# Patient Record
Sex: Female | Born: 1945
Health system: Southern US, Community
[De-identification: ages and names within clinical notes are randomized; demographics above are authoritative.]

## PROBLEM LIST (undated history)

## (undated) DIAGNOSIS — E785 Hyperlipidemia, unspecified: Secondary | ICD-10-CM

## (undated) DIAGNOSIS — Z87440 Personal history of urinary (tract) infections: Secondary | ICD-10-CM

## (undated) DIAGNOSIS — Z85828 Personal history of other malignant neoplasm of skin: Secondary | ICD-10-CM

## (undated) DIAGNOSIS — I10 Essential (primary) hypertension: Secondary | ICD-10-CM

## (undated) DIAGNOSIS — E669 Obesity, unspecified: Secondary | ICD-10-CM

## (undated) DIAGNOSIS — N8110 Cystocele, unspecified: Secondary | ICD-10-CM

## (undated) HISTORY — DX: Essential (primary) hypertension: I10

## (undated) HISTORY — DX: Personal history of other malignant neoplasm of skin: Z85.828

## (undated) HISTORY — DX: Personal history of urinary (tract) infections: Z87.440

## (undated) HISTORY — DX: Cystocele, unspecified: N81.10

## (undated) HISTORY — PX: OTHER SURGICAL HISTORY: SHX169

## (undated) HISTORY — DX: Obesity, unspecified: E66.9

## (undated) HISTORY — DX: Hyperlipidemia, unspecified: E78.5

---

## 2001-01-19 ENCOUNTER — Encounter: Payer: Self-pay | Admitting: Podiatry

## 2001-01-26 ENCOUNTER — Ambulatory Visit (HOSPITAL_COMMUNITY): Admission: RE | Admit: 2001-01-26 | Discharge: 2001-01-26 | Payer: Self-pay | Admitting: Podiatry

## 2001-01-26 ENCOUNTER — Encounter: Payer: Self-pay | Admitting: Podiatry

## 2001-03-24 ENCOUNTER — Encounter: Payer: Self-pay | Admitting: Internal Medicine

## 2001-03-24 ENCOUNTER — Ambulatory Visit (HOSPITAL_COMMUNITY): Admission: RE | Admit: 2001-03-24 | Discharge: 2001-03-24 | Payer: Self-pay | Admitting: Internal Medicine

## 2001-11-23 ENCOUNTER — Other Ambulatory Visit: Admission: RE | Admit: 2001-11-23 | Discharge: 2001-11-23 | Payer: Self-pay | Admitting: Dermatology

## 2002-07-21 ENCOUNTER — Encounter: Payer: Self-pay | Admitting: Internal Medicine

## 2002-07-21 ENCOUNTER — Ambulatory Visit (HOSPITAL_COMMUNITY): Admission: RE | Admit: 2002-07-21 | Discharge: 2002-07-21 | Payer: Self-pay | Admitting: Internal Medicine

## 2003-08-03 ENCOUNTER — Ambulatory Visit (HOSPITAL_COMMUNITY): Admission: RE | Admit: 2003-08-03 | Discharge: 2003-08-03 | Payer: Self-pay | Admitting: Family Medicine

## 2004-12-10 ENCOUNTER — Ambulatory Visit (HOSPITAL_COMMUNITY): Admission: RE | Admit: 2004-12-10 | Discharge: 2004-12-10 | Payer: Self-pay | Admitting: Family Medicine

## 2005-08-20 ENCOUNTER — Ambulatory Visit: Payer: Self-pay | Admitting: Family Medicine

## 2005-10-14 ENCOUNTER — Encounter: Payer: Self-pay | Admitting: Family Medicine

## 2006-01-21 ENCOUNTER — Ambulatory Visit (HOSPITAL_COMMUNITY): Admission: RE | Admit: 2006-01-21 | Discharge: 2006-01-21 | Payer: Self-pay | Admitting: Family Medicine

## 2006-06-06 ENCOUNTER — Ambulatory Visit (HOSPITAL_COMMUNITY): Admission: RE | Admit: 2006-06-06 | Discharge: 2006-06-06 | Payer: Self-pay | Admitting: Internal Medicine

## 2006-06-06 ENCOUNTER — Encounter: Payer: Self-pay | Admitting: Family Medicine

## 2006-06-06 ENCOUNTER — Ambulatory Visit: Payer: Self-pay | Admitting: Internal Medicine

## 2006-06-06 ENCOUNTER — Encounter (INDEPENDENT_AMBULATORY_CARE_PROVIDER_SITE_OTHER): Payer: Self-pay | Admitting: Specialist

## 2006-09-01 ENCOUNTER — Encounter: Payer: Self-pay | Admitting: Family Medicine

## 2006-09-01 ENCOUNTER — Other Ambulatory Visit: Admission: RE | Admit: 2006-09-01 | Discharge: 2006-09-01 | Payer: Self-pay | Admitting: Family Medicine

## 2006-09-01 ENCOUNTER — Ambulatory Visit: Payer: Self-pay | Admitting: Family Medicine

## 2006-10-15 ENCOUNTER — Ambulatory Visit: Payer: Self-pay | Admitting: Family Medicine

## 2007-01-23 ENCOUNTER — Ambulatory Visit (HOSPITAL_COMMUNITY): Admission: RE | Admit: 2007-01-23 | Discharge: 2007-01-23 | Payer: Self-pay | Admitting: Family Medicine

## 2007-08-06 ENCOUNTER — Encounter: Payer: Self-pay | Admitting: Family Medicine

## 2007-09-03 ENCOUNTER — Ambulatory Visit: Payer: Self-pay | Admitting: Family Medicine

## 2007-09-03 ENCOUNTER — Ambulatory Visit (HOSPITAL_COMMUNITY): Admission: RE | Admit: 2007-09-03 | Discharge: 2007-09-03 | Payer: Self-pay | Admitting: Family Medicine

## 2007-09-03 ENCOUNTER — Other Ambulatory Visit: Admission: RE | Admit: 2007-09-03 | Discharge: 2007-09-03 | Payer: Self-pay | Admitting: Family Medicine

## 2007-09-03 ENCOUNTER — Encounter: Payer: Self-pay | Admitting: Family Medicine

## 2007-11-09 ENCOUNTER — Ambulatory Visit: Payer: Self-pay | Admitting: Family Medicine

## 2007-11-10 ENCOUNTER — Encounter: Payer: Self-pay | Admitting: Family Medicine

## 2008-02-09 ENCOUNTER — Ambulatory Visit (HOSPITAL_COMMUNITY): Admission: RE | Admit: 2008-02-09 | Discharge: 2008-02-09 | Payer: Self-pay | Admitting: Family Medicine

## 2008-02-19 DIAGNOSIS — E785 Hyperlipidemia, unspecified: Secondary | ICD-10-CM | POA: Insufficient documentation

## 2008-02-19 DIAGNOSIS — N329 Bladder disorder, unspecified: Secondary | ICD-10-CM | POA: Insufficient documentation

## 2008-03-10 ENCOUNTER — Encounter: Payer: Self-pay | Admitting: Family Medicine

## 2008-03-10 LAB — CONVERTED CEMR LAB
Alkaline Phosphatase: 56 units/L (ref 39–117)
BUN: 20 mg/dL (ref 6–23)
Basophils Absolute: 0 10*3/uL (ref 0.0–0.1)
Basophils Relative: 1 % (ref 0–1)
Bilirubin, Direct: 0.1 mg/dL (ref 0.0–0.3)
CO2: 24 meq/L (ref 19–32)
Cholesterol: 223 mg/dL — ABNORMAL HIGH (ref 0–200)
Creatinine, Ser: 0.96 mg/dL (ref 0.40–1.20)
Eosinophils Absolute: 0.1 10*3/uL (ref 0.0–0.7)
Eosinophils Relative: 2 % (ref 0–5)
Glucose, Bld: 84 mg/dL (ref 70–99)
HDL: 50 mg/dL (ref >39)
Hemoglobin: 13.3 g/dL (ref 12.0–15.0)
Lymphs Abs: 1.9 10*3/uL (ref 0.7–4.0)
MCV: 99.5 fL (ref 78.0–100.0)
Monocytes Absolute: 0.4 10*3/uL (ref 0.1–1.0)
Monocytes Relative: 8 % (ref 3–12)
Neutro Abs: 2.5 10*3/uL (ref 1.7–7.7)
Neutrophils Relative %: 50 % (ref 43–77)
Potassium: 4.9 meq/L (ref 3.5–5.3)
Total Bilirubin: 0.6 mg/dL (ref 0.3–1.2)
Total CHOL/HDL Ratio: 4.5
Triglycerides: 103 mg/dL (ref <150)
WBC: 5 10*3/uL (ref 4.0–10.5)

## 2008-03-14 ENCOUNTER — Ambulatory Visit: Payer: Self-pay | Admitting: Family Medicine

## 2008-06-10 ENCOUNTER — Encounter: Payer: Self-pay | Admitting: Family Medicine

## 2008-06-13 LAB — CONVERTED CEMR LAB
ALT: 13 units/L (ref 0–35)
Alkaline Phosphatase: 50 units/L (ref 39–117)
Cholesterol: 162 mg/dL (ref 0–200)
Indirect Bilirubin: 0.5 mg/dL (ref 0.0–0.9)
Total Bilirubin: 0.6 mg/dL (ref 0.3–1.2)
Total Protein: 6.4 g/dL (ref 6.0–8.3)
VLDL: 19 mg/dL (ref 0–40)

## 2008-08-22 ENCOUNTER — Ambulatory Visit: Payer: Self-pay | Admitting: Family Medicine

## 2008-08-29 ENCOUNTER — Ambulatory Visit (HOSPITAL_COMMUNITY): Admission: RE | Admit: 2008-08-29 | Discharge: 2008-08-29 | Payer: Self-pay | Admitting: Family Medicine

## 2008-08-31 ENCOUNTER — Telehealth: Payer: Self-pay | Admitting: Family Medicine

## 2008-10-24 ENCOUNTER — Encounter: Payer: Self-pay | Admitting: Family Medicine

## 2008-11-02 ENCOUNTER — Encounter: Payer: Self-pay | Admitting: Family Medicine

## 2008-11-14 ENCOUNTER — Ambulatory Visit: Payer: Self-pay | Admitting: Family Medicine

## 2008-11-14 ENCOUNTER — Other Ambulatory Visit: Admission: RE | Admit: 2008-11-14 | Discharge: 2008-11-14 | Payer: Self-pay | Admitting: Family Medicine

## 2008-11-14 ENCOUNTER — Encounter: Payer: Self-pay | Admitting: Family Medicine

## 2008-11-14 LAB — CONVERTED CEMR LAB: OCCULT 1: NEGATIVE

## 2008-11-15 ENCOUNTER — Ambulatory Visit (HOSPITAL_COMMUNITY): Admission: RE | Admit: 2008-11-15 | Discharge: 2008-11-15 | Payer: Self-pay | Admitting: Family Medicine

## 2009-02-13 ENCOUNTER — Ambulatory Visit (HOSPITAL_COMMUNITY): Admission: RE | Admit: 2009-02-13 | Discharge: 2009-02-13 | Payer: Self-pay | Admitting: Family Medicine

## 2009-07-13 ENCOUNTER — Ambulatory Visit: Payer: Self-pay | Admitting: Family Medicine

## 2009-07-25 ENCOUNTER — Encounter: Payer: Self-pay | Admitting: Family Medicine

## 2009-07-27 DIAGNOSIS — E559 Vitamin D deficiency, unspecified: Secondary | ICD-10-CM | POA: Insufficient documentation

## 2009-07-27 LAB — CONVERTED CEMR LAB
Cholesterol: 202 mg/dL — ABNORMAL HIGH (ref 0–200)
Total CHOL/HDL Ratio: 3.7
Triglycerides: 108 mg/dL (ref <150)
VLDL: 22 mg/dL (ref 0–40)
Vit D, 25-Hydroxy: 24 ng/mL — ABNORMAL LOW (ref 30–89)

## 2009-08-26 HISTORY — PX: EYE SURGERY: SHX253

## 2009-09-12 ENCOUNTER — Encounter: Payer: Self-pay | Admitting: Family Medicine

## 2010-02-13 LAB — CONVERTED CEMR LAB
Cholesterol: 200 mg/dL (ref 0–200)
HDL: 56 mg/dL (ref >39)
LDL Cholesterol: 124 mg/dL — ABNORMAL HIGH (ref 0–99)
VLDL: 20 mg/dL (ref 0–40)

## 2010-02-15 ENCOUNTER — Ambulatory Visit (HOSPITAL_COMMUNITY): Admission: RE | Admit: 2010-02-15 | Discharge: 2010-02-15 | Payer: Self-pay | Admitting: Family Medicine

## 2010-02-19 ENCOUNTER — Other Ambulatory Visit: Admission: RE | Admit: 2010-02-19 | Discharge: 2010-02-19 | Payer: Self-pay | Admitting: Family Medicine

## 2010-02-19 ENCOUNTER — Ambulatory Visit: Payer: Self-pay | Admitting: Family Medicine

## 2010-02-19 DIAGNOSIS — E66811 Obesity, class 1: Secondary | ICD-10-CM | POA: Insufficient documentation

## 2010-02-19 DIAGNOSIS — R5383 Other fatigue: Secondary | ICD-10-CM

## 2010-02-19 DIAGNOSIS — E669 Obesity, unspecified: Secondary | ICD-10-CM | POA: Insufficient documentation

## 2010-02-19 DIAGNOSIS — R5381 Other malaise: Secondary | ICD-10-CM | POA: Insufficient documentation

## 2010-02-19 LAB — CONVERTED CEMR LAB: OCCULT 1: NEGATIVE

## 2010-02-21 ENCOUNTER — Encounter: Payer: Self-pay | Admitting: Family Medicine

## 2010-02-21 LAB — CONVERTED CEMR LAB: Pap Smear: NEGATIVE

## 2010-02-27 ENCOUNTER — Telehealth: Payer: Self-pay | Admitting: Family Medicine

## 2010-03-07 ENCOUNTER — Ambulatory Visit (HOSPITAL_COMMUNITY): Admission: RE | Admit: 2010-03-07 | Discharge: 2010-03-07 | Payer: Self-pay | Admitting: Family Medicine

## 2010-03-22 IMAGING — CR DG CHEST 2V
2 series · 2 of 2 positions shown · non-contrast
Comparison: 09/03/2007

CLINICAL DATA: Cough, congestion, mild wheezing, acute bronchitis

CHEST - 2 VIEW

[view not recorded (1 of 2)]
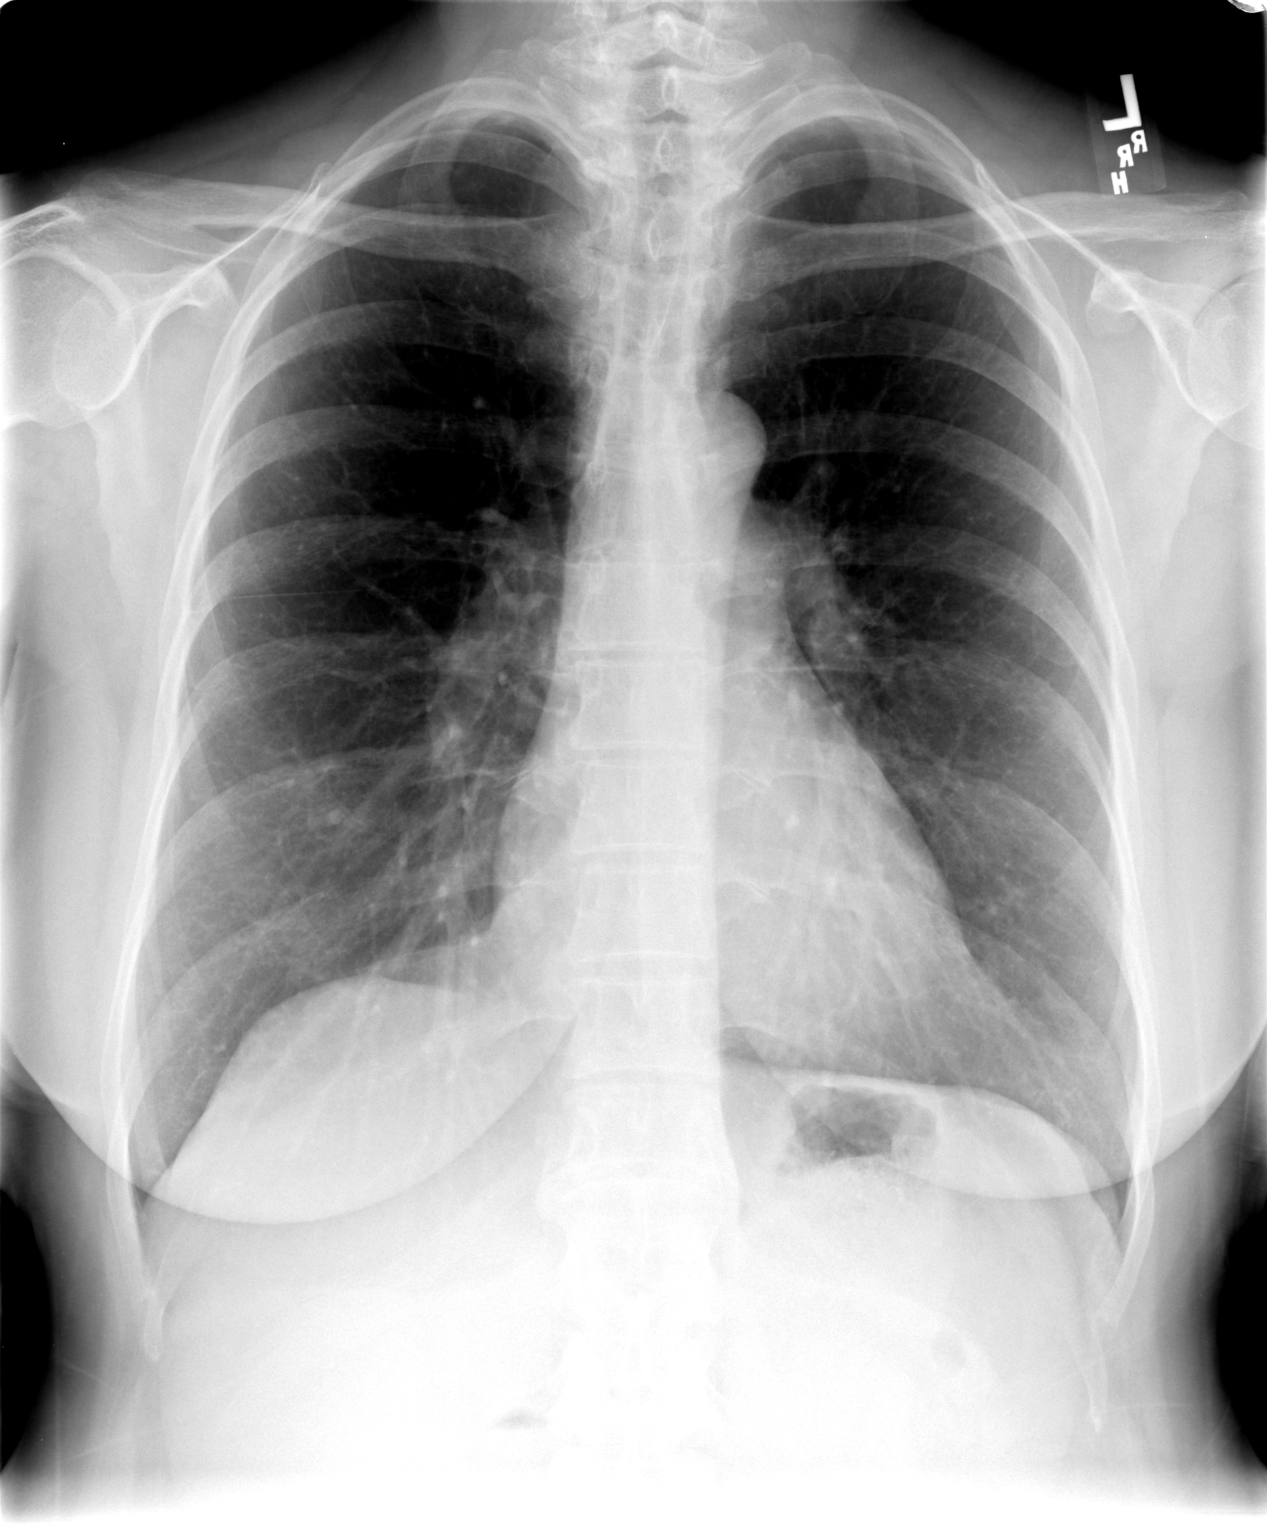

[view not recorded (2 of 2)]
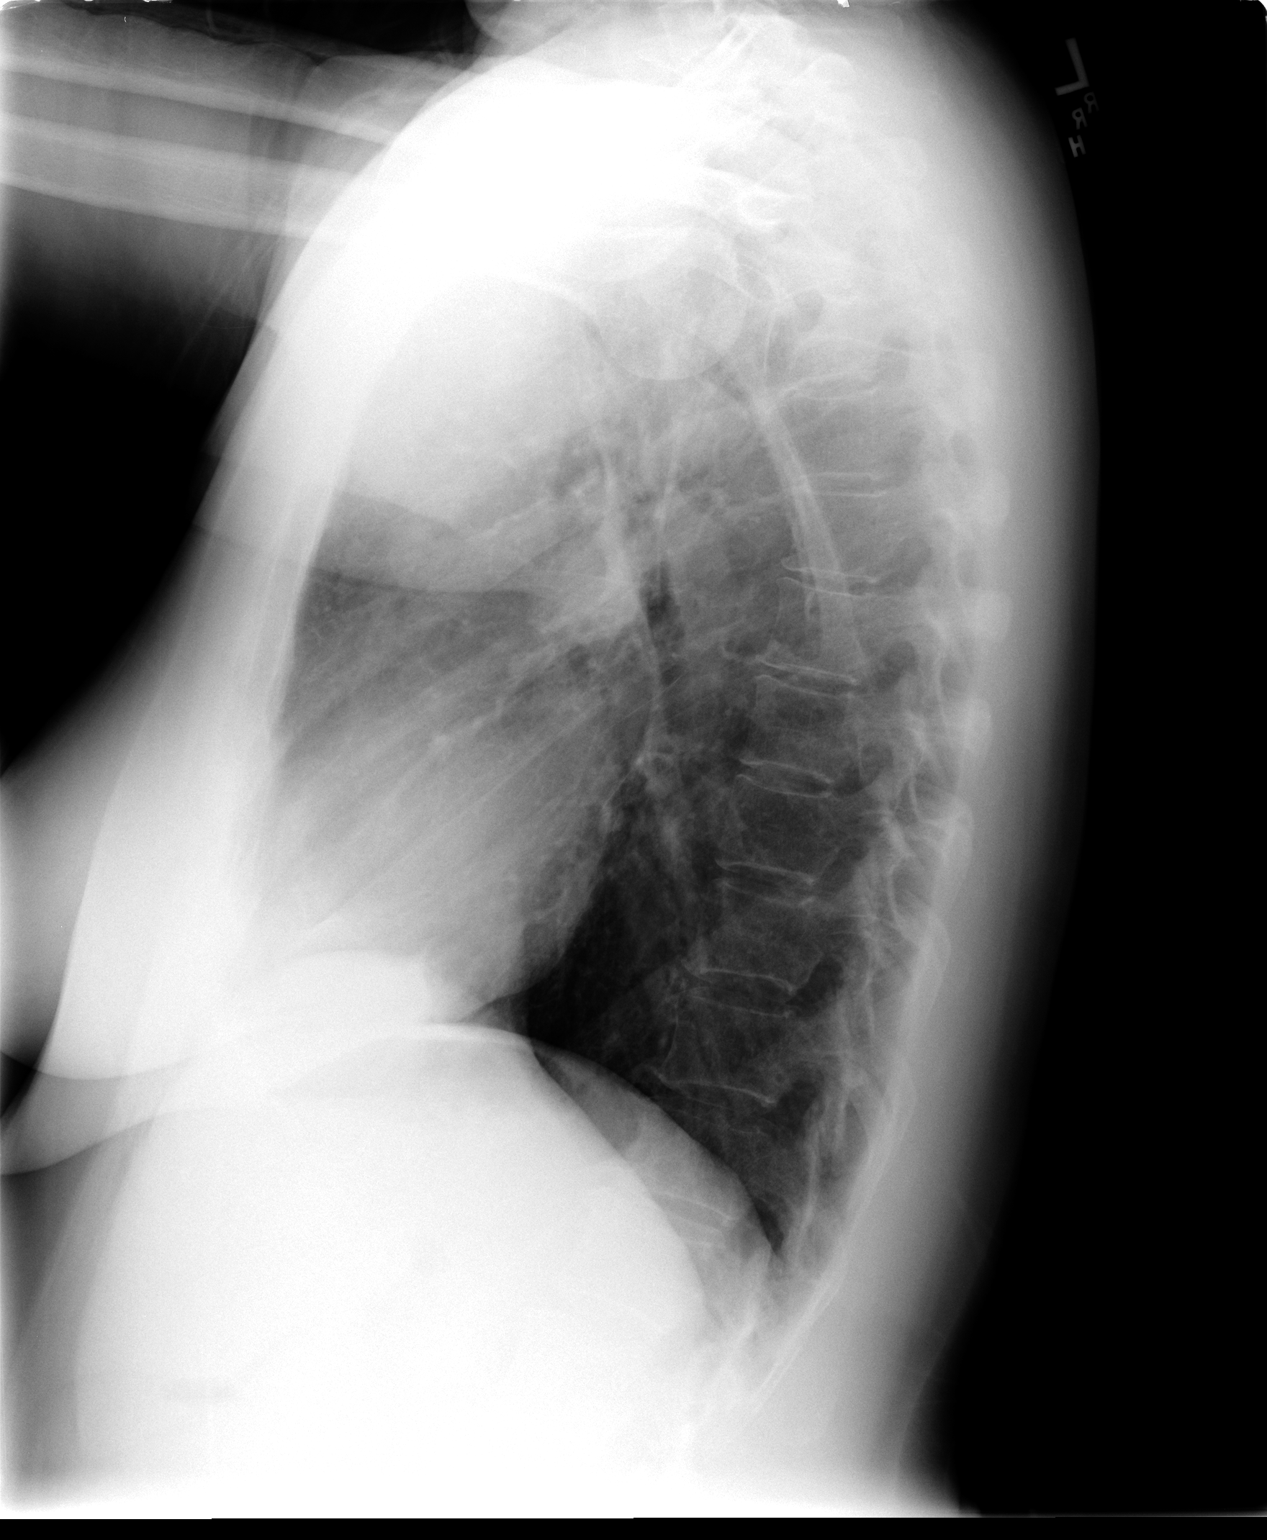

[2 of 2 positions shown; findings below may reference images not displayed]

FINDINGS: Upper normal heart size.
Normal mediastinal contours and pulmonary vascularity.
Minimally hyperexpanded lungs without pulmonary infiltrate or
pleural effusion.
Minimal peribronchial thickening.
No pneumothorax or focal bony abnormality.
IMPRESSION: Question minimal chronic bronchitic changes.

## 2010-08-26 HISTORY — PX: CATARACT EXTRACTION: SUR2

## 2010-09-16 ENCOUNTER — Encounter: Payer: Self-pay | Admitting: Family Medicine

## 2010-09-23 LAB — CONVERTED CEMR LAB
Basophils Relative: 1 % (ref 0–1)
Eosinophils Absolute: 0.1 10*3/uL (ref 0.0–0.7)
Eosinophils Relative: 1 % (ref 0–5)
Lymphocytes Relative: 35 % (ref 12–46)
Lymphs Abs: 2.3 10*3/uL (ref 0.7–4.0)
MCV: 98.6 fL (ref 78.0–100.0)
Pap Smear: NORMAL
RBC: 4.33 M/uL (ref 3.87–5.11)
RDW: 13 % (ref 11.5–15.5)

## 2010-09-25 NOTE — Progress Notes (Signed)
Summary: NEW PATIENT / FRIEND  Phone Note Call from Patient   Summary of Call: SHE HAS A FREIND WHO HAS MOVED HER HER HUSBAND IS THE NEW PRESIDENT @ [employer] NO SERIOUS HEALTH PROBLEMS BCBS INS  AND Vivien WANTS TO KNOW WOULD YOU MAKE AN ACCEPTION TO TAKE HER ON AS A NEW PATIENT  CALL BACK AT [phone] Initial call taken by: Lind Guest,  February 27, 2010 1:28 PM  Follow-up for Phone Call        ok to sched her as  new pt when appt available to se me, I think in 4 tio 6 weeks should be fine, let her know thart the friend should call for an appt after you get the name Follow-up by: Syliva Overman MD,  February 27, 2010 5:30 PM  Additional Follow-up for Phone Call Additional follow up Details #1::        [facility] Additional Follow-up by: Lind Guest,  February 28, 2010 11:02 AM    Additional Follow-up for Phone Call Additional follow up Details #2::    SHE IS SCHEDULED Follow-up by: Lind Guest,  February 28, 2010 1:57 PM

## 2010-09-25 NOTE — Op Note (Signed)
Summary: COLONOSCOPY WITH POLYPECTOMY  COLONOSCOPY WITH POLYPECTOMY   Imported By: Lind Guest 02/21/2010 14:32:12  _____________________________________________________________________  External Attachment:    Type:   Image     Comment:   External Document

## 2010-09-25 NOTE — Letter (Signed)
Summary: Letter  Letter   Imported By: Lind Guest 02/22/2010 08:09:50  _____________________________________________________________________  External Attachment:    Type:   Image     Comment:   External Document

## 2010-09-25 NOTE — Assessment & Plan Note (Signed)
Summary: physical   Vital Signs:  Patient profile:   65 year old female Menstrual status:  postmenopausal Height:      62.5 inches Weight:      150.75 pounds BMI:     27.23 O2 Sat:      98 % on Room air Pulse rate:   70 / minute Resp:     16 per minute BP sitting:   110 / 76  (left arm) Cuff size:   regular  Vitals Entered By: Everitt Amber LPN (February 19, 2010 10:08 AM)  Nutrition Counseling: Patient's BMI is greater than 25 and therefore counseled on weight management options.  O2 Flow:  Room air CC: CPE  Vision Screening:Left eye w/o correction: 20 / 20 Right Eye w/o correction: 20 / 20 Both eyes w/o correction:  20/ 20  Color vision testing: normal      Vision Entered By: Everitt Amber LPN (February 19, 2010 10:09 AM)   Primary Care Provider:  simpson, margaret  CC:  CPE.  History of Present Illness: Reports  that she has been doing extremel;y well, esp since her partener has become a part of her dietary modification. Denies recent fever or chills. Denies sinus pressure, nasal congestion , ear pain or sore throat. Denies chest congestion, or cough productive of sputum. Denies chest pain, palpitations, PND, orthopnea or leg swelling. Denies abdominal pain, nausea, vomitting, diarrhea or constipation. Denies change in bowel movements or bloody stool. Denies dysuria , frequency, incontinence or hesitancy. Denies  joint pain, swelling, or reduced mobility. Denies headaches, vertigo, seizures. Denies depression, anxiety or insomnia. Denies  rash, lesions, or itch.She has had a derm eval earlier this year with no suspiscious lesios identified.n     Current Medications (verified): 1)  Fish Oil   Oil (Fish Oil) .... One Tab By Mouth Bid  Allergies (verified): 1)  ! Sulfa  Past History:  Past Surgical History: Left Bunionectomy-2002 Squamous cell Ca Rt ant chest-1989 bilateral cataract extraction and implantation of both lenses, 2011 (Dr Nile Riggs)  Review of  Systems      See HPI Eyes:  Complains of vision loss-both eyes; denies blurring, discharge, light sensitivity, and red eye; recent bilateral cataract extraction with lens implants, successful. Psych:  Denies anxiety and depression. Endo:  Denies cold intolerance, excessive hunger, excessive thirst, excessive urination, heat intolerance, polyuria, and weight change. Heme:  Denies abnormal bruising and bleeding. Allergy:  Denies hives or rash and itching eyes.  Physical Exam  General:  Well-developed,well-nourished,in no acute distress; alert,appropriate and cooperative throughout examination Head:  Normocephalic and atraumatic without obvious abnormalities. No apparent alopecia or balding. Eyes:  No corneal or conjunctival inflammation noted. EOMI. Perrla. Funduscopic exam benign, without hemorrhages, exudates or papilledema. Vision grossly normal. Ears:  External ear exam shows no significant lesions or deformities.  Otoscopic examination reveals clear canals, tympanic membranes are intact bilaterally without bulging, retraction, inflammation or discharge. Hearing is grossly normal bilaterally. Nose:  External nasal examination shows no deformity or inflammation. Nasal mucosa are pink and moist without lesions or exudates. Mouth:  Oral mucosa and oropharynx without lesions or exudates.  Teeth in good repair. Neck:  No deformities, masses, or tenderness noted. Chest Wall:  No deformities, masses, or tenderness noted. Breasts:  No mass, nodules, thickening, tenderness, bulging, retraction, inflamation, nipple discharge or skin changes noted.   Lungs:  Normal respiratory effort, chest expands symmetrically. Lungs are clear to auscultation, no crackles or wheezes. Heart:  Normal rate and regular rhythm. S1 and  S2 normal without gallop, murmur, click, rub or other extra sounds. Abdomen:  Bowel sounds positive,abdomen soft and non-tender without masses, organomegaly or hernias noted. Rectal:  No  external abnormalities noted. Normal sphincter tone. No rectal masses or tenderness.guaic neg stool Genitalia:  Normal introitus for age, no external lesions, no vaginal discharge, mucosa pink and moist, no vaginal or cervical lesions, no vaginal atrophy, no friaility or hemorrhage, normal uterus size and position, no adnexal masses or tenderness. positive bladder prolapse. Msk:  No deformity or scoliosis noted of thoracic or lumbar spine.   Pulses:  R and L carotid,radial,femoral,dorsalis pedis and posterior tibial pulses are full and equal bilaterally Extremities:  No clubbing, cyanosis, edema, or deformity noted with normal full range of motion of all joints.   Neurologic:  No cranial nerve deficits noted. Station and gait are normal. Plantar reflexes are down-going bilaterally. DTRs are symmetrical throughout. Sensory, motor and coordinative functions appear intact. Skin:  Intact without suspicious lesions or rashes Cervical Nodes:  No lymphadenopathy noted Axillary Nodes:  No palpable lymphadenopathy Inguinal Nodes:  No significant adenopathy Psych:  Cognition and judgment appear intact. Alert and cooperative with normal attention span and concentration. No apparent delusions, illusions, hallucinations   Impression & Recommendations:  Problem # 1:  PHYSICAL EXAMINATION (ICD-V70.0) Assessment Comment Only routine health maintainance issues discussed, pt to commit to daily aspirin and calcium with D. Continue healthy diet and regular exercise  Problem # 2:  OVERWEIGHT (ICD-278.02) Assessment: Improved  Ht: 62.5 (02/19/2010)   Wt: 150.75 (02/19/2010)   BMI: 27.23 (02/19/2010)  Complete Medication List: 1)  Fish Oil Oil (Fish oil) .... One tab by mouth bid  Other Orders: T-Basic Metabolic Panel [phone]) T-Lipid Profile [id number]) T-CBC w/Diff [phone]) T-TSH [phone]) Pap Smear (28413) Hemoccult Guaiac-1 spec.(in office) (82270) T-Basic Metabolic Panel  ([id number]) T-Lipid Profile [id number]) T- Hemoglobin A1C ([id number]) Radiology Referral (Radiology)  Patient Instructions: 1)  CPE in 12 months. 2)  You are doing exceptionally well.Pls keep it up. 3)  It is important that you exercise regularly at least 30 minutes 5 times a week. If you develop chest pain, have severe difficulty breathing, or feel very tired , stop exercising immediately and seek medical attention. 4)  You need to lose weight. Consider a lower calorie diet and regular exercise.Copngrats on the weight l;oss, you have lost 11 pounds. 5)  Pls start daily calcium with d 1200mg /1000IU is recomended 6)  Pls start one asprin 81 mg daily. 7)  Pls consider the once daily multivitamin 8)  We will research your colonscopy and call you back on this, also on the dexa 9)  BMP prior to visit, ICD-9:CBC, and TSH 10)  Lipid Panel prior to visit, ICD-9:  fasting labs next March 11)  Pls remmber to get your flu vacc in Sep/oct , also your pneumionia vaccine is due at age 76 12)  TSH prior to visit, ICD-9: 13)  CBC w/ Diff prior to visit, ICD-9:   Preventive Care Screening  Colonoscopy:    Date:  06/06/2006    Results:  Diverticulosis   Bone Density:    Date:  10/14/2005    Results:  normal std dev     with a sigmoid polyp. Pathology (tubular adeoma)     Preventive Care Screening  Colonoscopy:    Date:  06/06/2006    Results:  Diverticulosis   Bone Density:    Date:  10/14/2005    Results:  normal std dev     with  a sigmoid polyp. Pathology (tubular adeoma)   Laboratory Results    Stool - Occult Blood Hemmoccult #1: negative Date: 02/19/2010 Comments: 50201 9L 10/12 118/10/12

## 2010-09-25 NOTE — Miscellaneous (Signed)
Summary: flu shot  Clinical Lists Changes  Observations: Added new observation of FLU VAX: Historical (06/06/2009 15:38)      Influenza Immunization History:    Influenza # 1:  Historical (06/06/2009)

## 2010-10-25 ENCOUNTER — Encounter: Payer: Self-pay | Admitting: Family Medicine

## 2010-12-17 ENCOUNTER — Ambulatory Visit: Payer: Self-pay | Admitting: Family Medicine

## 2010-12-27 ENCOUNTER — Encounter: Payer: Self-pay | Admitting: Family Medicine

## 2010-12-31 ENCOUNTER — Ambulatory Visit (INDEPENDENT_AMBULATORY_CARE_PROVIDER_SITE_OTHER): Payer: MEDICARE | Admitting: Family Medicine

## 2010-12-31 ENCOUNTER — Encounter: Payer: Self-pay | Admitting: Family Medicine

## 2010-12-31 VITALS — BP 136/80 | HR 97 | Resp 16 | Ht 62.75 in | Wt 157.1 lb

## 2010-12-31 DIAGNOSIS — E663 Overweight: Secondary | ICD-10-CM

## 2010-12-31 DIAGNOSIS — E785 Hyperlipidemia, unspecified: Secondary | ICD-10-CM

## 2010-12-31 DIAGNOSIS — R5383 Other fatigue: Secondary | ICD-10-CM

## 2010-12-31 DIAGNOSIS — N329 Bladder disorder, unspecified: Secondary | ICD-10-CM

## 2010-12-31 DIAGNOSIS — R5381 Other malaise: Secondary | ICD-10-CM

## 2010-12-31 DIAGNOSIS — Z23 Encounter for immunization: Secondary | ICD-10-CM

## 2010-12-31 DIAGNOSIS — Z1211 Encounter for screening for malignant neoplasm of colon: Secondary | ICD-10-CM

## 2010-12-31 NOTE — Patient Instructions (Signed)
CPE in  January   Pls reduce fatty food intake , and also reduce your calories so you will lose 10 pounds  pls schedule your mammogram for end June  When due.   pls expect a call for rept colonscopy in October with dr Karilyn Cota  Pneumonia vaccine today   CBC , fasting chem7 , lipd and TSH in 1 year.

## 2011-01-01 ENCOUNTER — Encounter: Payer: Self-pay | Admitting: Family Medicine

## 2011-01-01 NOTE — Progress Notes (Signed)
  Subjective:    Patient ID: Yvonne Brewer, female    DOB: 25-Nov-1945, 65 y.o.   MRN: [id number]  HPI The PT is here for follow up and re-evaluation of chronic medical conditions, medication management and review of recent lab and radiology data.  Preventive health is updated, specifically  Cancer screening, Osteoporosis screening and Immunization.   Questions or concerns regarding consultations or procedures which the PT has had in the interim are  Addressed She had very sucesful bilateral eye surgery last year, and now no longer needs to wear glasses. The PT denies any adverse reactions to current medications since the last visit.  There are no new concerns.  There are no specific complaints except for the realization that she ha  Has gained weight through lack of diligence with diet, her lipids have also increased. She has now comited to increased exercise in group sessions, and is excited about this      Review of Systems Denies recent fever or chills. Denies sinus pressure, nasal congestion, ear pain or sore throat. Denies chest congestion, productive cough or wheezing. Denies chest pains, palpitations, paroxysmal nocturnal dyspnea, orthopnea and leg swelling Denies abdominal pain, nausea, vomiting,diarrhea or constipation.  Denies rectal bleeding or change in bowel movement. Denies dysuria, frequency, hesitancy or incontinence.Reports less symptomatic as far as prolapse is concened Denies joint pain, swelling and limitation in mobility. Denies headaches, seizure, numbness, or tingling. Denies depression, anxiety or insomnia. Denies skin break down or rash.       Objective:   Physical Exam    Patient alert and oriented and in no Cardiopulmonary distress.  HEENT: No facial asymmetry, EOMI, no sinus tenderness, TM's clear, Oropharynx pink and moist.  Neck supple no adenopathy.  Chest: Clear to auscultation bilaterally.  CVS: S1, S2 no murmurs, no S3.  ABD: Soft non  tender. Bowel sounds normal.  Ext: No edema  MS: Adequate ROM spine, shoulders, hips and knees.  Skin: Intact, no ulcerations or rash noted.  Psych: Good eye contact, normal affect. Memory intact not anxious or depressed appearing.  CNS: CN 2-12 intact, power, tone and sensation normal throughout.     Assessment & Plan:

## 2011-01-01 NOTE — Assessment & Plan Note (Signed)
Deteriorated, low fat diet and increased activity encouraged, plans to return to diet high in beans and fish oil

## 2011-01-01 NOTE — Assessment & Plan Note (Signed)
Deteriorated, will alter lifestyle to improve this

## 2011-01-01 NOTE — Assessment & Plan Note (Signed)
Less symptomatic

## 2011-01-11 NOTE — Op Note (Signed)
Yvonne Brewer, Yvonne Brewer             ACCOUNT NO.:  [id number]   MEDICAL RECORD NO.:  [id number]          PATIENT TYPE:  AMB   LOCATION:  DAY                           FACILITY:  [facility]   PHYSICIAN:  Lionel December, M.D.    DATE OF BIRTH:  May 24, 1946   DATE OF PROCEDURE:  06/06/2006  DATE OF DISCHARGE:  06/06/2006                                 OPERATIVE REPORT   PROCEDURE:  Colonoscopy with polypectomy.   INDICATIONS:  Yvonne Brewer is a 65 year old Caucasian female who is here for a  screening colonoscopy.  She had sigmoidoscopy in 2000.  While family history  is negative for colorectal cancer, her father has had multiple polyps  removed.   Procedure is reviewed with the patient, and informed consent is obtained.   MEDICATIONS FOR CONSCIOUS SEDATION:  Demerol 25 mg IV, Versed 2 mg IV.   FINDINGS:  Procedure performed in endoscopy suite.  Patient's vital signs  and O2 sat were monitored during the procedure and remained stable.  The  patient was placed in the left lateral decubitus position, and rectal  examination performed.  No abnormality noted on external or digital exam.  Olympus videoscope was placed in the rectum and advanced under direct vision  into the sigmoid colon and beyond.  Preparation was satisfactory.  She had a  moderate number of diverticula at the sigmoid colon and a few more at the  descending colon.  The scope was passed at the cecum, which was identified  by the appendiceal orifice and ileocecal valve.  Pictures were taken for the  record.  A short segment of TI was also examined and was normal.  The  colonic mucosa was carefully examined carefully on the way out. There was a  6 mm polyp located at the edge of the diverticulum at the sigmoid colon.  This was caught with the snare, pulled away from the diverticulum, and  polypectomy performed.  Polypectomy was completed.  This polyp was retrieved  for histologic examination.  The rest of the colon and rectum was  normal.  The scope was retroflexed and examined in the anorectal junction, which was  unremarkable.  The endoscope was straightened and withdrawn.  Patient  tolerated the procedure well.   FINAL DIAGNOSIS:  1. A 6 mm polyp, snared from the sigmoid colon, located at the edge of a      diverticulum.  2. Left colonic diverticulosis.   RECOMMENDATIONS:  Standard instructions given.  She will continue a high-  fiber diet plus fiber supplement, 3-4 gm per day.  I will be contacting the  patient with the results of biopsy and further recommendations.      Lionel December, M.D.  Electronically Signed     NR/MEDQ  D:  06/06/2006  T:  06/08/2006  Job:  [id number]   cc:   Milus Mallick. Lodema Hong, M.D.  Fax: [phone]

## 2011-02-25 ENCOUNTER — Other Ambulatory Visit: Payer: Self-pay | Admitting: Family Medicine

## 2011-02-25 DIAGNOSIS — Z139 Encounter for screening, unspecified: Secondary | ICD-10-CM

## 2011-02-26 ENCOUNTER — Ambulatory Visit (HOSPITAL_COMMUNITY)
Admission: RE | Admit: 2011-02-26 | Discharge: 2011-02-26 | Disposition: A | Discharge status: Home / Self Care | Payer: Medicare Other | Source: Ambulatory Visit | Attending: Family Medicine | Admitting: Family Medicine

## 2011-02-26 DIAGNOSIS — Z1231 Encounter for screening mammogram for malignant neoplasm of breast: Secondary | ICD-10-CM | POA: Insufficient documentation

## 2011-02-26 DIAGNOSIS — Z139 Encounter for screening, unspecified: Secondary | ICD-10-CM

## 2011-05-17 ENCOUNTER — Telehealth: Payer: Self-pay | Admitting: Family Medicine

## 2011-05-17 NOTE — Telephone Encounter (Signed)
Patient aware.

## 2011-05-29 ENCOUNTER — Telehealth (INDEPENDENT_AMBULATORY_CARE_PROVIDER_SITE_OTHER): Payer: Self-pay | Admitting: *Deleted

## 2011-05-29 NOTE — Telephone Encounter (Signed)
She is due TCS and will not be available to do anything until after 07/08/2011.  She is very hard to reach and says if you will call her with a tenative date and then she will call you back with the info you need.  [phone]

## 2011-06-04 ENCOUNTER — Encounter (INDEPENDENT_AMBULATORY_CARE_PROVIDER_SITE_OTHER): Payer: Self-pay | Admitting: *Deleted

## 2011-06-04 NOTE — Telephone Encounter (Signed)
Sent letter to patient letting her know it will be after 1st of year before we can get TCS sch'd

## 2011-07-01 ENCOUNTER — Other Ambulatory Visit (INDEPENDENT_AMBULATORY_CARE_PROVIDER_SITE_OTHER): Payer: Self-pay | Admitting: *Deleted

## 2011-07-01 ENCOUNTER — Encounter (INDEPENDENT_AMBULATORY_CARE_PROVIDER_SITE_OTHER): Payer: Self-pay | Admitting: *Deleted

## 2011-07-01 DIAGNOSIS — Z86010 Personal history of colonic polyps: Secondary | ICD-10-CM

## 2011-08-14 ENCOUNTER — Telehealth (INDEPENDENT_AMBULATORY_CARE_PROVIDER_SITE_OTHER): Payer: Self-pay | Admitting: *Deleted

## 2011-08-14 NOTE — Telephone Encounter (Signed)
PCP/Requesting MD:  Syliva Overman  Name: Yvonne Brewer  DOB: [birth year]   Procedure: TCS  Reason/Indication:  HX POLYPS  Has patient had this procedure before?  YES  If so, when, by whom and where?  5 YRS AGO  Is there a family history of colon cancer?  NO  Who?  What age when diagnosed?    Is patient diabetic?   NO      Does patient have prosthetic heart valve?  NO  Do you have a pacemaker?  NO  Has patient had joint replacement within last 12 months?  NO  Is patient on Coumadin, Plavix and/or Aspirin? NO  Medications: FISH OIL SUPPLEMENT  Allergies: SULFUR DRUGS  Pharmacy:   Medication Adjustment: NONE  Procedure date & time: 08/29/11 @ 730

## 2011-08-16 NOTE — Telephone Encounter (Signed)
approved

## 2011-08-21 ENCOUNTER — Encounter (HOSPITAL_COMMUNITY): Payer: Self-pay

## 2011-08-26 ENCOUNTER — Encounter: Payer: Self-pay | Admitting: Family Medicine

## 2011-09-04 ENCOUNTER — Encounter: Payer: Self-pay | Admitting: Family Medicine

## 2011-09-09 ENCOUNTER — Encounter: Payer: MEDICARE | Admitting: Family Medicine

## 2011-10-08 ENCOUNTER — Telehealth (INDEPENDENT_AMBULATORY_CARE_PROVIDER_SITE_OTHER): Payer: Self-pay | Admitting: *Deleted

## 2011-10-08 NOTE — Telephone Encounter (Signed)
agree

## 2011-10-08 NOTE — Telephone Encounter (Signed)
Requesting MD/PCP:  simpson     Name & DOB: Yvonne Brewer [birth year]      Procedure: tcs  Reason/Indication:  Hx polyps  Has patient had this procedure before?  yes  If so, when, by whom and where?  5 yrs ago  Is there a family history of colon cancer?  no  Who?  What age when diagnosed?    Is patient diabetic?   no      Does patient have prosthetic heart valve?  no  Do you have a pacemaker?  no  Has patient had joint replacement within last 12 months?  no  Is patient on Coumadin, Plavix and/or Aspirin? no  Medications: fish oil   Allergies: sulfur drugs  Pharmacy:   Medication Adjustment: none  Procedure date & time: 11/06/11 at 830

## 2011-11-05 MED ORDER — SODIUM CHLORIDE 0.45 % IV SOLN
Freq: Once | INTRAVENOUS | Status: AC
  Administered 2011-11-06: 08:00:00 via INTRAVENOUS

## 2011-11-06 ENCOUNTER — Ambulatory Visit (HOSPITAL_COMMUNITY)
Admission: RE | Admit: 2011-11-06 | Discharge: 2011-11-06 | Disposition: A | Discharge status: Home / Self Care | Payer: Medicare Other | Source: Ambulatory Visit | Attending: Internal Medicine | Admitting: Internal Medicine

## 2011-11-06 ENCOUNTER — Encounter (HOSPITAL_COMMUNITY)
Admission: RE | Disposition: A | Discharge status: Home / Self Care | Payer: Self-pay | Source: Ambulatory Visit | Attending: Internal Medicine

## 2011-11-06 ENCOUNTER — Encounter (HOSPITAL_COMMUNITY): Payer: Self-pay

## 2011-11-06 DIAGNOSIS — Z860100 Personal history of colon polyps, unspecified: Secondary | ICD-10-CM | POA: Insufficient documentation

## 2011-11-06 DIAGNOSIS — K5730 Diverticulosis of large intestine without perforation or abscess without bleeding: Secondary | ICD-10-CM

## 2011-11-06 DIAGNOSIS — D126 Benign neoplasm of colon, unspecified: Secondary | ICD-10-CM | POA: Insufficient documentation

## 2011-11-06 DIAGNOSIS — I10 Essential (primary) hypertension: Secondary | ICD-10-CM | POA: Insufficient documentation

## 2011-11-06 DIAGNOSIS — Z79899 Other long term (current) drug therapy: Secondary | ICD-10-CM | POA: Insufficient documentation

## 2011-11-06 DIAGNOSIS — Z09 Encounter for follow-up examination after completed treatment for conditions other than malignant neoplasm: Secondary | ICD-10-CM

## 2011-11-06 DIAGNOSIS — E785 Hyperlipidemia, unspecified: Secondary | ICD-10-CM | POA: Insufficient documentation

## 2011-11-06 HISTORY — PX: COLONOSCOPY: SHX5424

## 2011-11-06 SURGERY — COLONOSCOPY
Anesthesia: Moderate Sedation

## 2011-11-06 MED ORDER — STERILE WATER FOR IRRIGATION IR SOLN
Status: DC | PRN
  Administered 2011-11-06: 08:00:00

## 2011-11-06 MED ORDER — MEPERIDINE HCL 50 MG/ML IJ SOLN
INTRAMUSCULAR | Status: DC | PRN
  Administered 2011-11-06: 25 mg via INTRAVENOUS

## 2011-11-06 MED ORDER — MIDAZOLAM HCL 5 MG/5ML IJ SOLN
INTRAMUSCULAR | Status: AC
  Filled 2011-11-06: qty 10

## 2011-11-06 MED ORDER — MIDAZOLAM HCL 5 MG/5ML IJ SOLN
INTRAMUSCULAR | Status: DC | PRN
  Administered 2011-11-06: 2 mg via INTRAVENOUS

## 2011-11-06 MED ORDER — MEPERIDINE HCL 50 MG/ML IJ SOLN
INTRAMUSCULAR | Status: AC
  Filled 2011-11-06: qty 1

## 2011-11-06 NOTE — Op Note (Signed)
COLONOSCOPY PROCEDURE REPORT  PATIENT:  ANEYA DADDONA  MR#:  [id number] Birthdate:  Aug 18, 1946, 66 y.o., female Endoscopist:  Dr. Malissa Hippo, MD Referred By:  Dr. Milus Mallick. Lodema Hong, MD Procedure Date: 11/06/2011  Procedure:   Colonoscopy with polypectomy.  Indications: Patient is a 66 year old Caucasian female history of colonic adenoma who is undergoing surveillance colonoscopy.  Informed Consent:   Procedure and risks were reviewed with the patient and informed consent was obtained.  Medications:  Demerol 25 mg IV Versed 2 mg IV  Description of procedure:  After a digital rectal exam was performed, that colonoscope was advanced from the anus through the rectum and colon to the area of the cecum, ileocecal valve and appendiceal orifice. The cecum was deeply intubated. These structures were well-seen and photographed for the record. From the level of the cecum and ileocecal valve, the scope was slowly and cautiously withdrawn. The mucosal surfaces were carefully surveyed utilizing scope tip to flexion to facilitate fold flattening as needed. The scope was pulled down into the rectum where a thorough exam including retroflexion was performed.  Findings:   Prep satisfactory. Single 8 x 5 mm sessile polyp at cecum. Saline-assisted polypectomy performed. Polyp was located behind the ileocecal valve. Scattered elliptical and sigmoid colon. Normal rectal mucosa and anal rectal junction.  Therapeutic/Diagnostic Maneuvers Performed:  See above  Complications:  None  Cecal Withdrawal Time:  16 minutes  Impression:  Examination performed to cecum. Moderate number of diverticula at sigmoid colon. 8 x 5 mm sessile polyp snared from cecum following submucosal saline injection.  Recommendations:  No aspirin for 10 days. I would be contacting the patient with results of biopsy and further recommendations.  Boby Eyer U  11/06/2011 9:05 AM  CC: Dr. Syliva Overman, MD, MD & Dr.  Bonnetta Barry ref. provider found

## 2011-11-06 NOTE — Discharge Instructions (Signed)
No aspirin for 10 days. High fiber diet. No driving for 24 hours. Physician will contact you with the biopsy results.High Fiber Diet A high fiber diet changes your normal diet to include more whole grains, legumes, fruits, and vegetables. Changes in the diet involve replacing refined carbohydrates with unrefined foods. The calorie level of the diet is essentially unchanged. The Dietary Reference Intake (recommended amount) for adult males is 38 g per day. For adult females, it is 25 g per day. Pregnant and lactating women should consume 28 g of fiber per day. Fiber is the intact part of a plant that is not broken down during digestion. Functional fiber is fiber that has been isolated from the plant to provide a beneficial effect in the body. PURPOSE  Increase stool bulk.   Ease and regulate bowel movements.   Lower cholesterol.  INDICATIONS THAT YOU NEED MORE FIBER  Constipation and hemorrhoids.   Uncomplicated diverticulosis (intestine condition) and irritable bowel syndrome.   Weight management.   As a protective measure against hardening of the arteries (atherosclerosis), diabetes, and cancer.  NOTE OF CAUTION If you have a digestive or bowel problem, ask your caregiver for advice before adding high fiber foods to your diet. Some of the following medical problems are such that a high fiber diet should not be used without consulting your caregiver:  Acute diverticulitis (intestine infection).   Partial small bowel obstructions.   Complicated diverticular disease involving bleeding, rupture (perforation), or abscess (boil, furuncle).   Presence of autonomic neuropathy (nerve damage) or gastric paresis (stomach cannot empty itself).  GUIDELINES FOR INCREASING FIBER  Start adding fiber to the diet slowly. A gradual increase of about 5 more grams (2 slices of whole-wheat bread, 2 servings of most fruits or vegetables, or 1 bowl of high fiber cereal) per day is best. Too rapid an  increase in fiber may result in constipation, flatulence, and bloating.   Drink enough water and fluids to keep your urine clear or pale yellow. Water, juice, or caffeine-free drinks are recommended. Not drinking enough fluid may cause constipation.   Eat a variety of high fiber foods rather than one type of fiber.   Try to increase your intake of fiber through using high fiber foods rather than fiber pills or supplements that contain small amounts of fiber.   The goal is to change the types of food eaten. Do not supplement your present diet with high fiber foods, but replace foods in your present diet.  INCLUDE A VARIETY OF FIBER SOURCES  Replace refined and processed grains with whole grains, canned fruits with fresh fruits, and incorporate other fiber sources. White rice, white breads, and most bakery goods contain little or no fiber.   Brown whole-grain rice, buckwheat oats, and many fruits and vegetables are all good sources of fiber. These include: broccoli, Brussels sprouts, cabbage, cauliflower, beets, sweet potatoes, white potatoes (skin on), carrots, tomatoes, eggplant, squash, berries, fresh fruits, and dried fruits.   Cereals appear to be the richest source of fiber. Cereal fiber is found in whole grains and bran. Bran is the fiber-rich outer coat of cereal grain, which is largely removed in refining. In whole-grain cereals, the bran remains. In breakfast cereals, the largest amount of fiber is found in those with "bran" in their names. The fiber content is sometimes indicated on the label.   You may need to include additional fruits and vegetables each day.   In baking, for 1 cup white flour, you may use  the following substitutions:   1 cup whole-wheat flour minus 2 tbs.    cup white flour plus  cup whole-wheat flour.  Document Released: 08/12/2005 Document Revised: 08/01/2011 Document Reviewed: 06/20/2009 [facility] Patient Information 2012 [place], [place].

## 2011-11-06 NOTE — H&P (Signed)
Yvonne Brewer is an 66 y.o. female.   Chief Complaint: Patient is here for colonoscopy. HPI: Patient is 66 year old Caucasian female who has history of colonic adenoma. Her last exam was in October 2007. She is in for surveillance colonoscopy. She denies abdominal pain change in bowel habits or rectal bleeding. Family history significant for  multiple colonic polyps in her father who lived to be in his 20s.  Past Medical History  Diagnosis Date  . Hypertension   . Hyperlipidemia   . Obesity   . Cystitis, unspecified   . Female bladder prolapse   . Cancer     skin cancer    Past Surgical History  Procedure Date  . Left bunionectomy  2002   . Squamous cell ca rt ant chest 1989   . Eye surgery 2011    bilateral cataract with implants    Family History  Problem Relation Age of Onset  . Stroke Mother   . Hypertension Mother   . Hypertension Father   . Cancer Maternal Grandmother   . Anesthesia problems Neg Hx   . Hypotension Neg Hx   . Malignant hyperthermia Neg Hx   . Pseudochol deficiency Neg Hx    Social History:  reports that she has never smoked. She does not have any smokeless tobacco history on file. She reports that she drinks alcohol. She reports that she does not use illicit drugs.  Allergies:  Allergies  Allergen Reactions  . Penicillins Other (See Comments)    Unknown reaction  . Sulfonamide Derivatives Hives    Medications Prior to Admission  Medication Dose Route Frequency Provider Last Rate Last Dose  . 0.45 % sodium chloride infusion   Intravenous Once Malissa Hippo, MD 20 mL/hr at 11/06/11 0816    . meperidine (DEMEROL) 50 MG/ML injection           . midazolam (VERSED) 5 MG/5ML injection           . simethicone susp in sterile water 1000 mL irrigation    PRN Malissa Hippo, MD       No current outpatient prescriptions on file as of 11/06/2011.    No results found for this or any previous visit (from the past 48 hour(s)). No results  found.  ROS  Blood pressure 125/75, pulse 79, temperature 98 F (36.7 C), temperature source Oral, resp. rate 18, height 5' 2.75" (1.594 m), weight 157 lb (71.215 kg), SpO2 96.00%. Physical Exam  Constitutional: She appears well-developed and well-nourished.  HENT:  Mouth/Throat: Oropharynx is clear and moist.  Eyes: Conjunctivae are normal. No scleral icterus.  Neck: No thyromegaly present.  Cardiovascular:  No murmur heard. Respiratory: Effort normal and breath sounds normal.  GI: Soft. Bowel sounds are normal. She exhibits no mass. There is no tenderness.  Musculoskeletal: She exhibits no edema.  Lymphadenopathy:    She has no cervical adenopathy.  Neurological: She is alert.  Skin: Skin is warm and dry.     Assessment/Plan History of colonic adenoma. Surveillance colonoscopy.  Debbrah Sampedro U 11/06/2011, 8:30 AM

## 2011-11-11 ENCOUNTER — Encounter (HOSPITAL_COMMUNITY): Payer: Self-pay | Admitting: Internal Medicine

## 2011-11-11 ENCOUNTER — Encounter (INDEPENDENT_AMBULATORY_CARE_PROVIDER_SITE_OTHER): Payer: Self-pay | Admitting: *Deleted

## 2011-12-11 ENCOUNTER — Telehealth: Payer: Self-pay

## 2011-12-11 DIAGNOSIS — E785 Hyperlipidemia, unspecified: Secondary | ICD-10-CM

## 2011-12-11 DIAGNOSIS — R5381 Other malaise: Secondary | ICD-10-CM

## 2011-12-11 NOTE — Telephone Encounter (Signed)
Needs labs printed

## 2011-12-13 LAB — BASIC METABOLIC PANEL
BUN: 16 mg/dL (ref 6–23)
CO2: 27 mEq/L (ref 19–32)
Calcium: 8.9 mg/dL (ref 8.4–10.5)
Chloride: 107 mEq/L (ref 96–112)
Creat: 0.86 mg/dL (ref 0.50–1.10)
Glucose, Bld: 88 mg/dL (ref 70–99)
Potassium: 5.4 mEq/L — ABNORMAL HIGH (ref 3.5–5.3)
Sodium: 142 mEq/L (ref 135–145)

## 2011-12-13 LAB — LIPID PANEL
Cholesterol: 211 mg/dL — ABNORMAL HIGH (ref 0–200)
HDL: 51 mg/dL (ref >39)
Total CHOL/HDL Ratio: 4.1 Ratio
VLDL: 31 mg/dL (ref 0–40)

## 2011-12-13 LAB — CBC WITH DIFFERENTIAL/PLATELET
Basophils Relative: 1 % (ref 0–1)
Hemoglobin: 13.1 g/dL (ref 12.0–15.0)
Lymphs Abs: 2 10*3/uL (ref 0.7–4.0)
MCHC: 30.9 g/dL (ref 30.0–36.0)
Monocytes Relative: 8 % (ref 3–12)
Neutro Abs: 2.4 10*3/uL (ref 1.7–7.7)
Neutrophils Relative %: 49 % (ref 43–77)
Platelets: 302 10*3/uL (ref 150–400)
RBC: 4.3 MIL/uL (ref 3.87–5.11)

## 2011-12-31 ENCOUNTER — Encounter: Payer: Self-pay | Admitting: Family Medicine

## 2011-12-31 ENCOUNTER — Ambulatory Visit (INDEPENDENT_AMBULATORY_CARE_PROVIDER_SITE_OTHER): Payer: Medicare Other | Admitting: Family Medicine

## 2011-12-31 ENCOUNTER — Other Ambulatory Visit (HOSPITAL_COMMUNITY)
Admission: RE | Admit: 2011-12-31 | Discharge: 2011-12-31 | Disposition: A | Discharge status: Home / Self Care | Payer: Medicare Other | Source: Ambulatory Visit | Attending: Family Medicine | Admitting: Family Medicine

## 2011-12-31 VITALS — BP 124/86 | HR 88 | Resp 16 | Ht 62.75 in | Wt 176.1 lb

## 2011-12-31 DIAGNOSIS — E663 Overweight: Secondary | ICD-10-CM

## 2011-12-31 DIAGNOSIS — N329 Bladder disorder, unspecified: Secondary | ICD-10-CM

## 2011-12-31 DIAGNOSIS — Z01419 Encounter for gynecological examination (general) (routine) without abnormal findings: Secondary | ICD-10-CM | POA: Insufficient documentation

## 2011-12-31 DIAGNOSIS — Z0000 Encounter for general adult medical examination without abnormal findings: Secondary | ICD-10-CM

## 2011-12-31 DIAGNOSIS — Z1211 Encounter for screening for malignant neoplasm of colon: Secondary | ICD-10-CM

## 2011-12-31 DIAGNOSIS — E785 Hyperlipidemia, unspecified: Secondary | ICD-10-CM

## 2011-12-31 LAB — POC HEMOCCULT BLD/STL (OFFICE/1-CARD/DIAGNOSTIC): Fecal Occult Blood, POC: NEGATIVE

## 2011-12-31 NOTE — Patient Instructions (Signed)
CPE in 1 year.  Please work on lifestyle changes to improve your health.  Weight loss goal of 10 pounds in 6 month  Call if you need referral for therapy please.  Accept my condolences on the loss of your Dad, remember.Marland KitchenMarland Kitchen"Life is weaker than love, and love is stronger than death"  Please increase fruit and vegetable intake and cut back on fried and fatty foods.  Start 1 baby aspirin once daily and calcium with D 1200mg  /1000IU one daily, also 1 multivitamin 1 daily please

## 2011-12-31 NOTE — Assessment & Plan Note (Signed)
Deteriorated. Patient re-educated about  the importance of commitment to a  minimum of 150 minutes of exercise per week. The importance of healthy food choices with portion control discussed. Encouraged to start a food diary, count calories and to consider  joining a support group. Sample diet sheets offered. Goals set by the patient for the next several months.    

## 2011-12-31 NOTE — Assessment & Plan Note (Signed)
unchanged

## 2011-12-31 NOTE — Progress Notes (Signed)
  Subjective:    Patient ID: Yvonne Brewer, female    DOB: [birth year], 66 y.o.   MRN: [id number]  HPI The PT is here for annual exam and re-evaluation of chronic medical conditions, medication management and review of any available recent lab and radiology data.  Preventive health is updated, specifically  Cancer screening and Immunization.   Questions or concerns regarding consultations or procedures which the PT has had in the interim are  addressed. The PT denies any adverse reactions to current medications since the last visit.  She has had a stressful Winter lost her father in December, then a good friend, tearful and still grieving , will consider counseling    Review of Systems See HPI Denies recent fever or chills. Denies sinus pressure, nasal congestion, ear pain or sore throat. Denies chest congestion, productive cough or wheezing. Denies chest pains, palpitations and leg swelling Denies abdominal pain, nausea, vomiting,diarrhea or constipation.   Denies dysuria, frequency, hesitancy or incontinence. Denies joint pain, swelling and limitation in mobility. Denies headaches, seizures, numbness, or tingling. Denies depression, anxiety or insomnia. Denies skin break down or rash.        Objective:   Physical Exam  Pleasant well nourished female, alert and oriented x 3, in no cardio-pulmonary distress. Afebrile. HEENT No facial trauma or asymetry. Sinuses non tender.  EOMI, PERTL,  External ears normal, tympanic membranes clear. Oropharynx moist, no exudate, good dentition. Neck: supple, no adenopathy,JVD or thyromegaly.No bruits.  Chest: Clear to ascultation bilaterally.No crackles or wheezes. Non tender to palpation  Breast: No asymetry,no masses. No nipple discharge or inversion. No axillary or supraclavicular adenopathy  Cardiovascular system; Heart sounds normal,  S1 and  S2 ,no S3.  No murmur, or thrill. Apical beat not displaced Peripheral pulses  normal.  Abdomen: Soft, non tender, no organomegaly or masses. No bruits. Bowel sounds normal. No guarding, tenderness or rebound.  Rectal:  No mass. Guaiac negative stool.  GU: External genitalia normal. No lesions.Bladder prolapse Vaginal canal normal.No discharge. Uterus normal size, no adnexal masses, no cervical motion or adnexal tenderness.  Musculoskeletal exam: Full ROM of spine, hips , shoulders and knees. No deformity ,swelling or crepitus noted. No muscle wasting or atrophy.   Neurologic: Cranial nerves 2 to 12 intact. Power, tone ,sensation and reflexes normal throughout. No disturbance in gait. No tremor.  Skin: Intact, no ulceration, erythema , scaling or rash noted. Pigmentation normal throughout  Psych; Normal mood and affect. Judgement and concentration normal.        Assessment & Plan:

## 2011-12-31 NOTE — Assessment & Plan Note (Signed)
Hyperlipidemia:Low fat diet discussed and encouraged.   

## 2011-12-31 NOTE — Progress Notes (Signed)
Addended by: Abner Greenspan on: 12/31/2011 11:52 AM   Modules accepted: Orders

## 2012-10-14 ENCOUNTER — Ambulatory Visit (INDEPENDENT_AMBULATORY_CARE_PROVIDER_SITE_OTHER): Payer: Medicare Other | Admitting: Family Medicine

## 2012-10-14 ENCOUNTER — Encounter: Payer: Self-pay | Admitting: Family Medicine

## 2012-10-14 VITALS — BP 134/92 | HR 95 | Resp 16 | Ht 62.75 in | Wt 177.8 lb

## 2012-10-14 DIAGNOSIS — E785 Hyperlipidemia, unspecified: Secondary | ICD-10-CM

## 2012-10-14 DIAGNOSIS — R030 Elevated blood-pressure reading, without diagnosis of hypertension: Secondary | ICD-10-CM

## 2012-10-14 DIAGNOSIS — E663 Overweight: Secondary | ICD-10-CM

## 2012-10-14 DIAGNOSIS — B029 Zoster without complications: Secondary | ICD-10-CM

## 2012-10-14 DIAGNOSIS — IMO0001 Reserved for inherently not codable concepts without codable children: Secondary | ICD-10-CM

## 2012-10-14 MED ORDER — GABAPENTIN 100 MG PO CAPS: 100.0000 mg | ORAL_CAPSULE | Freq: Every day | ORAL | Status: DC

## 2012-10-14 MED ORDER — ACYCLOVIR 800 MG PO TABS: ORAL_TABLET | ORAL | Status: AC

## 2012-10-14 NOTE — Patient Instructions (Addendum)
F/u as before.Call in 2 days if worse  Throat exam is normal  No rash is visible on the scalp  You are being treated for shingles.  Take the acyclovir for 10 days as directed, use the gabapentin one up to three at night if ABSOLUTELY necessary  For [employer] burning pain

## 2012-10-18 DIAGNOSIS — IMO0001 Reserved for inherently not codable concepts without codable children: Secondary | ICD-10-CM | POA: Insufficient documentation

## 2012-10-18 NOTE — Assessment & Plan Note (Signed)
elavated diastolic with weight gain. No meds prescribed at [facility] visit. Focus on weight loss and regular exercise with reduced sodium. NEEEDS RE EVAL IN 4 MONTH

## 2012-10-18 NOTE — Assessment & Plan Note (Signed)
Hyperlipidemia:Low fat diet discussed and encouraged.  Updated lab needed 

## 2012-10-18 NOTE — Assessment & Plan Note (Signed)
Deteriorated. Patient re-educated about  the importance of commitment to a  minimum of 150 minutes of exercise per week. The importance of healthy food choices with portion control discussed. Encouraged to start a food diary, count calories and to consider  joining a support group. Sample diet sheets offered. Goals set by the patient for the next several months.    

## 2012-10-18 NOTE — Assessment & Plan Note (Signed)
No open lesions. Faint rash in dermatomal distribution on right nasolabial fold. Acyclovir started, pt to call in 2 days if worsened for urgent dem eval Gabapentin prescribed in the event of development of  PHN

## 2012-10-18 NOTE — Progress Notes (Signed)
  Subjective:    Patient ID: Yvonne Brewer, female    DOB: Jan 21, 1946, 67 y.o.   MRN: [id number]  HPI 2 day h/o  Burning rash on right side of face in dermatomal distribution from right nasolabial fold to upper right lip.She has had zostavax. Generalized body aches no fever, sore throat. Denies cough Travelling in next week overseas, very concerned, also mentions rash under right eye, has researched and dx shingles. No other family member is ill   Review of Systems See HPI Denies recent fever or chills. Denies sinus pressure, nasal congestion, ear pain or sore throat. Denies chest congestion, productive cough or wheezing. Denies chest pains, palpitations and leg swelling Denies abdominal pain, nausea, vomiting,diarrhea or constipation.    Denies headaches or  seizures, Denies depression, anxiety or insomnia.       Objective:   Physical Exam Patient alert and oriented and in no cardiopulmonary distress.  HEENT: No facial asymmetry, EOMI, no sinus tenderness,  oropharynx pink and moist.No erythema or exudate.  Neck supple no adenopathy.  Chest: Clear to auscultation bilaterally.  CVS: S1, S2 no murmurs, no S3.  ABD: Soft non tender. Bowel sounds normal.  Ext: No edema  MS: Adequate ROM spine, shoulders, hips and knees.  Skin: Intact, erythematous maculopapular rash in dermatomal distribution on right nasolabial fold. No vesicles present. No open sores  Psych: Good eye contact, normal affect. Memory intact mildly anxious not  depressed appearing.  CNS: CN 2-12 intact, power, tone and sensation normal throughout.        Assessment & Plan:

## 2012-11-09 ENCOUNTER — Telehealth: Payer: Self-pay

## 2012-11-09 DIAGNOSIS — R5381 Other malaise: Secondary | ICD-10-CM

## 2012-11-09 DIAGNOSIS — R7301 Impaired fasting glucose: Secondary | ICD-10-CM

## 2012-11-09 DIAGNOSIS — E559 Vitamin D deficiency, unspecified: Secondary | ICD-10-CM

## 2012-11-09 DIAGNOSIS — E785 Hyperlipidemia, unspecified: Secondary | ICD-10-CM

## 2012-11-09 NOTE — Telephone Encounter (Signed)
Labs ordered to be mailed to Pt

## 2012-12-21 ENCOUNTER — Telehealth: Payer: Self-pay | Admitting: Family Medicine

## 2012-12-21 NOTE — Telephone Encounter (Signed)
Lab requisition printed and awaiting pickup

## 2012-12-23 LAB — LIPID PANEL
HDL: 44 mg/dL (ref >39)
LDL Cholesterol: 126 mg/dL — ABNORMAL HIGH (ref 0–99)
Total CHOL/HDL Ratio: 4.4 Ratio
VLDL: 25 mg/dL (ref 0–40)

## 2012-12-23 LAB — HEMOGLOBIN A1C
Hgb A1c MFr Bld: 5.3 % (ref <5.7)
Mean Plasma Glucose: 105 mg/dL (ref <117)

## 2012-12-23 LAB — BASIC METABOLIC PANEL
Calcium: 9 mg/dL (ref 8.4–10.5)
Glucose, Bld: 89 mg/dL (ref 70–99)
Potassium: 4.9 mEq/L (ref 3.5–5.3)
Sodium: 139 mEq/L (ref 135–145)

## 2012-12-31 ENCOUNTER — Encounter: Payer: Self-pay | Admitting: Family Medicine

## 2012-12-31 ENCOUNTER — Other Ambulatory Visit: Payer: Self-pay | Admitting: Family Medicine

## 2012-12-31 ENCOUNTER — Ambulatory Visit (HOSPITAL_COMMUNITY)
Admission: RE | Admit: 2012-12-31 | Discharge: 2012-12-31 | Disposition: A | Discharge status: Home / Self Care | Payer: Medicare Other | Source: Ambulatory Visit | Attending: Family Medicine | Admitting: Family Medicine

## 2012-12-31 ENCOUNTER — Ambulatory Visit (INDEPENDENT_AMBULATORY_CARE_PROVIDER_SITE_OTHER): Payer: Medicare Other | Admitting: Family Medicine

## 2012-12-31 VITALS — BP 140/90 | HR 75 | Resp 16 | Ht 62.75 in | Wt 177.0 lb

## 2012-12-31 DIAGNOSIS — Z139 Encounter for screening, unspecified: Secondary | ICD-10-CM

## 2012-12-31 DIAGNOSIS — Z1211 Encounter for screening for malignant neoplasm of colon: Secondary | ICD-10-CM

## 2012-12-31 DIAGNOSIS — E663 Overweight: Secondary | ICD-10-CM

## 2012-12-31 DIAGNOSIS — E785 Hyperlipidemia, unspecified: Secondary | ICD-10-CM

## 2012-12-31 DIAGNOSIS — N329 Bladder disorder, unspecified: Secondary | ICD-10-CM

## 2012-12-31 DIAGNOSIS — Z1231 Encounter for screening mammogram for malignant neoplasm of breast: Secondary | ICD-10-CM | POA: Insufficient documentation

## 2012-12-31 DIAGNOSIS — R5381 Other malaise: Secondary | ICD-10-CM

## 2012-12-31 DIAGNOSIS — IMO0001 Reserved for inherently not codable concepts without codable children: Secondary | ICD-10-CM

## 2012-12-31 DIAGNOSIS — B029 Zoster without complications: Secondary | ICD-10-CM

## 2012-12-31 DIAGNOSIS — R5383 Other fatigue: Secondary | ICD-10-CM

## 2012-12-31 DIAGNOSIS — R030 Elevated blood-pressure reading, without diagnosis of hypertension: Secondary | ICD-10-CM

## 2012-12-31 LAB — HEMOCCULT GUIAC POC 1CARD (OFFICE)

## 2012-12-31 NOTE — Progress Notes (Signed)
  Subjective:    Patient ID: Yvonne Brewer, female    DOB: 12-Oct-1945, 67 y.o.   MRN: [id number]  HPI The PT is here for follow up and re-evaluation of chronic medical conditions, medication management and review of any available recent lab and radiology data.  Preventive health is updated, specifically  Cancer screening and Immunization.  Will hold on TdAP but plans to schedule mammogram C/o weight gain, and lack of exercise,  Prolapse still present but no interest in definiive management now    Review of Systems See HPI Denies recent fever or chills. Denies sinus pressure, nasal congestion, ear pain or sore throat. Denies chest congestion, productive cough or wheezing. Denies chest pains, palpitations and leg swelling Denies abdominal pain, nausea, vomiting,diarrhea or constipation.   Denies dysuria, frequency, hesitancy or incontinence. Denies joint pain, swelling and limitation in mobility. Denies headaches, seizures, numbness, or tingling. Denies depression, anxiety or insomnia.        Objective:   Physical Exam Patient alert and oriented and in no cardiopulmonary distress.  HEENT: No facial asymmetry, EOMI, no sinus tenderness,  oropharynx pink and moist.  Neck supple no adenopathy.  Chest: Clear to auscultation bilaterally.  CVS: S1, S2 no murmurs, no S3.  ABD: Soft non tender. Bowel sounds normal. Rectal: no mass, heme negative stool Ext: No edema  MS: Adequate ROM spine, shoulders, hips and knees.  Skin: Intact, no ulcerations or rash noted.  Psych: Good eye contact, normal affect. Memory intact not anxious or depressed appearing.  CNS: CN 2-12 intact, power, tone and sensation normal throughout.        Assessment & Plan:

## 2012-12-31 NOTE — Patient Instructions (Signed)
F/u in 6 month, please call if you need me before  Mammogram to be scheduled at checkout   It is important that you exercise regularly at least 30 minutes 5 times a week. If you develop chest pain, have severe difficulty breathing, or feel very tired, stop exercising immediately and seek medical attention    A healthy diet is rich in fruit, vegetables and whole grains. Poultry fish, nuts and beans are a healthy choice for protein rather then red meat. A low sodium diet and drinking 64 ounces of water daily is generally recommended. Oils and sweet should be limited. Carbohydrates especially for those who are diabetic or overweight, should be limited to 34-45 gram per meal. It is important to eat on a regular schedule, at least 3 times daily. Snacks should be primarily fruits, vegetables or nuts.  Weight loss goal of 2 pounds per month   CBC, fasting lipid in 6 month  Blood pressure is high, eating a lot of fruit and vegetable, commiting io weight loss and regular exercise and normalize this. You CAN do this  You need to start calcium with D 1200mg  /1000IU once daily for bone health, reduce osteoperosis risk, also aspirin 81mg  one daily reduce stroke risk

## 2013-01-02 NOTE — Assessment & Plan Note (Signed)
Unchanged, pt wants to work on lifestyle to address this. No meds at this time DASH diet and commitment to daily physical activity for a minimum of 30 minutes discussed and encouraged, as a part of hypertension management. The importance of attaining a healthy weight is also discussed.

## 2013-01-02 NOTE — Assessment & Plan Note (Signed)
Deteriorated. Patient re-educated about  the importance of commitment to a  minimum of 150 minutes of exercise per week. The importance of healthy food choices with portion control discussed. Encouraged to start a food diary, count calories and to consider  joining a support group. Sample diet sheets offered. Goals set by the patient for the next several months.    

## 2013-01-02 NOTE — Assessment & Plan Note (Signed)
Still experiences tingling in right nostril, no rash however

## 2013-01-02 NOTE — Assessment & Plan Note (Signed)
Unchanged, no interest in intervention at this time

## 2013-01-02 NOTE — Assessment & Plan Note (Signed)
Improved, however LDL still elevated. Hyperlipidemia:Low fat diet discussed and encouraged.

## 2013-05-06 ENCOUNTER — Telehealth: Payer: Self-pay | Admitting: Family Medicine

## 2013-05-06 NOTE — Telephone Encounter (Signed)
Arrange for "next available " new pls and let her know

## 2013-05-11 NOTE — Telephone Encounter (Signed)
Left message for patient to call back  

## 2013-05-13 NOTE — Telephone Encounter (Signed)
Patient is aware also the patient has called and scheduled an appointment

## 2013-07-02 ENCOUNTER — Telehealth: Payer: Self-pay | Admitting: Family Medicine

## 2013-07-02 DIAGNOSIS — R5381 Other malaise: Secondary | ICD-10-CM

## 2013-07-02 DIAGNOSIS — E785 Hyperlipidemia, unspecified: Secondary | ICD-10-CM

## 2013-07-02 NOTE — Telephone Encounter (Signed)
Lab order sent to lab

## 2013-07-08 ENCOUNTER — Ambulatory Visit: Payer: Medicare Other | Admitting: Family Medicine

## 2013-07-13 LAB — CBC WITH DIFFERENTIAL/PLATELET
Eosinophils Absolute: 0.1 10*3/uL (ref 0.0–0.7)
Eosinophils Relative: 2 % (ref 0–5)
HCT: 40.5 % (ref 36.0–46.0)
Hemoglobin: 13.6 g/dL (ref 12.0–15.0)
Lymphs Abs: 2 10*3/uL (ref 0.7–4.0)
MCH: 32.2 pg (ref 26.0–34.0)
MCV: 95.7 fL (ref 78.0–100.0)
Monocytes Absolute: 0.5 10*3/uL (ref 0.1–1.0)
Monocytes Relative: 7 % (ref 3–12)
RBC: 4.23 MIL/uL (ref 3.87–5.11)

## 2013-07-13 LAB — LIPID PANEL: Cholesterol: 223 mg/dL — ABNORMAL HIGH (ref 0–200)

## 2013-07-15 ENCOUNTER — Encounter: Payer: Self-pay | Admitting: Family Medicine

## 2013-07-15 ENCOUNTER — Ambulatory Visit (INDEPENDENT_AMBULATORY_CARE_PROVIDER_SITE_OTHER): Payer: Medicare Other | Admitting: Family Medicine

## 2013-07-15 ENCOUNTER — Encounter (INDEPENDENT_AMBULATORY_CARE_PROVIDER_SITE_OTHER): Payer: Self-pay

## 2013-07-15 VITALS — BP 144/90 | HR 94 | Resp 16 | Ht 62.75 in | Wt 179.4 lb

## 2013-07-15 DIAGNOSIS — E669 Obesity, unspecified: Secondary | ICD-10-CM

## 2013-07-15 DIAGNOSIS — E559 Vitamin D deficiency, unspecified: Secondary | ICD-10-CM

## 2013-07-15 DIAGNOSIS — E785 Hyperlipidemia, unspecified: Secondary | ICD-10-CM

## 2013-07-15 DIAGNOSIS — Z139 Encounter for screening, unspecified: Secondary | ICD-10-CM

## 2013-07-15 DIAGNOSIS — R030 Elevated blood-pressure reading, without diagnosis of hypertension: Secondary | ICD-10-CM

## 2013-07-15 DIAGNOSIS — IMO0001 Reserved for inherently not codable concepts without codable children: Secondary | ICD-10-CM

## 2013-07-15 NOTE — Progress Notes (Signed)
  Subjective:    Patient ID: Yvonne Brewer, female    DOB: 1945/11/29, 67 y.o.   MRN: [id number]  HPI The PT is here for follow up and re-evaluation of chronic medical conditions, medication management and review of any available recent lab and radiology data.  Preventive health is updated, specifically  Cancer screening and Immunization.   There are no specific complaints  C/o poor eating habits and inconsistency in exercise with weight gain , increased Bp and increase in lipids. Trying hard to be consistent, but increased travel and pleasure with retirement has been an issue. Will work more consistently on healthy change, as wants to live and she enjoys life. Issues of caffeine discussed, not deemed excessive or detrimental in her case, of note , discussed drinking scotch 3 times per week, plans to cut back, as she read alcohol is detrimental. Denies dependence issue, just drinks "with her spouse"      Review of Systems See HPI Denies recent fever or chills. Denies sinus pressure, nasal congestion, ear pain or sore throat. Denies chest congestion, productive cough or wheezing. Denies chest pains, palpitations and leg swelling Denies abdominal pain, nausea, vomiting,diarrhea or constipation.   Denies dysuria, frequency, hesitancy or incontinence. Denies joint pain, swelling and limitation in mobility. Denies headaches, seizures, numbness, or tingling. Denies depression, anxiety or insomnia. Denies skin break down or rash.        Objective:   Physical Exam  Patient alert and oriented and in no cardiopulmonary distress.  HEENT: No facial asymmetry, EOMI, no sinus tenderness,  oropharynx pink and moist.  Neck supple no adenopathy.  Chest: Clear to auscultation bilaterally.  CVS: S1, S2 no murmurs, no S3.  ABD: Soft non tender. Bowel sounds normal.  Ext: No edema  MS: Adequate ROM spine, shoulders, hips and knees.  Skin: Intact, no ulcerations or rash noted.  Psych:  Good eye contact, normal affect. Memory intact not anxious or depressed appearing.  CNS: CN 2-12 intact, power, tone and sensation normal throughout.       Assessment & Plan:

## 2013-07-15 NOTE — Patient Instructions (Addendum)
F/u in 6 month, call if you need me before please  Fasting lipid, blood glucose and vit D level in 5 month 3 week  Please commit to daily physical activity for at least 30 minutes daily, and enjoy this!  PLease change eating as we discussed, it gets better over time and easier...think HEALTH!!!   Weight loss goal of 5 pounds , to help blood pressure and overall health  Cough described, I believe is allergy based, so OTC products are fine, like zyrtec or claritin, also saline flushes (netty pot) of nostrils once or twice daily tio help get rid of the excess mucus in your upper airway, sinuses and nostrils, is highly  Commit to daily calcium with D and aspirin 81 mg daily please, this is recommended

## 2013-07-17 NOTE — Assessment & Plan Note (Signed)
Ongoing elevatipon in BP. Pt would benfit from ;low dose of antihypertensive, she continues to report "normal numbers" at home,a nd is really focused on lifestyle change as the option which she prefers. DASH diet and commitment to daily physical activity for a minimum of 30 minutes discussed and encouraged, as a part of hypertension management. The importance of attaining a healthy weight is also discussed.

## 2013-07-17 NOTE — Assessment & Plan Note (Signed)
Mild supplement with daily calcium and D should be adequate, ot needs to take faithfully

## 2013-07-17 NOTE — Assessment & Plan Note (Signed)
Deteriorated. Patient re-educated about  the importance of commitment to a  minimum of 150 minutes of exercise per week. The importance of healthy food choices with portion control discussed. Encouraged to start a food diary, count calories and to consider  joining a support group. Sample diet sheets offered. Goals set by the patient for the next several months.    

## 2013-07-17 NOTE — Assessment & Plan Note (Signed)
Deteriorated Hyperlipidemia:Low fat diet discussed and encouraged.   

## 2013-08-09 ENCOUNTER — Telehealth: Payer: Self-pay

## 2013-08-12 NOTE — Telephone Encounter (Signed)
Need addressed.

## 2013-08-26 DIAGNOSIS — I10 Essential (primary) hypertension: Secondary | ICD-10-CM

## 2013-08-26 HISTORY — DX: Essential (primary) hypertension: I10

## 2013-09-29 ENCOUNTER — Telehealth: Payer: Self-pay

## 2013-09-29 DIAGNOSIS — H9209 Otalgia, unspecified ear: Secondary | ICD-10-CM

## 2013-09-29 NOTE — Telephone Encounter (Signed)
Pls refer pt to ENT tomorrow  If at all possible, per time requested in [place] if at all possible, otherwise appt here next week, since a longstanding prob I  Think she should see  ENT

## 2013-09-30 ENCOUNTER — Ambulatory Visit (INDEPENDENT_AMBULATORY_CARE_PROVIDER_SITE_OTHER): Payer: Medicare Other | Admitting: Otolaryngology

## 2013-09-30 DIAGNOSIS — R070 Pain in throat: Secondary | ICD-10-CM

## 2013-09-30 DIAGNOSIS — H9209 Otalgia, unspecified ear: Secondary | ICD-10-CM

## 2013-09-30 NOTE — Telephone Encounter (Signed)
Appointment today 2.5.15 at 4:00 patient is aware

## 2014-01-11 ENCOUNTER — Ambulatory Visit: Payer: Medicare Other | Admitting: Family Medicine

## 2014-02-12 ENCOUNTER — Other Ambulatory Visit: Payer: Self-pay | Admitting: Family Medicine

## 2014-02-12 DIAGNOSIS — Z139 Encounter for screening, unspecified: Secondary | ICD-10-CM

## 2014-02-24 ENCOUNTER — Telehealth: Payer: Self-pay | Admitting: Family Medicine

## 2014-02-24 ENCOUNTER — Ambulatory Visit (HOSPITAL_COMMUNITY)
Admission: RE | Admit: 2014-02-24 | Discharge: 2014-02-24 | Disposition: A | Discharge status: Home / Self Care | Payer: Medicare Other | Source: Ambulatory Visit | Attending: Family Medicine | Admitting: Family Medicine

## 2014-02-24 DIAGNOSIS — R5381 Other malaise: Secondary | ICD-10-CM

## 2014-02-24 DIAGNOSIS — Z1231 Encounter for screening mammogram for malignant neoplasm of breast: Secondary | ICD-10-CM | POA: Insufficient documentation

## 2014-02-24 DIAGNOSIS — E785 Hyperlipidemia, unspecified: Secondary | ICD-10-CM

## 2014-02-24 DIAGNOSIS — Z139 Encounter for screening, unspecified: Secondary | ICD-10-CM

## 2014-02-24 DIAGNOSIS — R5383 Other fatigue: Secondary | ICD-10-CM

## 2014-02-24 NOTE — Telephone Encounter (Signed)
patint aware and lab sent

## 2014-02-24 NOTE — Telephone Encounter (Signed)
pls order fasting lipid and TSH

## 2014-02-24 NOTE — Addendum Note (Signed)
Addended by: Eual Fines on: 02/24/2014 05:00 PM   Modules accepted: Orders

## 2014-02-24 NOTE — Telephone Encounter (Signed)
What labs does she need? 

## 2014-02-28 ENCOUNTER — Telehealth: Payer: Self-pay | Admitting: Family Medicine

## 2014-03-01 ENCOUNTER — Other Ambulatory Visit: Payer: Self-pay | Admitting: Family Medicine

## 2014-03-01 ENCOUNTER — Other Ambulatory Visit: Payer: Self-pay

## 2014-03-01 DIAGNOSIS — N6310 Unspecified lump in the right breast, unspecified quadrant: Secondary | ICD-10-CM

## 2014-03-01 DIAGNOSIS — Z1231 Encounter for screening mammogram for malignant neoplasm of breast: Secondary | ICD-10-CM

## 2014-03-01 NOTE — Telephone Encounter (Signed)
Patient was instructed by md to call [facility] center to schedule.

## 2014-03-04 ENCOUNTER — Telehealth: Payer: Self-pay | Admitting: *Deleted

## 2014-03-04 DIAGNOSIS — N3000 Acute cystitis without hematuria: Secondary | ICD-10-CM

## 2014-03-04 LAB — LIPID PANEL
CHOL/HDL RATIO: 4.3 ratio
CHOLESTEROL: 227 mg/dL — AB (ref 0–200)
HDL: 53 mg/dL (ref >39)
LDL Cholesterol: 151 mg/dL — ABNORMAL HIGH (ref 0–99)
Triglycerides: 117 mg/dL (ref <150)
VLDL: 23 mg/dL (ref 0–40)

## 2014-03-04 LAB — TSH: TSH: 1.266 u[IU]/mL (ref 0.350–4.500)

## 2014-03-04 NOTE — Telephone Encounter (Signed)
Order sent to lab for urine culture.

## 2014-03-04 NOTE — Telephone Encounter (Signed)
Read note in the comments.

## 2014-03-07 ENCOUNTER — Other Ambulatory Visit: Payer: Medicare Other

## 2014-03-07 LAB — URINE CULTURE

## 2014-03-08 ENCOUNTER — Encounter (HOSPITAL_COMMUNITY): Payer: Medicare Other

## 2014-03-08 ENCOUNTER — Ambulatory Visit (INDEPENDENT_AMBULATORY_CARE_PROVIDER_SITE_OTHER): Payer: Medicare Other | Admitting: Family Medicine

## 2014-03-08 ENCOUNTER — Encounter: Payer: Self-pay | Admitting: Family Medicine

## 2014-03-08 VITALS — BP 140/90 | HR 73 | Resp 16 | Ht 62.75 in | Wt 182.4 lb

## 2014-03-08 DIAGNOSIS — IMO0001 Reserved for inherently not codable concepts without codable children: Secondary | ICD-10-CM

## 2014-03-08 DIAGNOSIS — Z0000 Encounter for general adult medical examination without abnormal findings: Secondary | ICD-10-CM

## 2014-03-08 DIAGNOSIS — E66811 Obesity, class 1: Secondary | ICD-10-CM

## 2014-03-08 DIAGNOSIS — R5381 Other malaise: Secondary | ICD-10-CM

## 2014-03-08 DIAGNOSIS — M5416 Radiculopathy, lumbar region: Secondary | ICD-10-CM

## 2014-03-08 DIAGNOSIS — M609 Myositis, unspecified: Secondary | ICD-10-CM

## 2014-03-08 DIAGNOSIS — E669 Obesity, unspecified: Secondary | ICD-10-CM

## 2014-03-08 DIAGNOSIS — N3001 Acute cystitis with hematuria: Secondary | ICD-10-CM | POA: Insufficient documentation

## 2014-03-08 DIAGNOSIS — IMO0002 Reserved for concepts with insufficient information to code with codable children: Secondary | ICD-10-CM

## 2014-03-08 DIAGNOSIS — H547 Unspecified visual loss: Secondary | ICD-10-CM

## 2014-03-08 DIAGNOSIS — R928 Other abnormal and inconclusive findings on diagnostic imaging of breast: Secondary | ICD-10-CM

## 2014-03-08 DIAGNOSIS — R5383 Other fatigue: Secondary | ICD-10-CM

## 2014-03-08 DIAGNOSIS — N3000 Acute cystitis without hematuria: Secondary | ICD-10-CM

## 2014-03-08 DIAGNOSIS — E785 Hyperlipidemia, unspecified: Secondary | ICD-10-CM

## 2014-03-08 DIAGNOSIS — I10 Essential (primary) hypertension: Secondary | ICD-10-CM

## 2014-03-08 DIAGNOSIS — R030 Elevated blood-pressure reading, without diagnosis of hypertension: Secondary | ICD-10-CM

## 2014-03-08 DIAGNOSIS — G8929 Other chronic pain: Secondary | ICD-10-CM

## 2014-03-08 MED ORDER — PREDNISONE 5 MG PO TABS: 5.0000 mg | ORAL_TABLET | Freq: Two times a day (BID) | ORAL | Status: DC

## 2014-03-08 MED ORDER — RAMIPRIL 2.5 MG PO CAPS: 2.5000 mg | ORAL_CAPSULE | Freq: Every day | ORAL | Status: DC

## 2014-03-08 MED ORDER — PREDNISONE 5 MG PO TABS: 5.0000 mg | ORAL_TABLET | Freq: Two times a day (BID) | ORAL | Status: AC

## 2014-03-08 NOTE — Progress Notes (Signed)
Subjective:    Patient ID: Yvonne Brewer, female    DOB: 12-22-1945, 68 y.o.   MRN: [id number]  HPI  Preventive Screening-Counseling & Management   Patient present here today for an initial  Medicare annual wellness visit.  She also has several concerns which she wishes to address today   2 week h/o severe LBP, radiatinfg to buttocks, seeing chiropracter , dx with arthritis with muscle inflammation, getting tens treatments there, pain is 4/5 , still using alleve , prob 2 tabs 3 days per week After her 4 hour car drive this past Sunday, pain went back up to an 8 , states her mattress is being changed  Urine need ID f/u pt is asymptomatic  Breast exam pending, concerned about recent reprot of abnormal rigth mammogram    Current Problems (verified)   Medications Prior to Visit Allergies (verified)   PAST HISTORY  Family History: mother had denmentia , no depression, no premature CAD  Social History Married , retired from education, no  Current alcohol or illoicit drug use, does drink wine    Risk Factors  Current exercise habits:  approx 3 days per week , limited by new back pain plans to increase as soon as ablle  Dietary issues discussed:heart healthy reduced sugar and carb and fat intake   Cardiac risk factors: None significant  Depression Screen  (Note: if answer to either of the following is "Yes", a more complete depression screening is indicated)   Over the past two weeks, have you felt down, depressed or hopeless? No  Over the past two weeks, have you felt little interest or pleasure in doing things? No  Have you lost interest or pleasure in daily life? No  Do you often feel hopeless? No  Do you cry easily over simple problems? No   Activities of Daily Living  In your present state of health, do you have any difficulty performing the following activities?  Driving?: No Managing money?: No Feeding yourself?:No Getting from bed to chair?:No Climbing a  flight of stairs?:No Preparing food and eating?:No Bathing or showering?:No Getting dressed?:No Getting to the toilet?:No Using the toilet?:No Moving around from place to place?: Not generally, but has been experiencing disabling low back pain which radiates to buttock and lower extremitiy in past 3 to 4 weeks, seeing chiropracter for this since it started , may be 28% better but uncertain at this time  Fall Risk Assessment In the past year have you fallen or had a near fall?:No Are you currently taking any medications that make you dizzy?:No   Hearing Difficulties: No Do you often ask people to speak up or repeat themselves?:No Do you experience ringing or noises in your ears?:No Do you have difficulty understanding soft or whispered voices?:No  Cognitive Testing  Alert? Yes Normal Appearance?Yes  Oriented to person? Yes Place? Yes  Time? Yes  Displays appropriate judgment?Yes  Can read the correct time from a watch face? yes Are you having problems remembering things?No  Advanced Directives have been discussed with the patient?Yes , currently a full code,has a living will which she will look at again, since this was done years ago   List the Names of Other Physician/Practitioners you currently use: none on a regular basis , has seen ENT Te[facility] in the  past 12 month for chronic ear pain eval, has been seeing podiatrist regularly in past month for back pain radiaiting to lower extremity   Indicate any recent Medical Services you may have  received from other than [facility] providers in the past year (date may be approximate).   Assessment:    Annual Wellness Exam   Plan:    D.  Medicare Attestation  I have personally reviewed:  The patient's medical and social history  Their use of alcohol, tobacco or illicit drugs  Their current medications and supplements  The patient's functional ability including ADLs,fall risks, home safety risks, cognitive, and hearing and visual  impairment  Diet and physical activities  Evidence for depression or mood disorders  The patient's weight, height, BMI, and visual acuity have been recorded in the chart. I have made referrals, counseling, and provided education to the patient based on review of the above and I have provided the patient with a written personalized care plan for preventive services.     Review of Systems     Objective:   Physical Exam BP 140/90  Pulse 73  Resp 16  Ht 5' 2.75" (1.594 m)  Wt 182 lb 6.4 oz (82.736 kg)  BMI 32.56 kg/m2  SpO2 97% Patient alert and oriented and in no cardiopulmonary distress.  HEENT: No facial asymmetry, EOMI,   oropharynx pink and moist.  Neck supple no JVD, no mass.  Chest: Clear to auscultation bilaterally.  CVS: S1, S2 no murmurs, no S3.Regular rate.  ABD: Soft non tender.   Ext: No edema  MS: not examined  Skin: Intact, no ulcerations or rash noted.  Psych: Good eye contact, normal affect. Memory intact mildly  anxious not  depressed appearing.  CNS: CN 2-12 intact, power,  normal throughout.no focal deficits noted.        Assessment & Plan:  Medicare annual wellness visit, initial Annual exam as documented. Counseling done  re healthy lifestyle involving commitment to 150 minutes exercise per week, heart healthy diet, and attaining healthy weight.The importance of adequate sleep also discussed. Regular seat belt use a, is also discussed. Changes in health habits are decided on by the patient with goals and time frames  set for achieving them. Immunization and cancer screening needs are specifically addressed at this visit.   Elevated BP diagnisis of hypertension established at this visit , pt to start medication DASH diet and commitment to daily physical activity for a minimum of 30 minutes discussed and encouraged, as a part of hypertension management. The importance of attaining a healthy weight is also discussed. F/u in 8 weeks, needs chem 7  for that visit   Acute cystitis Currently  asymptomatic despite recent antibiotc course from outside Provider. Requested urine to be "checked" low colony count of E coli, pt has multiple listed allergies, no oral  Antibiotic is sensitive tpo the E coli, advised regular voiding, increased water intake , appropriate hygiene. She does have significant bladder prolapse and if this continues to be an issue may need to consider definitive management, of note, she has not been dx or treated fo UTI by me this year  HTN, goal below 140/90 DASH diet and commitment to daily physical activity for a minimum of 30 minutes discussed and encouraged, as a part of hypertension management. The importance of attaining a healthy weight is also discussed.  Start medication with close fu  Obesity (BMI 30.0-34.9) Deteriorated. Patient re-educated about  the importance of commitment to a  minimum of 150 minutes of exercise per week. The importance of healthy food choices with portion control discussed. Encouraged to start a food diary, count calories and to consider  joining a support group. Sample  diet sheets offered. Goals set by the patient for the next several months.     HYPERLIPIDEMIA Deteriorated, would benefit from statin, however , since just starting altace , will hold on additional new med at this time Hyperlipidemia:Low fat diet discussed and encouraged.  Overall CHD risk is average   Chronic radicular pain of lower back Approx 3 week h/o low back pain radiaiting down to her buttock and lower extremities, has improved with manipulations by chiropracter, from a 10 to 5 , but sttaes that 3 days ago after 4 hour car ride the pain was back po to an 8 5 day course of prenisone 10mg  prescribed, sounds as though due to nerve irritation, pt to call back for imaging studies if needed  Abnormal mammogram of right breast Pt reports she palpates no mass , and is anxious to have imaging study which is  recommended done , has appt in the next 2 weeks, will f/u on this  Reduced vision S/p bilateral cataract surgery, needs eye exam , reduced vision on screen, pt aware and will schedule her appt

## 2014-03-08 NOTE — Patient Instructions (Addendum)
F/u in 8 weeks, call if you need me before  Fasting chem 7 and CBc in 8 weeks   I will contact the ID Doc re urine an get back with you  Commit to voiding often, and drinking a lot of water (64 ounces)  Dietary change  Physical acitivity , needs to be increased  Weight loss  And  reduce salt intake also  New med for BP is ramipril 2.5 mg daily   For chronic Back pain , pls  call in if desire imaging Pred x 5 days twice daily is being sent in as anti inflammatory  Commit aspirin 81 mg daily stroke risk reduction  Pls schedule and keep appt with eye specialist , you have  impairment in vision  Pls make arrangements to have your advanced directives addressed

## 2014-03-09 ENCOUNTER — Telehealth: Payer: Self-pay | Admitting: Family Medicine

## 2014-03-09 NOTE — Telephone Encounter (Signed)
Pls contact pt and let her know that since "by definition" she does not have a UTI, since bacterial count is too low , will not prescribe an antibiotic. She is encouraged to drink a lot of water and void often. Should she develop symptoms , needs to submit urine for c/s , offer and OK to provide a cup with lab order sheet (She had recently taken cipro without c/s ,and now has too low a bacterial colony  count to be dx as a  UTI and the bacteria is resistant to cipro but sensitive to antibiotics that she reports being allergic to)  ?? pls ask  (I had told her I would call her back as to what to do about the urine c/s report )

## 2014-03-10 NOTE — Telephone Encounter (Signed)
Patient aware  Wanted me to note in her record that for UTI's in the past she has taken Macrodantin without problems.

## 2014-03-13 ENCOUNTER — Encounter: Payer: Self-pay | Admitting: Family Medicine

## 2014-03-13 DIAGNOSIS — Z0000 Encounter for general adult medical examination without abnormal findings: Secondary | ICD-10-CM | POA: Insufficient documentation

## 2014-03-13 DIAGNOSIS — H547 Unspecified visual loss: Secondary | ICD-10-CM | POA: Insufficient documentation

## 2014-03-13 DIAGNOSIS — M5416 Radiculopathy, lumbar region: Secondary | ICD-10-CM

## 2014-03-13 DIAGNOSIS — G8929 Other chronic pain: Secondary | ICD-10-CM | POA: Insufficient documentation

## 2014-03-13 DIAGNOSIS — R928 Other abnormal and inconclusive findings on diagnostic imaging of breast: Secondary | ICD-10-CM | POA: Insufficient documentation

## 2014-03-13 NOTE — Assessment & Plan Note (Signed)
Annual exam as documented. Counseling done  re healthy lifestyle involving commitment to 150 minutes exercise per week, heart healthy diet, and attaining healthy weight.The importance of adequate sleep also discussed. Regular seat belt use a, is also discussed. Changes in health habits are decided on by the patient with goals and time frames  set for achieving them. Immunization and cancer screening needs are specifically addressed at this visit.

## 2014-03-13 NOTE — Assessment & Plan Note (Signed)
diagnisis of hypertension established at this visit , pt to start medication DASH diet and commitment to daily physical activity for a minimum of 30 minutes discussed and encouraged, as a part of hypertension management. The importance of attaining a healthy weight is also discussed. F/u in 8 weeks, needs chem 7 for that visit

## 2014-03-13 NOTE — Assessment & Plan Note (Signed)
Pt reports she palpates no mass , and is anxious to have imaging study which is recommended done , has appt in the next 2 weeks, will f/u on this

## 2014-03-13 NOTE — Assessment & Plan Note (Signed)
S/p bilateral cataract surgery, needs eye exam , reduced vision on screen, pt aware and will schedule her appt

## 2014-03-13 NOTE — Assessment & Plan Note (Signed)
Deteriorated. Patient re-educated about  the importance of commitment to a  minimum of 150 minutes of exercise per week. The importance of healthy food choices with portion control discussed. Encouraged to start a food diary, count calories and to consider  joining a support group. Sample diet sheets offered. Goals set by the patient for the next several months.    

## 2014-03-13 NOTE — Assessment & Plan Note (Signed)
Currently  asymptomatic despite recent antibiotc course from outside Provider. Requested urine to be "checked" low colony count of E coli, pt has multiple listed allergies, no oral  Antibiotic is sensitive tpo the E coli, advised regular voiding, increased water intake , appropriate hygiene. She does have significant bladder prolapse and if this continues to be an issue may need to consider definitive management, of note, she has not been dx or treated fo UTI by me this year

## 2014-03-13 NOTE — Assessment & Plan Note (Signed)
Approx 3 week h/o low back pain radiaiting down to her buttock and lower extremities, has improved with manipulations by chiropracter, from a 10 to 5 , but sttaes that 3 days ago after 4 hour car ride the pain was back po to an 8 5 day course of prenisone 10mg  prescribed, sounds as though due to nerve irritation, pt to call back for imaging studies if needed

## 2014-03-13 NOTE — Assessment & Plan Note (Signed)
Deteriorated, would benefit from statin, however , since just starting altace , will hold on additional new med at this time Hyperlipidemia:Low fat diet discussed and encouraged.  Overall CHD risk is average

## 2014-03-13 NOTE — Assessment & Plan Note (Signed)
DASH diet and commitment to daily physical activity for a minimum of 30 minutes discussed and encouraged, as a part of hypertension management. The importance of attaining a healthy weight is also discussed.  Start medication with close fu

## 2014-03-17 ENCOUNTER — Telehealth: Payer: Self-pay

## 2014-03-17 DIAGNOSIS — N3000 Acute cystitis without hematuria: Secondary | ICD-10-CM

## 2014-03-17 MED ORDER — DOXYCYCLINE HYCLATE 100 MG PO TABS: 100.0000 mg | ORAL_TABLET | Freq: Two times a day (BID) | ORAL | Status: DC

## 2014-03-17 NOTE — Telephone Encounter (Signed)
pls send in doxycycline 100mg  one twice daily #10, let her know also pls

## 2014-03-17 NOTE — Telephone Encounter (Signed)
Med sent to pharmacy.  Patient aware.  Urine sent for culture.

## 2014-03-17 NOTE — Addendum Note (Signed)
Addended by: Denman George B on: 03/17/2014 10:33 AM   Modules accepted: Orders

## 2014-03-20 ENCOUNTER — Other Ambulatory Visit: Payer: Self-pay | Admitting: Family Medicine

## 2014-03-20 LAB — URINE CULTURE: Colony Count: >=100000

## 2014-03-21 ENCOUNTER — Other Ambulatory Visit: Payer: Self-pay

## 2014-03-21 MED ORDER — LEVOFLOXACIN 500 MG PO TABS: 500.0000 mg | ORAL_TABLET | Freq: Every day | ORAL | Status: DC

## 2014-03-21 MED ORDER — FLUCONAZOLE 150 MG PO TABS: 150.0000 mg | ORAL_TABLET | Freq: Once | ORAL | Status: DC

## 2014-03-22 ENCOUNTER — Ambulatory Visit (HOSPITAL_COMMUNITY)
Admission: RE | Admit: 2014-03-22 | Discharge: 2014-03-22 | Disposition: A | Discharge status: Home / Self Care | Payer: Medicare Other | Source: Ambulatory Visit | Attending: Family Medicine | Admitting: Family Medicine

## 2014-03-22 ENCOUNTER — Other Ambulatory Visit: Payer: Self-pay | Admitting: Family Medicine

## 2014-03-22 DIAGNOSIS — N63 Unspecified lump in unspecified breast: Secondary | ICD-10-CM | POA: Diagnosis not present

## 2014-03-22 DIAGNOSIS — N6310 Unspecified lump in the right breast, unspecified quadrant: Secondary | ICD-10-CM

## 2014-03-22 DIAGNOSIS — R928 Other abnormal and inconclusive findings on diagnostic imaging of breast: Secondary | ICD-10-CM

## 2014-05-05 ENCOUNTER — Ambulatory Visit: Payer: Medicare Other | Admitting: Family Medicine

## 2014-05-10 ENCOUNTER — Ambulatory Visit: Payer: Medicare Other | Admitting: Family Medicine

## 2014-06-02 ENCOUNTER — Encounter: Payer: Self-pay | Admitting: Family Medicine

## 2014-06-02 ENCOUNTER — Encounter (INDEPENDENT_AMBULATORY_CARE_PROVIDER_SITE_OTHER): Payer: Self-pay

## 2014-06-02 ENCOUNTER — Ambulatory Visit (INDEPENDENT_AMBULATORY_CARE_PROVIDER_SITE_OTHER): Payer: Medicare Other | Admitting: Family Medicine

## 2014-06-02 VITALS — BP 120/80 | HR 73 | Resp 18 | Ht 62.75 in | Wt 182.2 lb

## 2014-06-02 DIAGNOSIS — I10 Essential (primary) hypertension: Secondary | ICD-10-CM

## 2014-06-02 DIAGNOSIS — E785 Hyperlipidemia, unspecified: Secondary | ICD-10-CM

## 2014-06-02 MED ORDER — AMLODIPINE BESYLATE 2.5 MG PO TABS: ORAL_TABLET | ORAL | Status: DC

## 2014-06-02 NOTE — Patient Instructions (Addendum)
Nuse BP check first week in November  MD follow up early January  STOP RAMIPRIL YOU ARE ALLERGiC  NEW for blood pressure, take TOMORROW NIGHT, SAME tIME EVERY nIGHT is amlodipine. S/e to be careful and watch for are light headedness and leg swelling, hopefully this will not be a problem but STOP med if it is and call in  Fasting lipid,  And chem 7 in January before visit  Take aspirin 81 mg daily to reduce stroke risk  Calcium is best supplemented through diet, eg skim milk and low fat yogurt, per new recommendations, ...maybe!

## 2014-06-19 NOTE — Assessment & Plan Note (Signed)
Controlled , but ACE intolerant Start new amlodipine , nurse BP check in 6 weeks DASH diet and commitment to daily physical activity for a minimum of 30 minutes discussed and encouraged, as a part of hypertension management. The importance of attaining a healthy weight is also discussed.

## 2014-06-19 NOTE — Assessment & Plan Note (Signed)
Hyperlipidemia:Low fat diet discussed and encouraged.  Updated lab needed at/ before next visit.  

## 2014-06-19 NOTE — Progress Notes (Signed)
   Subjective:    Patient ID: Yvonne Brewer, female    DOB: 04/18/1946, 68 y.o.   MRN: [id number]  HPI  Pt in for re evaluation of bP on new medication. bp is good, but unfortunately, she has had an ACE cough so will need to be changedThe value of consistent healthy lifestyle is stressed Immunization is up to date. No other health concerns  Review of Systems See HPI Denies recent fever or chills. Denies sinus pressure, nasal congestion, ear pain or sore throat. Denies chest congestion, productive cough or wheezing. Denies chest pains, palpitations and leg swelling        Objective:   Physical Exam  BP 120/80  Pulse 73  Resp 18  Ht 5' 2.75" (1.594 m)  Wt 182 lb 3.2 oz (82.645 kg)  BMI 32.53 kg/m2  SpO2 97% Patient alert and oriented and in no cardiopulmonary distress.  HEENT: No facial asymmetry, EOMI,   oropharynx pink and moist.  Neck supple no JVD, no mass.  Chest: Clear to auscultation bilaterally.  CVS: S1, S2 no murmurs, no S3.Regular rate.  ABD: Soft non tender.   Ext: No edema       Assessment & Plan:  HTN, goal below 140/90 Controlled , but ACE intolerant Start new amlodipine , nurse BP check in 6 weeks DASH diet and commitment to daily physical activity for a minimum of 30 minutes discussed and encouraged, as a part of hypertension management. The importance of attaining a healthy weight is also discussed.   Hyperlipidemia LDL goal <100 Hyperlipidemia:Low fat diet discussed and encouraged.  Updated lab needed at/ before next visit.

## 2014-06-23 ENCOUNTER — Encounter: Payer: Self-pay | Admitting: Family Medicine

## 2014-06-28 ENCOUNTER — Ambulatory Visit: Payer: Medicare Other

## 2014-06-28 VITALS — BP 126/80

## 2014-06-28 DIAGNOSIS — I10 Essential (primary) hypertension: Secondary | ICD-10-CM

## 2014-06-28 NOTE — Progress Notes (Signed)
Patient in for nurse visit blood pressure check.   Blood pressure checked manually in left arm.  Patient is going to stop Amlodipine until next visit.

## 2014-07-12 ENCOUNTER — Ambulatory Visit: Payer: Medicare Other

## 2014-07-12 VITALS — BP 140/90 | Wt 180.0 lb

## 2014-07-12 DIAGNOSIS — I10 Essential (primary) hypertension: Secondary | ICD-10-CM

## 2014-07-12 NOTE — Progress Notes (Signed)
BP was 140/90 and her wrist monitor stated 166/115. She is not currently on any meds for her BP and was trying to control it naturally with fish oil. Dr Moshe Cipro was satisfied with reading and will re-evaluate it again at next visit and start her back on med is no improvement. Hypertension literature Given to review

## 2014-07-15 ENCOUNTER — Telehealth: Payer: Self-pay | Admitting: Family Medicine

## 2014-07-15 NOTE — Telephone Encounter (Signed)
Pls give pt a f/u call on her BP check from earlier this week Let her know I also got the same value sent to me  from nurse visit to her home of 140/90. Also that her ins co keeps sending msgs that she is not collecting her med (that was altace which was stopped) I recommend she take amlodipine 2.5 mg daily as prescribed, to treat her high blood pressure. Also continue the non medicine lifestyle plan for BP managemnt and health , But I recommend that she take the amlodipine every day (morning or pm whichever works best for her at the same time, so she can have her BP managed, treated and maintained within a healthy range. Pls send in script ( re send) if she has no medication  at home

## 2014-07-15 NOTE — Telephone Encounter (Signed)
Patient has decided to try the amlodipine for a week and see if it makes her start back coughing , if so she will call back for a med change

## 2014-07-27 ENCOUNTER — Encounter: Payer: Self-pay | Admitting: Family Medicine

## 2014-07-27 ENCOUNTER — Telehealth: Payer: Self-pay

## 2014-07-27 NOTE — Telephone Encounter (Signed)
Good to hear

## 2014-08-11 ENCOUNTER — Other Ambulatory Visit: Payer: Self-pay | Admitting: Family Medicine

## 2014-08-11 DIAGNOSIS — Z09 Encounter for follow-up examination after completed treatment for conditions other than malignant neoplasm: Secondary | ICD-10-CM

## 2014-09-01 LAB — CBC WITH DIFFERENTIAL/PLATELET
Basophils Absolute: 0 10*3/uL (ref 0.0–0.1)
Basophils Relative: 0 % (ref 0–1)
EOS ABS: 0.1 10*3/uL (ref 0.0–0.7)
EOS PCT: 2 % (ref 0–5)
HEMATOCRIT: 37.9 % (ref 36.0–46.0)
Hemoglobin: 12.8 g/dL (ref 12.0–15.0)
LYMPHS ABS: 2.2 10*3/uL (ref 0.7–4.0)
Lymphocytes Relative: 41 % (ref 12–46)
MCH: 31.1 pg (ref 26.0–34.0)
MCHC: 33.8 g/dL (ref 30.0–36.0)
MCV: 92 fL (ref 78.0–100.0)
MONO ABS: 0.4 10*3/uL (ref 0.1–1.0)
MONOS PCT: 8 % (ref 3–12)
NEUTROS PCT: 49 % (ref 43–77)
Neutro Abs: 2.6 10*3/uL (ref 1.7–7.7)
Platelets: 328 10*3/uL (ref 150–400)
RBC: 4.12 MIL/uL (ref 3.87–5.11)
RDW: 12.9 % (ref 11.5–15.5)
WBC: 5.3 10*3/uL (ref 4.0–10.5)

## 2014-09-01 LAB — BASIC METABOLIC PANEL
BUN: 14 mg/dL (ref 6–23)
CALCIUM: 8.9 mg/dL (ref 8.4–10.5)
CHLORIDE: 104 meq/L (ref 96–112)
CO2: 29 meq/L (ref 19–32)
CREATININE: 0.79 mg/dL (ref 0.50–1.10)
Glucose, Bld: 87 mg/dL (ref 70–99)
POTASSIUM: 4.8 meq/L (ref 3.5–5.3)
Sodium: 140 mEq/L (ref 135–145)

## 2014-09-01 LAB — LIPID PANEL
Cholesterol: 205 mg/dL — ABNORMAL HIGH (ref 0–200)
HDL: 45 mg/dL (ref >39)
LDL CALC: 136 mg/dL — AB (ref 0–99)
TRIGLYCERIDES: 120 mg/dL (ref <150)
Total CHOL/HDL Ratio: 4.6 Ratio
VLDL: 24 mg/dL (ref 0–40)

## 2014-09-06 ENCOUNTER — Ambulatory Visit (INDEPENDENT_AMBULATORY_CARE_PROVIDER_SITE_OTHER): Payer: 59 | Admitting: Family Medicine

## 2014-09-06 ENCOUNTER — Encounter: Payer: Self-pay | Admitting: Family Medicine

## 2014-09-06 VITALS — BP 124/80 | HR 94 | Resp 16 | Ht 63.0 in | Wt 181.0 lb

## 2014-09-06 DIAGNOSIS — M5416 Radiculopathy, lumbar region: Secondary | ICD-10-CM

## 2014-09-06 DIAGNOSIS — M5410 Radiculopathy, site unspecified: Secondary | ICD-10-CM

## 2014-09-06 DIAGNOSIS — I10 Essential (primary) hypertension: Secondary | ICD-10-CM

## 2014-09-06 DIAGNOSIS — E785 Hyperlipidemia, unspecified: Secondary | ICD-10-CM

## 2014-09-06 DIAGNOSIS — Z13820 Encounter for screening for osteoporosis: Secondary | ICD-10-CM

## 2014-09-06 DIAGNOSIS — E559 Vitamin D deficiency, unspecified: Secondary | ICD-10-CM

## 2014-09-06 DIAGNOSIS — G8929 Other chronic pain: Secondary | ICD-10-CM

## 2014-09-06 DIAGNOSIS — E669 Obesity, unspecified: Secondary | ICD-10-CM

## 2014-09-06 DIAGNOSIS — Z23 Encounter for immunization: Secondary | ICD-10-CM

## 2014-09-06 NOTE — Progress Notes (Signed)
   Subjective:    Patient ID: Yvonne Brewer, female    DOB: 03-07-1946, 69 y.o.   MRN: [id number]  HPI The PT is here for follow up and re-evaluation of chronic medical conditions, medication management and review of any available recent lab and radiology data.  Preventive health is updated, specifically  Cancer screening and Immunization.   Questions or concerns regarding consultations or procedures which the PT has had in the interim are  addressed. The PT denies any adverse reactions to current medications since the last visit.  There are no new concerns.  There are no specific complaints      Review of Systems See HPI Denies recent fever or chills. Denies sinus pressure, nasal congestion, ear pain or sore throat. Denies chest congestion, productive cough or wheezing. Denies chest pains, palpitations and leg swelling Denies abdominal pain, nausea, vomiting,diarrhea or constipation.   Denies dysuria, frequency, hesitancy or incontinence. Denies joint pain, swelling and limitation in mobility. Denies headaches, seizures, numbness, or tingling. Denies depression, anxiety or insomnia. Denies skin break down or rash.        Objective:   Physical Exam BP 124/80 mmHg  Pulse 94  Resp 16  Ht 5\' 3"  (1.6 m)  Wt 181 lb (82.101 kg)  BMI 32.07 kg/m2  SpO2 97% Patient alert and oriented and in no cardiopulmonary distress.  HEENT: No facial asymmetry, EOMI,   oropharynx pink and moist.  Neck supple no JVD, no mass.  Chest: Clear to auscultation bilaterally.  CVS: S1, S2 no murmurs, no S3.Regular rate.  ABD: Soft non tender.   Ext: No edema  MS: Adequate ROM spine, shoulders, hips and knees.  Skin: Intact, no ulcerations or rash noted.  Psych: Good eye contact, normal affect. Memory intact not anxious or depressed appearing.  CNS: CN 2-12 intact, power,  normal throughout.no focal deficits noted.        Assessment & Plan:  HTN, goal below 140/90 Controlled, no  change in medication DASH diet and commitment to daily physical activity for a minimum of 30 minutes discussed and encouraged, as a part of hypertension management. The importance of attaining a healthy weight is also discussed.    Chronic radicular pain of lower back No recent flare, no c/o back pain at visit   Hyperlipidemia LDL goal <100 Improved, pt will continue to work on dietary change, and commitment to daily exercise Hyperlipidemia:Low fat diet discussed and encouraged.  \Updated lab needed at/ before next visit.    Obesity (BMI 30.0-34.9) Unchanged Patient re-educated about  the importance of commitment to a  minimum of 150 minutes of exercise per week. The importance of healthy food choices with portion control discussed. Encouraged to start a food diary, count calories and to consider  joining a support group. Sample diet sheets offered. Goals set by the patient for the next several months.      Need for vaccination with 13-polyvalent pneumococcal conjugate vaccine Vaccine administered at visit.

## 2014-09-06 NOTE — Patient Instructions (Addendum)
Annual physical exam in  5 month, call if you need me before  CONGRATS, normal blood pressure and improved cholesterol, keep it up!  Fasting lipid, chem 7  And vit D in 5 month  You are referred for bone density  Test uin May before visit  1500 cal diet sheet provided  60 to 64 ounces water daily  Commit to 30 mins daily exercise pleasePrevnar today

## 2014-09-07 DIAGNOSIS — Z23 Encounter for immunization: Secondary | ICD-10-CM | POA: Insufficient documentation

## 2014-09-07 NOTE — Assessment & Plan Note (Signed)
No recent flare, no c/o back pain at visit

## 2014-09-07 NOTE — Assessment & Plan Note (Signed)
Unchanged. Patient re-educated about  the importance of commitment to a  minimum of 150 minutes of exercise per week. The importance of healthy food choices with portion control discussed. Encouraged to start a food diary, count calories and to consider  joining a support group. Sample diet sheets offered. Goals set by the patient for the next several months.    

## 2014-09-07 NOTE — Assessment & Plan Note (Signed)
Improved, pt will continue to work on dietary change, and commitment to daily exercise Hyperlipidemia:Low fat diet discussed and encouraged.  \Updated lab needed at/ before next visit.

## 2014-09-07 NOTE — Assessment & Plan Note (Signed)
Vaccine administered at visit.  

## 2014-09-07 NOTE — Assessment & Plan Note (Signed)
Controlled, no change in medication DASH diet and commitment to daily physical activity for a minimum of 30 minutes discussed and encouraged, as a part of hypertension management. The importance of attaining a healthy weight is also discussed.  

## 2014-09-27 ENCOUNTER — Ambulatory Visit (HOSPITAL_COMMUNITY)
Admission: RE | Admit: 2014-09-27 | Discharge: 2014-09-27 | Disposition: A | Discharge status: Home / Self Care | Payer: Medicare Other | Source: Ambulatory Visit | Attending: Family Medicine | Admitting: Family Medicine

## 2014-09-27 ENCOUNTER — Other Ambulatory Visit: Payer: Self-pay | Admitting: Family Medicine

## 2014-09-27 DIAGNOSIS — N63 Unspecified lump in breast: Secondary | ICD-10-CM | POA: Diagnosis present

## 2014-09-27 DIAGNOSIS — Z09 Encounter for follow-up examination after completed treatment for conditions other than malignant neoplasm: Secondary | ICD-10-CM

## 2014-10-04 ENCOUNTER — Other Ambulatory Visit (HOSPITAL_COMMUNITY): Payer: Medicare Other

## 2014-10-07 ENCOUNTER — Other Ambulatory Visit: Payer: Self-pay | Admitting: Family Medicine

## 2014-10-07 DIAGNOSIS — Z09 Encounter for follow-up examination after completed treatment for conditions other than malignant neoplasm: Secondary | ICD-10-CM

## 2014-10-11 ENCOUNTER — Other Ambulatory Visit: Payer: Self-pay | Admitting: Family Medicine

## 2014-10-11 ENCOUNTER — Ambulatory Visit (HOSPITAL_COMMUNITY)
Admission: RE | Admit: 2014-10-11 | Discharge: 2014-10-11 | Disposition: A | Discharge status: Home / Self Care | Payer: Medicare Other | Source: Ambulatory Visit | Attending: Family Medicine | Admitting: Family Medicine

## 2014-10-11 DIAGNOSIS — Z09 Encounter for follow-up examination after completed treatment for conditions other than malignant neoplasm: Secondary | ICD-10-CM

## 2014-10-11 DIAGNOSIS — N63 Unspecified lump in breast: Secondary | ICD-10-CM | POA: Diagnosis present

## 2014-10-11 DIAGNOSIS — N6001 Solitary cyst of right breast: Secondary | ICD-10-CM | POA: Insufficient documentation

## 2014-10-11 DIAGNOSIS — N6310 Unspecified lump in the right breast, unspecified quadrant: Secondary | ICD-10-CM

## 2014-10-11 MED ORDER — LIDOCAINE HCL (PF) 2 % IJ SOLN
INTRAMUSCULAR | Status: AC
  Filled 2014-10-11: qty 10

## 2014-10-11 NOTE — Discharge Instructions (Signed)
Breast Cyst A breast cyst is a sac in the breast that is filled with fluid. Breast cysts are common in women. Women can have one or many cysts. When the breasts contain many cysts, it is usually due to a noncancerous (benign) condition called fibrocystic change. These lumps form under the influence of female hormones (estrogen and progesterone). The lumps are most often located in the upper, outer portion of the breast. They are often more swollen, painful, and tender before your period starts. They usually disappear after menopause, unless you are on hormone therapy.  There are several types of cysts:  Macrocyst. This is a cyst that is about 2 in. (5.1 cm) in diameter.   Microcyst. This is a tiny cyst that you cannot feel but can be seen with a mammogram or an ultrasound.   Galactocele. This is a cyst containing milk that may develop if you suddenly stop breastfeeding.   Sebaceous cyst of the skin. This type of cyst is not in the breast tissue itself. Breast cysts do not increase your risk of breast cancer. However, they must be monitored closely because they can be cancerous.  CAUSES  It is not known exactly what causes a breast cyst to form. Possible causes include:  An overgrowth of milk glands and connective tissue in the breast can block the milk glands, causing them to fill with fluid.   Scar tissue in the breast from previous surgery may block the glands, causing a cyst.  RISK FACTORS Estrogen may influence the development of a breast cyst.  SIGNS AND SYMPTOMS   Feeling a smooth, round, soft lump (like a grape) in the breast that is easily moveable.   Breast discomfort or pain.  Increase in size of the lump before your menstrual period and decrease in its size after your menstrual period.  DIAGNOSIS  A cyst can be felt during a physical exam by your health care provider. A breast X-ray exam (mammogram) and ultrasonography will be done to confirm the diagnosis. Fluid may  be removed from the cyst with a needle (fine needle aspiration) to make sure the cyst is not cancerous.  TREATMENT  Treatment may not be necessary. Your health care provider may monitor the cyst to see if it goes away on its own. If treatment is needed, it may include:  Hormone treatment.   Needle aspiration. There is a chance of the cyst coming back after aspiration.   Surgery to remove the whole cyst.  HOME CARE INSTRUCTIONS   Keep all follow-up appointments with your health care provider.  See your health care provider regularly:  Get a yearly exam by your health care provider.  Have a clinical breast exam by a health care provider every 1-3 years if you are 20-40 years of age. After age 40 years, you should have the exam every year.   Get mammogram tests as directed by your health care provider.   Understand the normal appearance and feel of your breasts and perform breast self-exams.   Only take over-the-counter or prescription medicines as directed by your health care provider.   Wear a supportive bra, especially when exercising.   Avoid caffeine.   Reduce your salt intake, especially before your menstrual period. Too much salt can cause fluid retention, breast swelling, and discomfort.  SEEK MEDICAL CARE IF:   You feel, or think you feel, a lump in your breast.   You notice that both breasts look or feel different than usual.   Your   breast is still causing pain after your menstrual period is over.   You need medicine for breast pain and swelling that occurs with your menstrual period.  SEEK IMMEDIATE MEDICAL CARE IF:   You have severe pain, tenderness, redness, or warmth in your breast.   You have nipple discharge or bleeding.   Your breast lump becomes hard and painful.   You find new lumps or bumps that were not there before.   You feel lumps in your armpit (axilla).   You notice dimpling or wrinkling of the breast or nipple.   You  have a fever.  MAKE SURE YOU:  Understand these instructions.  Will watch your condition.  Will get help right away if you are not doing well or get worse. Document Released: 08/12/2005 Document Revised: 04/14/2013 Document Reviewed: 03/11/2013 [employer] Patient Information 2015 [employer]. This information is not intended to replace advice given to you by your health care provider. Make sure you discuss any questions you have with your health care provider.  

## 2014-11-08 ENCOUNTER — Other Ambulatory Visit: Payer: Self-pay

## 2014-11-08 DIAGNOSIS — I10 Essential (primary) hypertension: Secondary | ICD-10-CM

## 2014-11-08 MED ORDER — AMLODIPINE BESYLATE 2.5 MG PO TABS: ORAL_TABLET | ORAL | Status: DC

## 2014-11-16 ENCOUNTER — Ambulatory Visit (INDEPENDENT_AMBULATORY_CARE_PROVIDER_SITE_OTHER): Payer: Medicare Other

## 2014-11-16 ENCOUNTER — Telehealth: Payer: Self-pay | Admitting: Family Medicine

## 2014-11-16 DIAGNOSIS — N3001 Acute cystitis with hematuria: Secondary | ICD-10-CM

## 2014-11-16 LAB — POCT URINALYSIS DIPSTICK
Bilirubin, UA: NEGATIVE
GLUCOSE UA: NEGATIVE
KETONES UA: NEGATIVE
Nitrite, UA: POSITIVE
Protein, UA: 100
SPEC GRAV UA: 1.025
UROBILINOGEN UA: 0.2
pH, UA: 5.5

## 2014-11-16 MED ORDER — CIPROFLOXACIN HCL 500 MG PO TABS: 500.0000 mg | ORAL_TABLET | Freq: Two times a day (BID) | ORAL | Status: DC

## 2014-11-16 NOTE — Progress Notes (Signed)
Patient in with UTI symptoms for nurse visit.  POCT done.  Patient c/o burning with urination, lower back pain and frequency.    Cipro 500mg  BID #6 ordered

## 2014-11-16 NOTE — Telephone Encounter (Signed)
Coming to leave urine (nurse visit)

## 2014-11-18 LAB — URINE CULTURE

## 2014-12-29 ENCOUNTER — Encounter: Payer: Self-pay | Admitting: Family Medicine

## 2015-01-09 ENCOUNTER — Other Ambulatory Visit (HOSPITAL_COMMUNITY): Payer: 59

## 2015-01-16 ENCOUNTER — Ambulatory Visit (HOSPITAL_COMMUNITY)
Admission: RE | Admit: 2015-01-16 | Discharge: 2015-01-16 | Disposition: A | Discharge status: Home / Self Care | Payer: Medicare Other | Source: Ambulatory Visit | Attending: Family Medicine | Admitting: Family Medicine

## 2015-01-16 DIAGNOSIS — Z13820 Encounter for screening for osteoporosis: Secondary | ICD-10-CM

## 2015-02-07 ENCOUNTER — Encounter: Payer: 59 | Admitting: Family Medicine

## 2015-03-06 ENCOUNTER — Other Ambulatory Visit: Payer: Self-pay | Admitting: Family Medicine

## 2015-03-06 DIAGNOSIS — N63 Unspecified lump in unspecified breast: Secondary | ICD-10-CM

## 2015-03-07 IMAGING — MG MM DIGITAL DIAGNOSTIC UNILAT R
1 series · 1 of 1 positions shown · non-contrast
Comparison: Previous exams

CLINICAL DATA: Post aspiration of 2 cysts in the right breast at 12
o'clock.

EXAM:
DIAGNOSTIC RIGHT MAMMOGRAM POST ULTRASOUND ASPIRATION

[R MLO]
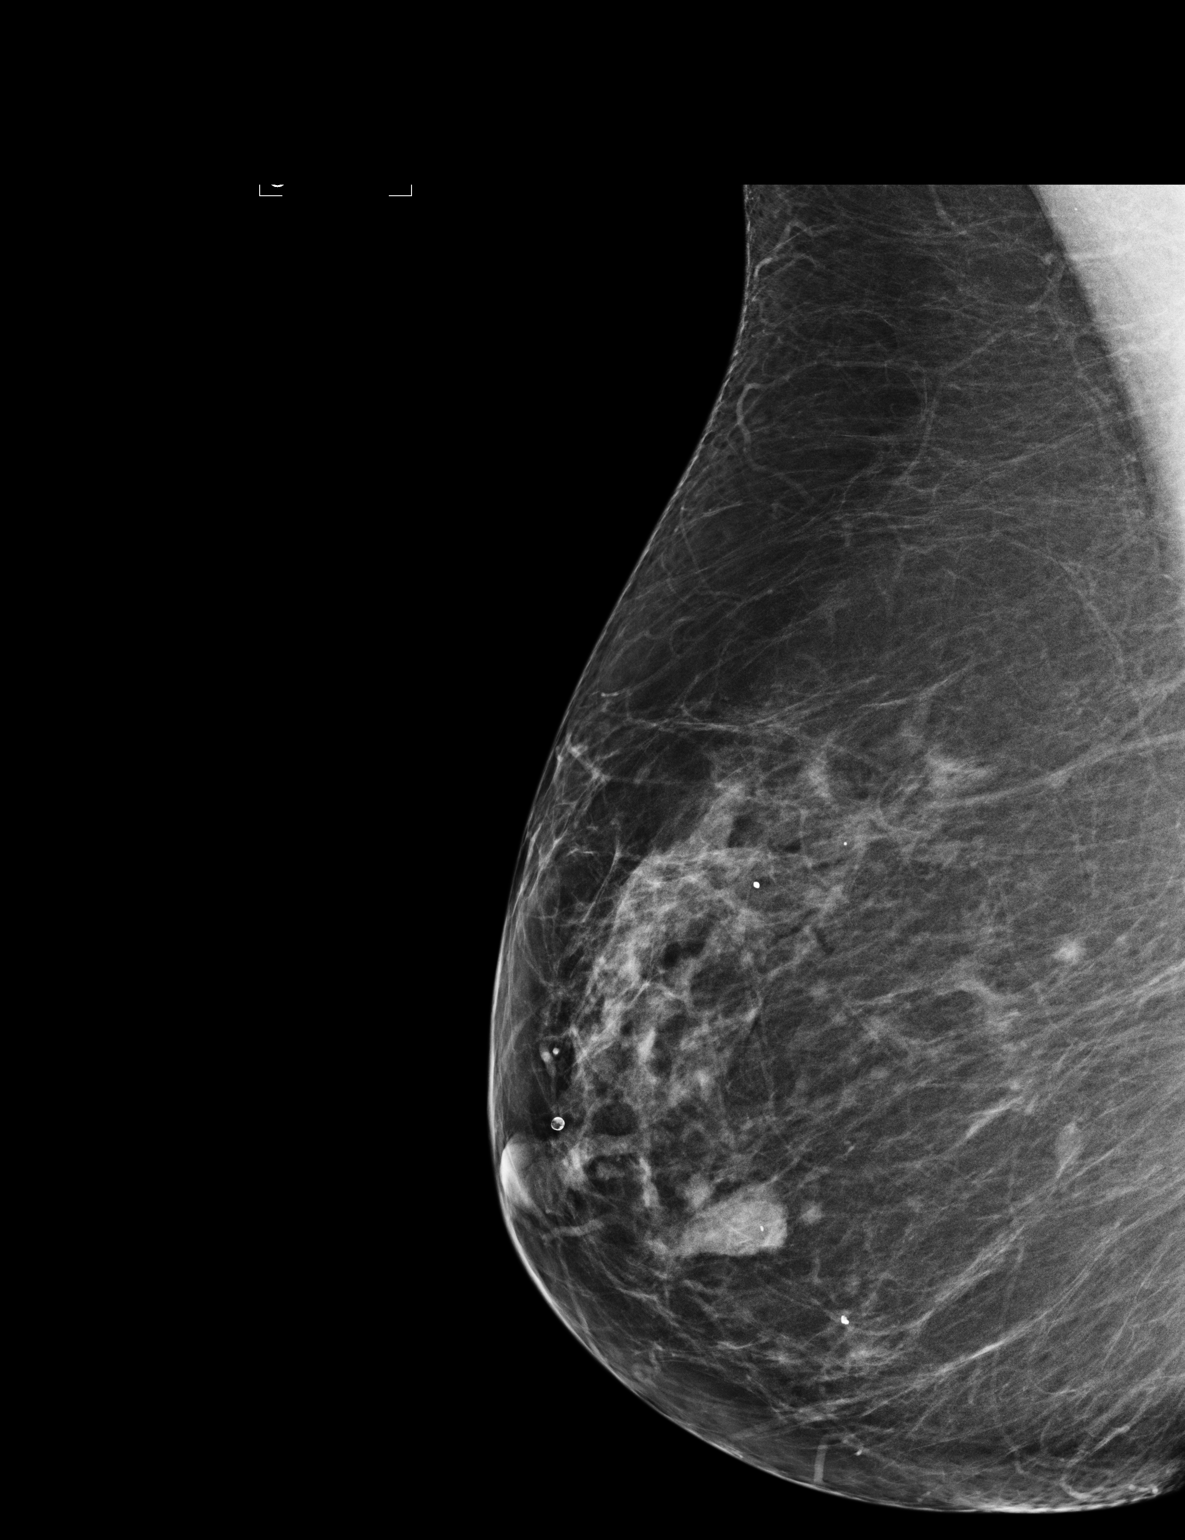

[1 of 1 positions shown; findings below may reference images not displayed]

FINDINGS: Mammographic images were obtained following ultrasound guided
aspiration of a cyst in the right breast at 12 o'clock 1 cm from the
nipple and a cyst in the right breast at 12 o'clock 5 cm from the
nipple. There are persistent nodules in the right breast, also
likely representing additional cysts, with the initially questioned
abnormality in the central right breast best seen on the MLO view
persisting.
IMPRESSION: Persistent probably benign nodule in the central upper right breast
best seen on the MLO view post aspiration of 2 cysts in the right
breast at 12 o'clock.

Recommend six-month followup diagnostic right mammogram (recommend
tomosynthesis if available) and possible right breast ultrasound.

Final Assessment: Post Procedure Mammograms for Marker Placement

## 2015-03-14 ENCOUNTER — Ambulatory Visit (HOSPITAL_COMMUNITY)
Admission: RE | Admit: 2015-03-14 | Discharge: 2015-03-14 | Disposition: A | Discharge status: Home / Self Care | Payer: Medicare Other | Source: Ambulatory Visit | Attending: Family Medicine | Admitting: Family Medicine

## 2015-03-14 DIAGNOSIS — N63 Unspecified lump in unspecified breast: Secondary | ICD-10-CM

## 2015-03-27 ENCOUNTER — Ambulatory Visit (INDEPENDENT_AMBULATORY_CARE_PROVIDER_SITE_OTHER): Payer: Medicare Other | Admitting: Family Medicine

## 2015-03-27 ENCOUNTER — Other Ambulatory Visit: Payer: Self-pay

## 2015-03-27 ENCOUNTER — Encounter: Payer: Self-pay | Admitting: Family Medicine

## 2015-03-27 ENCOUNTER — Telehealth: Payer: Self-pay | Admitting: Family Medicine

## 2015-03-27 VITALS — BP 118/80 | HR 80 | Resp 18 | Ht 63.0 in | Wt 183.0 lb

## 2015-03-27 DIAGNOSIS — Z1211 Encounter for screening for malignant neoplasm of colon: Secondary | ICD-10-CM

## 2015-03-27 DIAGNOSIS — Z0000 Encounter for general adult medical examination without abnormal findings: Secondary | ICD-10-CM

## 2015-03-27 DIAGNOSIS — I10 Essential (primary) hypertension: Secondary | ICD-10-CM

## 2015-03-27 DIAGNOSIS — E785 Hyperlipidemia, unspecified: Secondary | ICD-10-CM

## 2015-03-27 DIAGNOSIS — R0789 Other chest pain: Secondary | ICD-10-CM

## 2015-03-27 DIAGNOSIS — E559 Vitamin D deficiency, unspecified: Secondary | ICD-10-CM

## 2015-03-27 LAB — HEMOCCULT GUIAC POC 1CARD (OFFICE): Fecal Occult Blood, POC: NEGATIVE

## 2015-03-27 MED ORDER — VITAMIN D (ERGOCALCIFEROL) 1.25 MG (50000 UNIT) PO CAPS: 50000.0000 [IU] | ORAL_CAPSULE | ORAL | Status: DC

## 2015-03-27 NOTE — Patient Instructions (Addendum)
Annual wellness in  4 month, call if you need me before  Exam today is good  Please commit to 30 minutes exercise every day for improved health  Please work on weight loss through food choice and portion size  Call for TdAP if you get a cut   Flu vaccine available in September call and come in for vaccine  Continue amlodipine for blood pressure control  Ensure adequate calcium in diet and vitamin D for bone health since bones are thinning  Careful not to fall  Agree with holding off on left 2nd toe surgery UNLESS you want this done  Please try to stop caffeine and also do not eat for 3 hours before lying down and work on weight loss  Gastroesophageal Reflux Disease, Adult Gastroesophageal reflux disease (GERD) happens when acid from your stomach goes into your food pipe (esophagus). The acid can cause a burning feeling in your chest. Over time, the acid can make small holes (ulcers) in your food pipe.  HOME CARE  Ask your doctor for advice about:  Losing weight.  Quitting smoking.  Alcohol use.  Avoid foods and drinks that make your problems worse. You may want to avoid:  Caffeine and alcohol.  Chocolate.  Mints.  Garlic and onions.  Spicy foods.  Citrus fruits, such as oranges, lemons, or limes.  Foods that contain tomato, such as sauce, chili, salsa, and pizza.  Fried and fatty foods.  Avoid lying down for 3 hours before you go to bed or before you take a nap.  Eat small meals often, instead of large meals.  Wear loose-fitting clothing. Do not wear anything tight around your waist.  Raise (elevate) the head of your bed 6 to 8 inches with wood blocks. Using extra pillows does not help.  Only take medicines as told by your doctor.  Do not take aspirin or ibuprofen. GET HELP RIGHT AWAY IF:   You have pain in your arms, neck, jaw, teeth, or back.  Your pain gets worse or changes.  You feel sick to your stomach (nauseous), throw up (vomit), or sweat  (diaphoresis).  You feel short of breath, or you pass out (faint).  Your throw up is green, yellow, black, or looks like coffee grounds or blood.  Your poop (stool) is red, bloody, or black. MAKE SURE YOU:   Understand these instructions.  Will watch your condition.  Will get help right away if you are not doing well or get worse. Document Released: 01/29/2008 Document Revised: 11/04/2011 Document Reviewed: 03/01/2011 [facility] Patient Information 2015 [place], [place]. This information is not intended to replace advice given to you by your health care provider. Make sure you discuss any questions you have with your health care provider.   EKG in office today shows no signs of heart damage or enlargement of the heart, it is entirely normal with normal rhythm , which is great  Aspirin 81 mg one daily recommended for stroke risk reduction for all females over age 69

## 2015-03-27 NOTE — Telephone Encounter (Signed)
Patient aware.

## 2015-03-27 NOTE — Telephone Encounter (Signed)
Called patient and left message for them to return call at the office   

## 2015-03-27 NOTE — Telephone Encounter (Signed)
Cholesterol and blood sugar have both increased, I have writtent thee Jan values beside for comparison, NEEDS dietary change, to improve numbers, health and heart disease and stroke risk  Remind her again odf aspirin and calcium commitment  Please  advise vit D is low, and erx Vit D3 50,000 IU once weekly #4 refill 5, and let pt know .     Will need rept lipid, Vit D , chem7, TSH, cbc, and HBA1C Jan 8 or after  Labs at your station

## 2015-03-27 NOTE — Progress Notes (Signed)
Subjective:    Patient ID: Yvonne Brewer, female    DOB: 1946-01-06, 69 y.o.   MRN: [id number]  HPI Patient is in for annual physical exam.  C/o intermittent substernal chest pain, in the last 3 to 4 months, seems to be aggravated by certain foods, occurs mainly at rest, while supine. Does not smoke and limits caffeine intake. States she knows that CVD will be the cause of her death, risk factors currently include dyslipidemia, IGT and hypertension, also needs to re commit to more regular exercise and weight loss through healthier food choice. She denies PNP, palpitations orthopnea or leg swelling  Immunization is reviewed , and  updated if needed.    Review of Systems See HPI     Objective:   Physical Exam  BP 118/80 mmHg  Pulse 80  Resp 18  Ht 5\' 3"  (1.6 m)  Wt 183 lb (83.008 kg)  BMI 32.43 kg/m2  SpO2 97% Pleasant well nourished female, alert and oriented x 3, in no cardio-pulmonary distress. Afebrile. HEENT No facial trauma or asymetry. Sinuses non tender.  Extra occullar muscles intact, . External ears normal, tympanic membranes clear. Oropharynx moist, no exudate, good dentition. Neck: supple, no adenopathy,JVD or thyromegaly.No bruits.  Chest: Clear to ascultation bilaterally.No crackles or wheezes. Non tender to palpation  Breast: No asymetry,no masses or lumps. No tenderness. No nipple discharge or inversion. No axillary or supraclavicular adenopathy  Cardiovascular system; Heart sounds normal,  S1 and  S2 ,no S3.  No murmur, or thrill. Apical beat not displaced Peripheral pulses normal.  Abdomen: Soft, non tender, no organomegaly or masses. No bruits. Bowel sounds normal. No guarding, tenderness or rebound.  Rectal:  Normal sphincter tone. No mass.No rectal masses.  Guaiac negative stool.  GU: External genitalia normal female genitalia , female distribution of hair. No lesions. Urethral meatus normal in size, 3rd degree bladder Prolapse,  bladder at introitus. der. Vagina pink and moist , with no visible lesions ,  Physiologic discharge present . Adequate pelvic support no  cystocele or rectocele noted Cervix pink and appears healthy, no lesions or ulcerations noted, no discharge noted from os Uterus normal size, no adnexal masses, no cervical motion or adnexal tenderness.   Musculoskeletal exam: Full ROM of spine, hips , shoulders and knees. No deformity ,swelling or crepitus noted of major joints. Marked deformity of 2nd left toe No muscle wasting or atrophy.   Neurologic: Cranial nerves 2 to 12 intact. Power, tone ,sensation and reflexes normal throughout. No disturbance in gait. No tremor.  Skin: Intact, no ulceration, erythema , scaling or rash noted. Pigmentation normal throughout  Psych; Normal mood and affect. Judgement and concentration normal       Assessment & Plan:  Other chest pain Approx 4 month h/o increased substernal discomfort. History and exam consistent with GERD symptoms. EKG done at visit shows NSR, no ischemia or LVH. General advice r egERD prevention discussed, she may also use OTC tums for symptoms. She is toi call if symptoms worsen or persisit  Annual physical exam Annual exam as documented. Counseling done  re healthy lifestyle involving commitment to 150 minutes exercise per week, heart healthy diet, and attaining healthy weight.The importance of adequate sleep also discussed. Regular seat belt use and home safety, is also discussed. Changes in health habits are decided on by the patient with goals and time frames  set for achieving them. Immunization and cancer screening needs are specifically addressed at this visit.

## 2015-04-02 DIAGNOSIS — Z0000 Encounter for general adult medical examination without abnormal findings: Secondary | ICD-10-CM | POA: Insufficient documentation

## 2015-04-02 NOTE — Assessment & Plan Note (Signed)
Approx 4 month h/o increased substernal discomfort. History and exam consistent with GERD symptoms. EKG done at visit shows NSR, no ischemia or LVH. General advice r egERD prevention discussed, she may also use OTC tums for symptoms. She is toi call if symptoms worsen or persisit

## 2015-04-02 NOTE — Assessment & Plan Note (Signed)

## 2015-04-10 ENCOUNTER — Encounter: Payer: Self-pay | Admitting: Family Medicine

## 2015-05-31 ENCOUNTER — Other Ambulatory Visit: Payer: Self-pay | Admitting: Family Medicine

## 2015-06-30 ENCOUNTER — Other Ambulatory Visit: Payer: Self-pay

## 2015-06-30 ENCOUNTER — Telehealth: Payer: Self-pay | Admitting: Orthopedic Surgery

## 2015-06-30 MED ORDER — AMLODIPINE BESYLATE 2.5 MG PO TABS: ORAL_TABLET | ORAL | Status: DC

## 2015-06-30 NOTE — Telephone Encounter (Signed)
Patient is asking for a refill on her amLODipine (NORVASC) 2.5 MG tablet  Please advise?

## 2015-06-30 NOTE — Telephone Encounter (Signed)
Medication sent.

## 2015-07-05 ENCOUNTER — Telehealth: Payer: Self-pay | Admitting: Family Medicine

## 2015-07-05 ENCOUNTER — Other Ambulatory Visit: Payer: Self-pay

## 2015-07-05 MED ORDER — AMLODIPINE BESYLATE 2.5 MG PO TABS: ORAL_TABLET | ORAL | Status: DC

## 2015-07-05 NOTE — Telephone Encounter (Signed)
Patient is calling stating that the pharmacy is stating they have not received her refill from [place], please advise?

## 2015-07-05 NOTE — Telephone Encounter (Signed)
Called and left message for patient notifying that rx has been resent to [facility].

## 2015-07-27 ENCOUNTER — Other Ambulatory Visit: Payer: Self-pay | Admitting: Family Medicine

## 2015-07-27 LAB — CBC WITH DIFFERENTIAL/PLATELET
Basophils Absolute: 0.1 10*3/uL (ref 0.0–0.1)
Basophils Relative: 1 % (ref 0–1)
EOS ABS: 0.1 10*3/uL (ref 0.0–0.7)
EOS PCT: 2 % (ref 0–5)
HCT: 40 % (ref 36.0–46.0)
Hemoglobin: 13.4 g/dL (ref 12.0–15.0)
LYMPHS ABS: 2.5 10*3/uL (ref 0.7–4.0)
Lymphocytes Relative: 41 % (ref 12–46)
MCH: 31.6 pg (ref 26.0–34.0)
MCHC: 33.5 g/dL (ref 30.0–36.0)
MCV: 94.3 fL (ref 78.0–100.0)
MONOS PCT: 8 % (ref 3–12)
MPV: 9.8 fL (ref 8.6–12.4)
Monocytes Absolute: 0.5 10*3/uL (ref 0.1–1.0)
Neutro Abs: 2.9 10*3/uL (ref 1.7–7.7)
Neutrophils Relative %: 48 % (ref 43–77)
PLATELETS: 300 10*3/uL (ref 150–400)
RBC: 4.24 MIL/uL (ref 3.87–5.11)
RDW: 13.3 % (ref 11.5–15.5)
WBC: 6 10*3/uL (ref 4.0–10.5)

## 2015-07-28 LAB — HEMOGLOBIN A1C
Hgb A1c MFr Bld: 5.6 % (ref <5.7)
Mean Plasma Glucose: 114 mg/dL (ref <117)

## 2015-07-28 LAB — BASIC METABOLIC PANEL
BUN: 14 mg/dL (ref 7–25)
CO2: 25 mmol/L (ref 20–31)
CREATININE: 0.75 mg/dL (ref 0.50–0.99)
Calcium: 9.2 mg/dL (ref 8.6–10.4)
Chloride: 105 mmol/L (ref 98–110)
Glucose, Bld: 89 mg/dL (ref 65–99)
Potassium: 4.4 mmol/L (ref 3.5–5.3)
Sodium: 139 mmol/L (ref 135–146)

## 2015-07-28 LAB — LIPID PANEL
Cholesterol: 232 mg/dL — ABNORMAL HIGH (ref 125–200)
HDL: 50 mg/dL (ref >=46)
LDL Cholesterol: 147 mg/dL — ABNORMAL HIGH (ref <130)
TRIGLYCERIDES: 174 mg/dL — AB (ref <150)
Total CHOL/HDL Ratio: 4.6 Ratio (ref <=5.0)
VLDL: 35 mg/dL — ABNORMAL HIGH (ref <30)

## 2015-07-28 LAB — VITAMIN D 25 HYDROXY (VIT D DEFICIENCY, FRACTURES): Vit D, 25-Hydroxy: 22 ng/mL — ABNORMAL LOW (ref 30–100)

## 2015-07-31 ENCOUNTER — Encounter: Payer: Self-pay | Admitting: Family Medicine

## 2015-07-31 ENCOUNTER — Ambulatory Visit (INDEPENDENT_AMBULATORY_CARE_PROVIDER_SITE_OTHER): Payer: Medicare Other | Admitting: Family Medicine

## 2015-07-31 VITALS — BP 122/84 | HR 86 | Resp 16 | Ht 63.0 in | Wt 184.0 lb

## 2015-07-31 DIAGNOSIS — E785 Hyperlipidemia, unspecified: Secondary | ICD-10-CM

## 2015-07-31 DIAGNOSIS — E66811 Obesity, class 1: Secondary | ICD-10-CM

## 2015-07-31 DIAGNOSIS — E559 Vitamin D deficiency, unspecified: Secondary | ICD-10-CM

## 2015-07-31 DIAGNOSIS — E669 Obesity, unspecified: Secondary | ICD-10-CM | POA: Diagnosis not present

## 2015-07-31 DIAGNOSIS — I10 Essential (primary) hypertension: Secondary | ICD-10-CM

## 2015-07-31 DIAGNOSIS — G8929 Other chronic pain: Secondary | ICD-10-CM

## 2015-07-31 DIAGNOSIS — M5416 Radiculopathy, lumbar region: Secondary | ICD-10-CM

## 2015-07-31 DIAGNOSIS — M5410 Radiculopathy, site unspecified: Secondary | ICD-10-CM

## 2015-07-31 LAB — TSH: TSH: 2.083 u[IU]/mL (ref 0.350–4.500)

## 2015-07-31 LAB — HEPATITIS C ANTIBODY: HCV Ab: NEGATIVE

## 2015-07-31 MED ORDER — AMLODIPINE BESYLATE 2.5 MG PO TABS: 2.5000 mg | ORAL_TABLET | Freq: Every day | ORAL | Status: DC

## 2015-07-31 NOTE — Assessment & Plan Note (Signed)
imcreased along with increased stiffness in hips, no instability, but increased difficulty climbing stairs. Advisedincrease both strengthening and aerobic exercise , weight loss and yoga

## 2015-07-31 NOTE — Assessment & Plan Note (Signed)
Needs to commit to daily vit D3 1000IU

## 2015-07-31 NOTE — Assessment & Plan Note (Signed)
Controlled, no change in medication DASH diet and commitment to daily physical activity for a minimum of 30 minutes discussed and encouraged, as a part of hypertension management. The importance of attaining a healthy weight is also discussed.  BP/Weight 07/31/2015 03/27/2015 09/06/2014 07/12/2014 06/28/2014 06/02/2014 [id number]  Systolic BP 123XX123 [id number] 123XX123 123456 [id number]  Diastolic BP 84 80 80 90 80 80 90  Wt. (Lbs) 184 183 181 180 - 182.2 182.4  BMI 32.6 32.43 32.07 32.13 - 32.53 32.56

## 2015-07-31 NOTE — Progress Notes (Signed)
Subjective:    Patient ID: Yvonne Brewer, female    DOB: [birth year], 69 y.o.   MRN: [id number]  HPI   Yvonne Brewer     MRN: [id number]      DOB: [birth year]   HPI Yvonne Brewer is here for follow up and re-evaluation of chronic medical conditions, medication management and review of any available recent lab and radiology data.  Preventive health is updated, specifically  Cancer screening and Immunization.   Questions or concerns regarding consultations or procedures which the PT has had in the interim are  Addressed.Saw ortho last week re right foot pain with limp and toe deformity, had initially planned on surgery, but changed her mind he night before, was not recommended by ortho either The PT denies any adverse reactions to current medications since the last visit.  Noted increased pain and stiffness in hips and at times low back, esp on initiation of movement, also increased nocturia but drinks fluid too late  ROS Denies recent fever or chills. Denies sinus pressure, nasal congestion, ear pain or sore throat. Denies chest congestion, productive cough or wheezing. Denies chest pains, palpitations and leg swelling Denies abdominal pain, nausea, vomiting,diarrhea or constipation.   Denies dysuria,, hesitancy or incontinence. Denies headaches, seizures, numbness, or tingling. Denies depression, anxiety or insomnia. Denies skin break down or rash.   PE  BP 122/84 mmHg  Pulse 86  Resp 16  Ht 5\' 3"  (1.6 m)  Wt 184 lb (83.462 kg)  BMI 32.60 kg/m2  SpO2 97%  Patient alert and oriented and in no cardiopulmonary distress.  HEENT: No facial asymmetry, EOMI,   oropharynx pink and moist.  Neck supple no JVD, no mass.  Chest: Clear to auscultation bilaterally.  CVS: S1, S2 no murmurs, no S3.Regular rate.  ABD: Soft non tender.   Ext: No edema  MS: Adequate ROM spine, shoulders, hips and knees.left foot deformity, hammer toe of 2nd toe Mild limp and stiffness noted on  initial steps  Skin: Intact, no ulcerations or rash noted.  Psych: Good eye contact, normal affect. Memory intact not anxious or depressed appearing.  CNS: CN 2-12 intact, power,  normal throughout.no focal deficits noted.   Assessment & Plan   HTN, goal below 140/90 Controlled, no change in medication DASH diet and commitment to daily physical activity for a minimum of 30 minutes discussed and encouraged, as a part of hypertension management. The importance of attaining a healthy weight is also discussed.  BP/Weight 07/31/2015 03/27/2015 09/06/2014 07/12/2014 06/28/2014 06/02/2014 [id number]  Systolic BP 123XX123 123456 [id number] 123XX123 123456 [id number]  Diastolic BP 84 80 80 90 80 80 90  Wt. (Lbs) 184 183 181 180 - 182.2 182.4  BMI 32.6 32.43 32.07 32.13 - 32.53 32.56        Hyperlipidemia LDL goal <100 Deteririorated,needs to reduce fat in diet more consistently  Updated lab needed at/ before next visit.   Obesity (BMI 30.0-34.9) Deteriorated. Patient re-educated about  the importance of commitment to a  minimum of 150 minutes of exercise per week.  The importance of healthy food choices with portion control discussed. Encouraged to start a food diary, count calories and to consider  joining a support group. Sample diet sheets offered. Goals set by the patient for the next several months.   Weight /BMI 07/31/2015 03/27/2015 09/06/2014  WEIGHT 184 lb 183 lb 181 lb  HEIGHT 5\' 3"  5\' 3"  5\' 3"   BMI 32.6 kg/m2 32.43 kg/m2 32.07 kg/m2  Current exercise per week 90 minutes.   Vitamin D deficiency Needs to commit to daily vit D3 1000IU  Chronic radicular pain of lower back imcreased along with increased stiffness in hips, no instability, but increased difficulty climbing stairs. Advisedincrease both strengthening and aerobic exercise , weight loss and yoga       Review of Systems     Objective:   Physical Exam        Assessment & Plan:

## 2015-07-31 NOTE — Assessment & Plan Note (Signed)
Deteriorated. Patient re-educated about  the importance of commitment to a  minimum of 150 minutes of exercise per week.  The importance of healthy food choices with portion control discussed. Encouraged to start a food diary, count calories and to consider  joining a support group. Sample diet sheets offered. Goals set by the patient for the next several months.   Weight /BMI 07/31/2015 03/27/2015 09/06/2014  WEIGHT 184 lb 183 lb 181 lb  HEIGHT 5\' 3"  5\' 3"  5\' 3"   BMI 32.6 kg/m2 32.43 kg/m2 32.07 kg/m2    Current exercise per week 90 minutes.

## 2015-07-31 NOTE — Patient Instructions (Addendum)
Annual wellness in 6 month, call if you need me before  I agree with no foot surgery  Please work on committed stretch exercises as well as dietary change as discussed  New additional OTC medication vitamin D 1000  IU once daily  Fasting lipid, chem 7  in 5.5 month  Thanks for choosing [facility], we consider it a privelige to serve you.   All the best for 2017!

## 2015-07-31 NOTE — Assessment & Plan Note (Signed)
Deteririorated,needs to reduce fat in diet more consistently  Updated lab needed at/ before next visit.

## 2015-08-30 ENCOUNTER — Telehealth: Payer: Self-pay | Admitting: Family Medicine

## 2015-08-30 NOTE — Telephone Encounter (Signed)
Patient is needing a refill on her Vitamin D and she is asking if she could take a daily dose vs monthly, please advise?

## 2015-08-30 NOTE — Telephone Encounter (Signed)
Called and left message for patient to return call.  She can take either

## 2015-08-31 NOTE — Telephone Encounter (Signed)
Patient in and will call if she needs a prescription sent.

## 2015-10-05 ENCOUNTER — Other Ambulatory Visit: Payer: Self-pay | Admitting: Family Medicine

## 2015-10-05 DIAGNOSIS — I10 Essential (primary) hypertension: Secondary | ICD-10-CM

## 2015-10-05 MED ORDER — AMLODIPINE BESYLATE 2.5 MG PO TABS: 2.5000 mg | ORAL_TABLET | Freq: Every day | ORAL | Status: DC

## 2015-10-05 NOTE — Telephone Encounter (Signed)
Refill sent.

## 2015-10-05 NOTE — Telephone Encounter (Signed)
Patient is asking for a refill today on her amLODipine (NORVASC) 2.5 MG tablet called to [facility], please advise?

## 2015-10-24 ENCOUNTER — Encounter (HOSPITAL_COMMUNITY)
Admission: RE | Disposition: A | Discharge status: Home / Self Care | Payer: Self-pay | Source: Ambulatory Visit | Attending: Ophthalmology

## 2015-10-24 ENCOUNTER — Ambulatory Visit (HOSPITAL_COMMUNITY)
Admission: RE | Admit: 2015-10-24 | Discharge: 2015-10-24 | Disposition: A | Discharge status: Home / Self Care | Payer: Medicare Other | Source: Ambulatory Visit | Attending: Ophthalmology | Admitting: Ophthalmology

## 2015-10-24 DIAGNOSIS — Z79899 Other long term (current) drug therapy: Secondary | ICD-10-CM | POA: Diagnosis not present

## 2015-10-24 DIAGNOSIS — H2640 Unspecified secondary cataract: Secondary | ICD-10-CM | POA: Insufficient documentation

## 2015-10-24 DIAGNOSIS — I10 Essential (primary) hypertension: Secondary | ICD-10-CM | POA: Diagnosis not present

## 2015-10-24 HISTORY — PX: YAG LASER APPLICATION: SHX6189

## 2015-10-24 SURGERY — TREATMENT, USING YAG LASER
Anesthesia: LOCAL | Laterality: Left

## 2015-10-24 MED ORDER — TROPICAMIDE 1 % OP SOLN
1.0000 [drp] | OPHTHALMIC | Status: AC
  Administered 2015-10-24 (×2): 1 [drp] via OPHTHALMIC

## 2015-10-24 MED ORDER — TROPICAMIDE 1 % OP SOLN
OPHTHALMIC | Status: AC
  Filled 2015-10-24: qty 3

## 2015-10-24 NOTE — Discharge Instructions (Signed)
EMARA SCHROM  10/24/2015     Instructions    Activity: No Restrictions.   Diet: Resume Diet you were on at home.   Pain Medication: Tylenol if Needed.   CONTACT YOUR DOCTOR IF YOU HAVE PAIN, REDNESS IN YOUR EYE, OR DECREASED VISION.   Follow-up:today with Rutherford Guys, MD.   Dr. Gershon Crane: [phone]  Dr. Iona HansenJI:[id number]  Dr. Geoffry ParadiseID:[id number]   If you find that you cannot contact your physician, but feel that your signs and   Symptoms warrant a physician's attention, call the Emergency Room at   [phone] [phone].   Othern/a.  FOLLOW UP WITH DR. SHAPIRO TODAY AT HIS OFFICE AT 1:00 pm

## 2015-10-24 NOTE — H&P (Signed)
The patient was re examined and there is no change in the patients condition since the original H and P. 

## 2015-10-24 NOTE — Op Note (Signed)
Yvonne Brewer T. Gershon Crane, MD  Procedure: Yag Capsulotomy  Yag Laser Self Test Completedyes. Procedure: Posterior Capsulotomy, Eye Protection Worn by Staff yes. Laser In Use Sign on Door yes.  Laser: Nd:YAG Spot Size: Fixed Burst Mode: III Power Setting: 3.2 mJ/burst Number of shots: 20 Total energy delivered: 61.14 mJ   The patient tolerated the procedure without difficulty. No complications were encountered.   The patient was discharged home with the instructions to continue all her current glaucoma medications, if any.   Patient instructed to go to office at 0100 for intraocular pressure check.  Patient verbalizes understanding of discharge instructions Yes.  .    Pre-Operative Diagnosis: After-Cataract, obscuring vision, 366.53 OS Post-Operative Diagnosis: After-Cataract, obscuring vision, 366.53 OS Date of Cataract Surgery: 12/20/2009

## 2015-10-25 ENCOUNTER — Encounter (HOSPITAL_COMMUNITY): Payer: Self-pay | Admitting: Ophthalmology

## 2015-11-07 ENCOUNTER — Encounter (HOSPITAL_COMMUNITY)
Admission: RE | Disposition: A | Discharge status: Home / Self Care | Payer: Self-pay | Source: Ambulatory Visit | Attending: Ophthalmology

## 2015-11-07 ENCOUNTER — Ambulatory Visit (HOSPITAL_COMMUNITY)
Admission: RE | Admit: 2015-11-07 | Discharge: 2015-11-07 | Disposition: A | Discharge status: Home / Self Care | Payer: Medicare Other | Source: Ambulatory Visit | Attending: Ophthalmology | Admitting: Ophthalmology

## 2015-11-07 DIAGNOSIS — Z882 Allergy status to sulfonamides status: Secondary | ICD-10-CM | POA: Diagnosis not present

## 2015-11-07 DIAGNOSIS — Z79899 Other long term (current) drug therapy: Secondary | ICD-10-CM | POA: Diagnosis not present

## 2015-11-07 DIAGNOSIS — I10 Essential (primary) hypertension: Secondary | ICD-10-CM | POA: Insufficient documentation

## 2015-11-07 DIAGNOSIS — H2640 Unspecified secondary cataract: Secondary | ICD-10-CM | POA: Insufficient documentation

## 2015-11-07 HISTORY — PX: YAG LASER APPLICATION: SHX6189

## 2015-11-07 SURGERY — TREATMENT, USING YAG LASER
Anesthesia: LOCAL | Laterality: Right

## 2015-11-07 MED ORDER — TROPICAMIDE 1 % OP SOLN
2.0000 [drp] | OPHTHALMIC | Status: AC
Start: 2015-11-07 — End: 2015-11-07
  Administered 2015-11-07: 2 [drp] via OPHTHALMIC

## 2015-11-07 MED ORDER — CYCLOPENTOLATE-PHENYLEPHRINE OP SOLN OPTIME - NO CHARGE
OPHTHALMIC | Status: AC
  Filled 2015-11-07: qty 2

## 2015-11-07 MED ORDER — TROPICAMIDE 1 % OP SOLN
OPHTHALMIC | Status: AC
  Filled 2015-11-07: qty 3

## 2015-11-07 NOTE — Op Note (Signed)
Kerria Sapien T. Gershon Crane, MD  Procedure: Yag Capsulotomy  Yag Laser Self Test Completedyes. Procedure: Posterior Capsulotomy, Eye Protection Worn by Staff yes. Laser In Use Sign on Door yes.  Laser: Nd:YAG Spot Size: Fixed Burst Mode: III Power Setting: 3.4 mJ/burst Number of shots: 18 Total energy delivered: 54.30 mJ   The patient tolerated the procedure without difficulty. No complications were encountered.   The patient was discharged home with the instructions to continue all her current glaucoma medications, if any.   Patient instructed to go to office at 0300 for intraocular pressure check.  Patient verbalizes understanding of discharge instructions Yes.  .    Pre-Operative Diagnosis: After-Cataract, obscuring vision, 366.53 OD Post-Operative Diagnosis: After-Cataract, obscuring vision, 366.53 OD Date of Cataract Surgery: 11/22/2009

## 2015-11-07 NOTE — Discharge Instructions (Signed)
AVIONA CLUTE  11/07/2015     Instructions    Activity: No Restrictions.   Diet: Resume Diet you were on at home.   Pain Medication: Tylenol if Needed.   CONTACT YOUR DOCTOR IF YOU HAVE PAIN, REDNESS IN YOUR EYE, OR DECREASED VISION.   Follow-up: with Rutherford Guys, MD at 3 PM today    Dr. Gershon Crane: [phone]     If you find that you cannot contact your physician, but feel that your signs and   Symptoms warrant a physician's attention, call the Emergency Room at   [phone] ext.[phone].

## 2015-11-07 NOTE — H&P (Signed)
The patient was re examined and there is no change in the patients condition since the original H and P. 

## 2015-11-08 ENCOUNTER — Encounter (HOSPITAL_COMMUNITY): Payer: Self-pay | Admitting: Ophthalmology

## 2015-12-21 ENCOUNTER — Ambulatory Visit (INDEPENDENT_AMBULATORY_CARE_PROVIDER_SITE_OTHER): Payer: Medicare Other | Admitting: Orthopaedic Surgery

## 2015-12-21 ENCOUNTER — Ambulatory Visit (INDEPENDENT_AMBULATORY_CARE_PROVIDER_SITE_OTHER): Payer: Medicare Other

## 2015-12-21 ENCOUNTER — Encounter: Payer: Self-pay | Admitting: Orthopaedic Surgery

## 2015-12-21 VITALS — BP 120/79 | HR 89 | Temp 97.7°F | Ht 62.0 in | Wt 181.4 lb

## 2015-12-21 DIAGNOSIS — M25552 Pain in left hip: Secondary | ICD-10-CM

## 2015-12-21 DIAGNOSIS — M25551 Pain in right hip: Secondary | ICD-10-CM

## 2015-12-21 NOTE — Progress Notes (Signed)
Subjective:  My hips hurt    Patient ID: Yvonne Brewer, female    DOB: 12-Jun-1946, 70 y.o.   MRN: [id number]  Hip Pain  There was no injury mechanism. The pain is present in the left hip and right hip. The quality of the pain is described as aching. The pain is at a severity of 3/10. The pain is mild. The pain has been fluctuating since onset. Associated symptoms include a loss of motion. Pertinent negatives include no inability to bear weight, loss of sensation, muscle weakness, numbness or tingling. The symptoms are aggravated by weight bearing. She has tried acetaminophen, NSAIDs and rest for the symptoms. The treatment provided moderate relief.   She has pain in both hips, more early in the morning.  The pain gets better as she gets moving around.  She has no numbness, no trauma.  This is gradually getting worse.  She has taken an Advil or two Advil but no other NSAID.  She is concerned this is slowly worsening.  Other major joints do not hurt.  She has overlapping of the second toe on the left foot over the great toe.  She has seen another physician for this and pointed it out to me.  I gave some recommendations.  She may need surgery.    Review of Systems  Neurological: Negative for tingling and numbness.   Past Medical History  Diagnosis Date  . Obesity   . Cystitis, unspecified   . Female bladder prolapse   . Cancer ([place])     skin cancer  . Hypertension 2015    staring medication 02/2014  . Hyperlipidemia     diet managed , but uncontrolled    Past Surgical History  Procedure Laterality Date  . Left bunionectomy  2002    . Squamous cell ca rt ant chest 1989    . Colonoscopy  11/06/2011    Procedure: COLONOSCOPY;  Surgeon: Rogene Houston, MD;  [facility];  Service: Endoscopy;  Laterality: N/A;  7:30  . Cataract extraction  2012    bilateral   . Eye surgery  2011    bilateral cataract with implants  . Yag laser application Left [id number]    Procedure:  YAG LASER APPLICATION;  Surgeon: Rutherford Guys, MD;  Location: [facility];  Service: Ophthalmology;  Laterality: Left;  . Yag laser application Right [id number]    Procedure: YAG LASER APPLICATION;  Surgeon: Rutherford Guys, MD;  Location: [facility];  Service: Ophthalmology;  Laterality: Right;    Current Outpatient Prescriptions on File Prior to Visit  Medication Sig Dispense Refill  . amLODipine (NORVASC) 2.5 MG tablet Take 1 tablet (2.5 mg total) by mouth daily. 90 tablet 3  . calcium carbonate (TUMS - DOSED IN MG ELEMENTAL CALCIUM) 500 MG chewable tablet Chew 1-2 tablets by mouth daily.    . ergocalciferol (VITAMIN D2) 50000 units capsule Take 50,000 Units by mouth once a week.    Marland Kitchen ibuprofen (ADVIL,MOTRIN) 200 MG tablet Take 200 mg by mouth daily.     No current facility-administered medications on file prior to visit.    Social History   Social History  . Marital Status: Married    Spouse Name: N/A  . Number of Children: N/A  . Years of Education: N/A   Occupational History  . Not on file.   Social History Main Topics  . Smoking status: Never Smoker   . Smokeless tobacco: Not on file  . Alcohol Use: Yes  Comment: glass of red wine most nights  . Drug Use: No  . Sexual Activity: Yes    Birth Control/ Protection: Post-menopausal   Other Topics Concern  . Not on file   Social History Narrative    BP 120/79 mmHg  Pulse 89  Temp(Src) 97.7 F (36.5 C)  Ht 5\' 2"  (1.575 m)  Wt 181 lb 6.4 oz (82.283 kg)  BMI 33.17 kg/m2      Objective:   Physical Exam  Constitutional: She is oriented to person, place, and time. She appears well-developed and well-nourished.  HENT:  Head: Normocephalic and atraumatic.  Eyes: Conjunctivae and EOM are normal. Pupils are equal, round, and reactive to light.  Neck: Normal range of motion. Neck supple.  Cardiovascular: Normal rate, regular rhythm and intact distal pulses.   Pulmonary/Chest: Effort normal.  Abdominal: Soft.  Musculoskeletal:  She exhibits tenderness (The hips are just slightly tender, both flex to 90, extend 15, abduct and adduct fully, internal is 20 on the right and  25 on the left, external is 20 equally. There is no limp in walking.  ).  Neurological: She is alert and oriented to person, place, and time. She displays normal reflexes. No cranial nerve deficit. She exhibits normal muscle tone. Coordination normal.  Skin: Skin is warm and dry.  Psychiatric: She has a normal mood and affect. Her behavior is normal. Judgment and thought content normal.   X-rays were done and reported separately.     Assessment & Plan:   Encounter Diagnosis  Name Primary?  . Bilateral hip pain Yes   I have recommended Aleve one or two a day after eating.  If she is going to be doing considerable walking, the take Aleve bid for that day and even the day before if she knows she will be active.  Precautions given.  Call if any problem.  I will see as needed.

## 2016-01-25 HISTORY — PX: HAMMER TOE SURGERY: SHX385

## 2016-02-20 ENCOUNTER — Telehealth: Payer: Self-pay

## 2016-02-20 DIAGNOSIS — I10 Essential (primary) hypertension: Secondary | ICD-10-CM

## 2016-02-20 DIAGNOSIS — E785 Hyperlipidemia, unspecified: Secondary | ICD-10-CM

## 2016-02-20 LAB — BASIC METABOLIC PANEL
BUN: 22 mg/dL (ref 7–25)
CHLORIDE: 102 mmol/L (ref 98–110)
CO2: 26 mmol/L (ref 20–31)
CREATININE: 0.89 mg/dL (ref 0.60–0.93)
Calcium: 9.2 mg/dL (ref 8.6–10.4)
GLUCOSE: 103 mg/dL — AB (ref 65–99)
Potassium: 4.7 mmol/L (ref 3.5–5.3)
Sodium: 140 mmol/L (ref 135–146)

## 2016-02-20 LAB — LIPID PANEL
CHOLESTEROL: 221 mg/dL — AB (ref 125–200)
HDL: 53 mg/dL (ref >=46)
LDL Cholesterol: 132 mg/dL — ABNORMAL HIGH (ref <130)
TRIGLYCERIDES: 181 mg/dL — AB (ref <150)
Total CHOL/HDL Ratio: 4.2 Ratio (ref <=5.0)
VLDL: 36 mg/dL — ABNORMAL HIGH (ref <30)

## 2016-02-20 LAB — TSH: TSH: 1.91 mIU/L

## 2016-02-20 NOTE — Telephone Encounter (Signed)
Labs ordered.

## 2016-02-21 ENCOUNTER — Encounter: Payer: Self-pay | Admitting: Family Medicine

## 2016-02-21 ENCOUNTER — Ambulatory Visit (INDEPENDENT_AMBULATORY_CARE_PROVIDER_SITE_OTHER): Payer: Medicare Other | Admitting: Family Medicine

## 2016-02-21 VITALS — BP 122/80 | HR 90 | Resp 16 | Ht 62.0 in | Wt 185.0 lb

## 2016-02-21 DIAGNOSIS — Z0000 Encounter for general adult medical examination without abnormal findings: Secondary | ICD-10-CM

## 2016-02-21 DIAGNOSIS — E559 Vitamin D deficiency, unspecified: Secondary | ICD-10-CM | POA: Diagnosis not present

## 2016-02-21 DIAGNOSIS — Z0001 Encounter for general adult medical examination with abnormal findings: Secondary | ICD-10-CM | POA: Insufficient documentation

## 2016-02-21 DIAGNOSIS — I10 Essential (primary) hypertension: Secondary | ICD-10-CM

## 2016-02-21 DIAGNOSIS — Z1231 Encounter for screening mammogram for malignant neoplasm of breast: Secondary | ICD-10-CM

## 2016-02-21 DIAGNOSIS — E785 Hyperlipidemia, unspecified: Secondary | ICD-10-CM

## 2016-02-21 DIAGNOSIS — R7301 Impaired fasting glucose: Secondary | ICD-10-CM

## 2016-02-21 NOTE — Assessment & Plan Note (Signed)

## 2016-02-21 NOTE — Progress Notes (Signed)
Subjective:    Patient ID: Yvonne Brewer, female    DOB: 1945-09-29, 70 y.o.   MRN: [id number]  HPI Preventive Screening-Counseling & Management   Patient present here today for a Medicare annual wellness visit.   Current Problems (verified)   Medications Prior to Visit Allergies (verified)   PAST HISTORY  Family History (verified)  Social History Married, retired from the education system, never smoker    Risk Factors  Current exercise habits:  Usually does water aerobics/walking but not able right now due to foot surgery   Dietary issues discussed: heart healthy diet encouraged, eats a lot of salad, tries to avoid carbs and sweets and limits fried foods and portions    Cardiac risk factors: none significant   Depression Screen  (Note: if answer to either of the following is "Yes", a more complete depression screening is indicated)   Over the past two weeks, have you felt down, depressed or hopeless? No  Over the past two weeks, have you felt little interest or pleasure in doing things? No  Have you lost interest or pleasure in daily life? No  Do you often feel hopeless? No  Do you cry easily over simple problems? No   Activities of Daily Living  In your present state of health, do you have any difficulty performing the following activities?  Driving?: No Managing money?: No Feeding yourself?:No Getting from bed to chair?:No Climbing a flight of stairs?: can climb stairs but limited now due to recent foot surgery  Preparing food and eating?:No Bathing or showering?:No Getting dressed?:No Getting to the toilet?:No Using the toilet?:No Moving around from place to place?: No  Fall Risk Assessment In the past year have you fallen or had a near fall?:No Are you currently taking any medications that make you dizzy?:No   Hearing Difficulties: No Do you often ask people to speak up or repeat themselves?:No Do you experience ringing or noises in your  ears?:No Do you have difficulty understanding soft or whispered voices?:No  Cognitive Testing  Alert? Yes Normal Appearance?Yes  Oriented to person? Yes Place? Yes  Time? Yes  Displays appropriate judgment?Yes  Can read the correct time from a watch face? yes Are you having problems remembering things? Sometimes with names  Advanced Directives have been discussed with the patient?Yes, has advanced directives, full code    List the Names of Other Physician/Practitioners you currently use:    Indicate any recent Medical Services you may have received from other than [facility] providers in the past year (date may be approximate).   Assessment:    Annual Wellness Exam   Plan:    .  Medicare Attestation  I have personally reviewed:  The patient's medical and social history  Their use of alcohol, tobacco or illicit drugs  Their current medications and supplements  The patient's functional ability including ADLs,fall risks, home safety risks, cognitive, and hearing and visual impairment  Diet and physical activities  Evidence for depression or mood disorders  The patient's weight, height, BMI, and visual acuity have been recorded in the chart. I have made referrals, counseling, and provided education to the patient based on review of the above and I have provided the patient with a written personalized care plan for preventive services.      Review of Systems     Objective:   Physical Exam  BP 122/80 mmHg  Pulse 90  Resp 16  Ht 5\' 2"  (1.575 m)  Wt 185 lb (83.915 kg)  BMI 33.83 kg/m2  SpO2 98%      Assessment & Plan:  Medicare annual wellness visit, subsequent Annual exam as documented. Counseling done  re healthy lifestyle involving commitment to 150 minutes exercise per week, heart healthy diet, and attaining healthy weight.The importance of adequate sleep also discussed. Regular seat belt use and home safety, is also discussed. Changes in health habits are decided on  by the patient with goals and time frames  set for achieving them. Immunization and cancer screening needs are specifically addressed at this visit.

## 2016-02-21 NOTE — Patient Instructions (Signed)
Annual physical exam mid  November with [facility]SE, come for flu vaccine in September, and call if you need me before  PLANT BASED DIET is HEALTHIEST  Fasting labs 5 days before  follow up  Mammogram due in July 20 or after please schedule      Please work on good  health habits so that your health will improve. 1. Commitment to daily physical activity for 30 to 60  minutes, if you are able to do this.  2. Commitment to wise food choices. Aim for half of your  food intake to be vegetable and fruit, one quarter starchy foods, and one quarter protein. Try to eat on a regular schedule  3 meals per day, snacking between meals should be limited to vegetables or fruits or small portions of nuts. 64 ounces of water per day is generally recommended, unless you have specific health conditions, like heart failure or kidney failure where you will need to limit fluid intake.  3. Commitment to sufficient and a  good quality of physical and mental rest daily, generally between 6 to 8 hours per day.  WITH PERSISTANCE AND PERSEVERANCE, THE IMPOSSIBLE , BECOMES THE NORM!  Thank you  for choosing [facility]. We consider it a privelige to serve you.  Delivering excellent health care in a caring and  compassionate way is our goal.  Partnering with you,  so that together we can achieve this goal is our strategy.

## 2016-02-23 ENCOUNTER — Other Ambulatory Visit: Payer: Self-pay | Admitting: Family Medicine

## 2016-02-23 DIAGNOSIS — Z1231 Encounter for screening mammogram for malignant neoplasm of breast: Secondary | ICD-10-CM

## 2016-03-28 ENCOUNTER — Ambulatory Visit (HOSPITAL_COMMUNITY)
Admission: RE | Admit: 2016-03-28 | Discharge: 2016-03-28 | Disposition: A | Discharge status: Home / Self Care | Payer: Medicare Other | Source: Ambulatory Visit | Attending: Family Medicine | Admitting: Family Medicine

## 2016-03-28 DIAGNOSIS — Z1231 Encounter for screening mammogram for malignant neoplasm of breast: Secondary | ICD-10-CM | POA: Insufficient documentation

## 2016-05-02 ENCOUNTER — Ambulatory Visit (HOSPITAL_COMMUNITY): Payer: Medicare Other | Admitting: Physical Therapy

## 2016-05-07 ENCOUNTER — Ambulatory Visit (HOSPITAL_COMMUNITY): Payer: Medicare Other | Attending: Student | Admitting: Physical Therapy

## 2016-05-07 DIAGNOSIS — M6281 Muscle weakness (generalized): Secondary | ICD-10-CM | POA: Diagnosis present

## 2016-05-07 DIAGNOSIS — R2689 Other abnormalities of gait and mobility: Secondary | ICD-10-CM | POA: Insufficient documentation

## 2016-05-07 DIAGNOSIS — M25572 Pain in left ankle and joints of left foot: Secondary | ICD-10-CM | POA: Diagnosis present

## 2016-05-07 NOTE — Patient Instructions (Addendum)
Toe Raise (Standing)    Rock back on heels. Repeat __10-15__ times per set. Do ___1_ sets per session. Do _2___ sessions per day. 2 http://orth.exer.us/42   Copyright  VHI. All rights reserved.  Heel Raise: Bilateral (Standing)    Rise on balls of feet. Repeat 10-15____ times per set. Do __1__ sets per session. Do __2_ sessions per day.  http://orth.exer.us/38   Copyright  VHI. All rights reserved.  Balance: Unilateral    Attempt to balance on left leg, eyes open. Hold _30-60___ seconds. Repeat __3__ times per set. Do _1___ sets per session. Do ___2_ sessions per day. Perform exercise with eyes closed.  http://orth.exer.us/28   Copyright  VHI. All rights reserved.  Toe Curl: Unilateral    With right foot resting on towel, slowly bunch up towel by curling toes. Repeat __3__ times per set. Do _1___ sets per session. Do __2__ sessions per day.  http://orth.exer.us/18   Copyright  VHI. All rights reserved.  Strengthening: Straight Leg Raise (Phase 1)    Tighten muscles on front of left thigh, then lift leg __15__ inches from surface, keeping knee locked.  Repeat __10__ times per set. Do _1___ sets per session. Do ___2_ sessions per day.  http://orth.exer.us/614   Copyright  VHI. All rights reserved.  Strengthening: Hip Abduction (Side-Lying)    Tighten muscles on front of left thigh, then lift leg _12___ inches from surface, keeping knee locked.  Repeat __10__ times per set. Do __1__ sets per session. Do __2__ sessions per day.  http://orth.exer.us/622   Copyright  VHI. All rights reserved.  Strengthening: Hip Extension (Prone)    Tighten muscles on front of left thigh, then lift leg __2__ inches from surface, keeping knee locked. Repeat __10-15__ times per set. Do ___1_ sets per session. Do ___2_ sessions per day.  http://orth.exer.us/620   Copyright  VHI. All rights reserved.

## 2016-05-07 NOTE — Therapy (Signed)
[place], [place], [address] Phone: [phone]   Fax:  [phone]  Physical Therapy Evaluation  Patient Details  Name: Yvonne Brewer MRN: [id number] Date of Birth: Jul 17, 1946 Referring Provider: Mechele Claude   Encounter Date: 05/07/2016      PT End of Session - 05/07/16 1043    Visit Number 1   Number of Visits 8   Date for PT Re-Evaluation 06/06/16   Authorization Type [employer] medicare   Authorization - Visit Number 1   Authorization - Number of Visits 8   PT Start Time 0900   PT Stop Time 0945   PT Time Calculation (min) 45 min   Activity Tolerance Patient tolerated treatment well   Behavior During Therapy [facility] for tasks assessed/performed      Past Medical History:  Diagnosis Date  . Cancer ([place])    skin cancer  . Cystitis, unspecified   . Female bladder prolapse   . Hyperlipidemia    diet managed , but uncontrolled  . Hypertension 2015   staring medication 02/2014  . Obesity     Past Surgical History:  Procedure Laterality Date  . CATARACT EXTRACTION  2012   bilateral   . COLONOSCOPY  11/06/2011   Procedure: COLONOSCOPY;  Surgeon: Rogene Houston, MD;  [facility];  Service: Endoscopy;  Laterality: N/A;  7:30  . EYE SURGERY  2011   bilateral cataract with implants  . HAMMER TOE SURGERY Left 01/2016  . left bunionectomy  2002    . squamous cell ca rt ant chest 1989    . YAG LASER APPLICATION Left [id number]   Procedure: YAG LASER APPLICATION;  Surgeon: Rutherford Guys, MD;  Location: [facility];  Service: Ophthalmology;  Laterality: Left;  . YAG LASER APPLICATION Right [id number]   Procedure: YAG LASER APPLICATION;  Surgeon: Rutherford Guys, MD;  Location: [facility];  Service: Ophthalmology;  Laterality: Right;    There were no vitals filed for this visit.       Subjective Assessment - 05/07/16 0909    Subjective Yvonne Brewer has hammer toe surgery on June 12th on her left foot.  She  states the Dr. has basically released her but her foot is not normal.  She is having swelling; she has tried compression hose and states that she can not wear them.  She states that she still limps and she still is not able to walk a very long time therefore she asked if therapy will help.  The MD has therefore ordered physical therapy.    Pertinent History HTN undercontrol    How long can you sit comfortably? no problem    How long can you stand comfortably? increased pain after approximately 15 minutes    How long can you walk comfortably? able to walk for 30 minutes at the max. Walks slow enough that walking around the block takes 30 minutes    Patient Stated Goals Thanksgiving pt wants to take a trip [place].  Wants to be able to walk; to have less swelling.     Currently in Pain? Yes   Pain Score 2   Max goes 5/10   Pain Location Foot   Pain Orientation Left   Pain Descriptors / Indicators Tender   Pain Type Chronic pain   Pain Onset Other (comment)   Aggravating Factors  walking, standing    Pain Relieving Factors elevation  [facility] PT Assessment - 05/07/16 0001      Assessment   Medical Diagnosis S/P Lt  hammer toe surgery with difficulty walking    Referring Provider Mechele Claude    Onset Date/Surgical Date 02/05/16   Next MD Visit none   Prior Therapy none     Precautions   Precautions None     Restrictions   Weight Bearing Restrictions No     Balance Screen   Has the patient fallen in the past 6 months No   Has the patient had a decrease in activity level because of a fear of falling?  Yes   Is the patient reluctant to leave their home because of a fear of falling?  No     Home Ecologist residence     Prior Function   Level of [employer] Retired   Leisure yard work, Administrator, Civil Service, walking,      Charity fundraiser Status Within [employer] for tasks assessed      Observation/Other Assessments-Edema    Edema Circumferential  MTP LT 22.8; RT 22.0     Functional Tests   Functional tests Single leg stance     Single Leg Stance   Comments barefoot:  RT: 36  Lt: 20     ROM / Strength   AROM / PROM / Strength AROM;Strength     AROM   AROM Assessment Site Ankle   Right/Left Ankle Left   Left Ankle Dorsiflexion 4   Left Ankle Plantar Flexion 65   Left Ankle Inversion 40   Left Ankle Eversion 30     Strength   Strength Assessment Site Hip;Knee;Ankle   Right/Left Hip Right;Left   Right Hip Extension 3/5   Right Hip ABduction 5/5   Left Hip Flexion 3+/5   Left Hip Extension 3/5   Left Hip ABduction 4/5   Right/Left Knee Left   Left Knee Flexion 4/5   Left Knee Extension 5/5   Right/Left Ankle Right;Left   Right Ankle Plantar Flexion 3-/5   Right Ankle Eversion 5/5   Left Ankle Dorsiflexion 3+/5   Left Ankle Plantar Flexion 2+/5   Left Ankle Eversion 5/5                   [facility] Adult PT Treatment/Exercise - 05/07/16 0001      Exercises   Exercises Knee/Hip;Ankle     Knee/Hip Exercises: Supine   Straight Leg Raises Strengthening;Left;10 reps     Knee/Hip Exercises: Sidelying   Hip ABduction Strengthening;Left;10 reps     Knee/Hip Exercises: Prone   Hip Extension Strengthening;Both;10 reps     Ankle Exercises: Standing   Heel Raises 10 reps   Toe Raise 10 reps     Ankle Exercises: Seated   Towel Crunch 2 reps                PT Education - 05/07/16 0951    Education provided Yes   Education Details HEP   Person(s) Educated Patient   Methods Explanation   Comprehension Verbalized understanding          PT Short Term Goals - 05/07/16 1057      PT SHORT TERM GOAL #1   Title Pt pain in left foot to be no greater than a 2 to allow pt to walk for an hour at a time.    Time 2   Period Weeks   Status New  PT SHORT TERM GOAL #2   Title Pt single leg stance on both legs to be at least 30 seconds  to allow pt to feel comfortable walking on uneven terrain in her yard.    Time 4   Period Weeks   Status New     PT SHORT TERM GOAL #3   Title Pt strength of bilateral hip extension and gastroc musculature to be increased  by 1/2 grade to be comfortable walking up slight inclines for ten  minutes straight   Time 2   Period Weeks           PT Long Term Goals - 05/07/16 1052      PT LONG TERM GOAL #1   Title Pain in Left foot to be at a 0 to allow pt to walk in comfort for up to two hours at a time to go on ship excursions.    Time 4   Period Weeks   Status New     PT LONG TERM GOAL #2   Title Pt strength of bilateral LE to be at 4+/5 to allow pt to step up a 12 inch step up with hand support to transverse the [place]    Time 4   Period Weeks   Status New     PT LONG TERM GOAL #3   Title Pt to single leg stance for 60 seconds to feel secure walking over wet rocks on tours.    Time 4   Period Weeks   Status New     PT LONG TERM GOAL #4   Title Pt to be ambulating with a normal heel toe gait to prevent stress on knees, hips and back    Time 4   Period Weeks   Status New               Plan - 05/07/16 1044    Clinical Impression Statement Yvonne Brewer is an active 70 yo female who has had a hammer toe on her Lt foot for years.  She opted to have it corrected in June.  She has been released from the surgeon but she is not back functionally to where she wants to be therefore she has been referred to skilled physical therapy.  Evaluation demonstrates gait deviation, decreased strength, decreased balance and decrease ROM.  Yvonne Brewer will benefit from skilled physical therapy to address these issues and maximize her physical functioning.    Rehab Potential Good   PT Frequency 2x / week   PT Duration 4 weeks   PT Treatment/Interventions ADLs/Self Care Home Management;Cryotherapy;Gait training;Stair training;Functional mobility training;Therapeutic  activities;Therapeutic exercise;Balance training;Neuromuscular re-education;Patient/family education;Manual techniques   PT Next Visit Plan Begin rocker board, gait training,  gastroc stretching; sit to stand, lunging, and manual techiniques to Lt forefoot to decrease swelling    PT Home Exercise Plan Hip 4 SLR, toe crunch, heel raise, toe raise and Single leg stance.    Consulted and Agree with Plan of Care Patient      Patient will benefit from skilled therapeutic intervention in order to improve the following deficits and impairments:  Abnormal gait, Decreased activity tolerance, Decreased balance, Decreased range of motion, Decreased strength, Difficulty walking, Increased edema, Pain  Visit Diagnosis: Other abnormalities of gait and mobility - Plan: PT plan of care cert/re-cert  Pain in left ankle and joints of left foot - Plan: PT plan of care cert/re-cert  Muscle weakness (generalized) - Plan: PT plan of care cert/re-cert  G-Codes - 05/07/16 1058    Functional Assessment Tool Used clinical  and subjective judgement:  Gait deviation, pt subjective complaint of walking slow and having to stop at 30 minutes    Functional Limitation Mobility: Walking and moving around   Mobility: Walking and Moving Around Current Status [phone]) At least 40 percent but less than 60 percent impaired, limited or restricted   Mobility: Walking and Moving Around Goal Status [phone]) At least 1 percent but less than 20 percent impaired, limited or restricted       Problem List Patient Active Problem List   Diagnosis Date Noted  . Medicare annual wellness visit, subsequent 02/21/2016  . Chronic radicular pain of lower back 03/13/2014  . Abnormal mammogram of right breast 03/13/2014  . Reduced vision 03/13/2014  . HTN, goal below 140/90 03/08/2014  . Obesity (BMI 30.0-34.9) 02/19/2010  . Vitamin D deficiency 07/27/2009  . Hyperlipidemia LDL goal <100 02/19/2008  . BLADDER PROLAPSE 02/19/2008    Rayetta Humphrey, PT CLT [phone] 05/07/2016, 11:02 AM  [place], [place], [address] Phone: [phone]   Fax:  [phone]  Name: Yvonne Brewer MRN: [id number] Date of Birth: 1946-07-25

## 2016-05-09 ENCOUNTER — Ambulatory Visit (HOSPITAL_COMMUNITY): Payer: Medicare Other | Admitting: Physical Therapy

## 2016-05-09 DIAGNOSIS — M25572 Pain in left ankle and joints of left foot: Secondary | ICD-10-CM

## 2016-05-09 DIAGNOSIS — M6281 Muscle weakness (generalized): Secondary | ICD-10-CM

## 2016-05-09 DIAGNOSIS — R2689 Other abnormalities of gait and mobility: Secondary | ICD-10-CM | POA: Diagnosis not present

## 2016-05-09 NOTE — Therapy (Signed)
[place], [place], [address] Phone: [phone]   Fax:  [phone]  Physical Therapy Treatment  Patient Details  Name: Yvonne Brewer MRN: [id number] Date of Birth: July 18, 1946 Referring Provider: Mechele Claude   Encounter Date: 05/09/2016      PT End of Session - 05/09/16 1003    Visit Number 2   Number of Visits 8   Date for PT Re-Evaluation 06/06/16   Authorization Type [employer] medicare   Authorization - Visit Number 2   Authorization - Number of Visits 8   PT Start Time 0904   PT Stop Time 0950   PT Time Calculation (min) 46 min   Activity Tolerance Patient tolerated treatment well   Behavior During Therapy [facility] for tasks assessed/performed      Past Medical History:  Diagnosis Date  . Cancer ([place])    skin cancer  . Cystitis, unspecified   . Female bladder prolapse   . Hyperlipidemia    diet managed , but uncontrolled  . Hypertension 2015   staring medication 02/2014  . Obesity     Past Surgical History:  Procedure Laterality Date  . CATARACT EXTRACTION  2012   bilateral   . COLONOSCOPY  11/06/2011   Procedure: COLONOSCOPY;  Surgeon: Rogene Houston, MD;  [facility];  Service: Endoscopy;  Laterality: N/A;  7:30  . EYE SURGERY  2011   bilateral cataract with implants  . HAMMER TOE SURGERY Left 01/2016  . left bunionectomy  2002    . squamous cell ca rt ant chest 1989    . YAG LASER APPLICATION Left [id number]   Procedure: YAG LASER APPLICATION;  Surgeon: Rutherford Guys, MD;  Location: [facility] ORS;  Service: Ophthalmology;  Laterality: Left;  . YAG LASER APPLICATION Right [id number]   Procedure: YAG LASER APPLICATION;  Surgeon: Rutherford Guys, MD;  Location: [facility] ORS;  Service: Ophthalmology;  Laterality: Right;    There were no vitals filed for this visit.      Subjective Assessment - 05/09/16 1010    Subjective Pt states she's been completing her HEP and not able to balance on her LE  like she did here.  STates she is not having any pain today.   Currently in Pain? No/denies                         [facility] Adult PT Treatment/Exercise - 05/09/16 0001      Knee/Hip Exercises: Stretches   Gastroc Stretch Both;3 reps;30 seconds   Gastroc Stretch Limitations slant board     Knee/Hip Exercises: Standing   Heel Raises Both;10 reps   Heel Raises Limitations toeraises 10 reps   Forward Lunges Both;10 reps   Forward Lunges Limitations 4" no UE assist   Rocker Board Other (comment)  10 reps no HHA R/L and A/P   SLS bilaterally Rt:36", Lt: 18"   SLS with Vectors bilaterally with 1 HHA 5X5"     Manual Therapy   Manual Therapy Edema management;Soft tissue mobilization   Manual therapy comments to Lt foot completed seperate from all other skilled interventions   Edema Management to decrease swelling in Lt foot   Soft tissue mobilization to decrease scar tissue and adhesions                 PT Education - 05/09/16 0957    Education provided Yes   Education Details given copy of initial evaluation  and reviewed goals.  Completed HEP to ensure correct form.    Person(s) Educated Patient   Methods Explanation;Demonstration;Tactile cues;Verbal cues;Handout   Comprehension Verbalized understanding;Returned demonstration;Verbal cues required;Tactile cues required;Need further instruction          PT Short Term Goals - 05/07/16 1057      PT SHORT TERM GOAL #1   Title Pt pain in left foot to be no greater than a 2 to allow pt to walk for an hour at a time.    Time 2   Period Weeks   Status New     PT SHORT TERM GOAL #2   Title Pt single leg stance on both legs to be at least 30 seconds to allow pt to feel comfortable walking on uneven terrain in her yard.    Time 4   Period Weeks   Status New     PT SHORT TERM GOAL #3   Title Pt strength of bilateral hip extension and gastroc musculature to be increased  by 1/2 grade to be comfortable walking up  slight inclines for ten  minutes straight   Time 2   Period Weeks           PT Long Term Goals - 05/07/16 1052      PT LONG TERM GOAL #1   Title Pain in Left foot to be at a 0 to allow pt to walk in comfort for up to two hours at a time to go on ship excursions.    Time 4   Period Weeks   Status New     PT LONG TERM GOAL #2   Title Pt strength of bilateral LE to be at 4+/5 to allow pt to step up a 12 inch step up with hand support to transverse the [place]    Time 4   Period Weeks   Status New     PT LONG TERM GOAL #3   Title Pt to single leg stance for 60 seconds to feel secure walking over wet rocks on tours.    Time 4   Period Weeks   Status New     PT LONG TERM GOAL #4   Title Pt to be ambulating with a normal heel toe gait to prevent stress on knees, hips and back    Time 4   Period Weeks   Status New               Plan - 05/09/16 1003    Clinical Impression Statement Reviewed HEP (except for mat exercises) and goals for therapy.  given copy of initial evaluation.  pt able to complete established standing HEP in correct form.  Most diffiuculty with SLS and unable to maintain greater than 18" on Lt LE.  Added vector stance exercise to help increase LE stability.  Other added exercises include rockerboard and forward lunges.  Manual completed with noted tightness around scars and some edema in midfoot noted.  Pt with tender area proximal to 1st MTP that feels like may be screw backing out.  Unable to review xray to inspect if screws were used and recommended patient call MD office regarding this.    Rehab Potential Good   PT Frequency 2x / week   PT Duration 4 weeks   PT Treatment/Interventions ADLs/Self Care Home Management;Cryotherapy;Gait training;Stair training;Functional mobility training;Therapeutic activities;Therapeutic exercise;Balance training;Neuromuscular re-education;Patient/family education;Manual techniques   PT Next Visit Plan continue to  progress LE strength and stability.  Add sit to stand  and begin dynamic balance actvities when appropriate.     PT Home Exercise Plan 05/07/16:  Hip 4 SLR, toe crunch, heel raise, toe raise and Single leg stance.    Consulted and Agree with Plan of Care Patient      Patient will benefit from skilled therapeutic intervention in order to improve the following deficits and impairments:  Abnormal gait, Decreased activity tolerance, Decreased balance, Decreased range of motion, Decreased strength, Difficulty walking, Increased edema, Pain  Visit Diagnosis: Other abnormalities of gait and mobility  Pain in left ankle and joints of left foot  Muscle weakness (generalized)     Problem List Patient Active Problem List   Diagnosis Date Noted  . Medicare annual wellness visit, subsequent 02/21/2016  . Chronic radicular pain of lower back 03/13/2014  . Abnormal mammogram of right breast 03/13/2014  . Reduced vision 03/13/2014  . HTN, goal below 140/90 03/08/2014  . Obesity (BMI 30.0-34.9) 02/19/2010  . Vitamin D deficiency 07/27/2009  . Hyperlipidemia LDL goal <100 02/19/2008  . BLADDER PROLAPSE 02/19/2008    Teena Irani, PTA/CLT [phone]  05/09/2016, 10:11 AM  [facility] [address] [place], [place], [address] Phone: [phone]   Fax:  [phone]  Name: Yvonne Brewer MRN: [id number] Date of Birth: 05-Dec-1945

## 2016-05-14 ENCOUNTER — Ambulatory Visit (HOSPITAL_COMMUNITY): Payer: Medicare Other | Admitting: Physical Therapy

## 2016-05-14 DIAGNOSIS — M25572 Pain in left ankle and joints of left foot: Secondary | ICD-10-CM

## 2016-05-14 DIAGNOSIS — R2689 Other abnormalities of gait and mobility: Secondary | ICD-10-CM

## 2016-05-14 DIAGNOSIS — M6281 Muscle weakness (generalized): Secondary | ICD-10-CM

## 2016-05-14 NOTE — Therapy (Signed)
[place], [place], [address] Phone: [phone]   Fax:  [phone]  Physical Therapy Treatment  Patient Details  Name: Yvonne Brewer MRN: [id number] Date of Birth: 1945/10/22 Referring Provider: Mechele Claude   Encounter Date: 05/14/2016      PT End of Session - 05/14/16 0838    Visit Number 3   Number of Visits 8   Date for PT Re-Evaluation 06/06/16   Authorization Type [place] - Visit Number 3   Authorization - Number of Visits 8   Activity Tolerance Patient tolerated treatment well   Behavior During Therapy [facility] for tasks assessed/performed      Past Medical History:  Diagnosis Date  . Cancer ([place])    skin cancer  . Cystitis, unspecified   . Female bladder prolapse   . Hyperlipidemia    diet managed , but uncontrolled  . Hypertension 2015   staring medication 02/2014  . Obesity     Past Surgical History:  Procedure Laterality Date  . CATARACT EXTRACTION  2012   bilateral   . COLONOSCOPY  11/06/2011   Procedure: COLONOSCOPY;  Surgeon: Rogene Houston, MD;  [facility];  Service: Endoscopy;  Laterality: N/A;  7:30  . EYE SURGERY  2011   bilateral cataract with implants  . HAMMER TOE SURGERY Left 01/2016  . left bunionectomy  2002    . squamous cell ca rt ant chest 1989    . YAG LASER APPLICATION Left [id number]   Procedure: YAG LASER APPLICATION;  Surgeon: Rutherford Guys, MD;  Location: [facility];  Service: Ophthalmology;  Laterality: Left;  . YAG LASER APPLICATION Right [id number]   Procedure: YAG LASER APPLICATION;  Surgeon: Rutherford Guys, MD;  Location: [facility];  Service: Ophthalmology;  Laterality: Right;    There were no vitals filed for this visit.      Subjective Assessment - 05/14/16 0821    Subjective Pt states that her foot has been more tender than normal.  She has been doing her exercises.  She has done a lot of walkng this week.    Currently in Pain?  No/denies                         [facility] Adult PT Treatment/Exercise - 05/14/16 0001      Knee/Hip Exercises: Stretches   Gastroc Stretch Both;3 reps;30 seconds   Gastroc Stretch Limitations slant board     Knee/Hip Exercises: Standing   Heel Raises Both;15 reps   Heel Raises Limitations toeraises 15 reps   Forward Lunges Both;10 reps   Forward Step Up Left;10 reps;Step Height: 4"   Rocker Board Other (comment)  10 reps no HHA R/L and A/P   SLS with Vectors bilaterally with 1 HHA 5X5"   Other Standing Knee Exercises Baps Level 2 x 10 reps    Other Standing Knee Exercises tandem stance x 30 seconds bilaterally     Knee/Hip Exercises: Seated   Sit to Sand 20 reps  10 with rt in back 10 with Lt in back      Manual Therapy   Manual Therapy Edema management;Soft tissue mobilization   Manual therapy comments to Lt foot completed seperate from all other skilled interventions   Edema Management to decrease swelling in Lt foot   Soft tissue mobilization to decrease scar tissue and adhesions  PT Short Term Goals - 05/14/16 0907      PT SHORT TERM GOAL #1   Title Pt pain in left foot to be no greater than a 2 to allow pt to walk for an hour at a time.    Time 2   Period Weeks   Status On-going     PT SHORT TERM GOAL #2   Title Pt single leg stance on both legs to be at least 30 seconds to allow pt to feel comfortable walking on uneven terrain in her yard.    Time 4   Period Weeks   Status On-going     PT SHORT TERM GOAL #3   Title Pt strength of bilateral hip extension and gastroc musculature to be increased  by 1/2 grade to be comfortable walking up slight inclines for ten  minutes straight   Time 2   Period Weeks   Status On-going           PT Long Term Goals - 05/14/16 0907      PT LONG TERM GOAL #1   Title Pain in Left foot to be at a 0 to allow pt to walk in comfort for up to two hours at a time to go on ship excursions.     Time 4   Period Weeks   Status On-going     PT LONG TERM GOAL #2   Title Pt strength of bilateral LE to be at 4+/5 to allow pt to step up a 12 inch step up with hand support to transverse the [place]    Time 4   Period Weeks   Status On-going     PT LONG TERM GOAL #3   Title Pt to single leg stance for 60 seconds to feel secure walking over wet rocks on tours.    Time 4   Period Weeks   Status On-going     PT LONG TERM GOAL #4   Title Pt to be ambulating with a normal heel toe gait to prevent stress on knees, hips and back    Time 4   Period Weeks   Status On-going               Plan - 05/14/16 [id number]    Clinical Impression Statement Added Baps board and sit to stand to program to improve LE strength and stability.  Pt continues to have edema in Lt LE which responds well to manual.  Manual completed seperately and at end of session.     Rehab Potential Good   PT Frequency 2x / week   PT Duration 4 weeks   PT Treatment/Interventions ADLs/Self Care Home Management;Cryotherapy;Gait training;Stair training;Functional mobility training;Therapeutic activities;Therapeutic exercise;Balance training;Neuromuscular re-education;Patient/family education;Manual techniques   PT Next Visit Plan continue to progress LE strength and stability.  Add sit to stand and begin dynamic balance actvities when appropriate.     PT Home Exercise Plan 05/07/16:  Hip 4 SLR, toe crunch, heel raise, toe raise and Single leg stance.    Consulted and Agree with Plan of Care Patient      Patient will benefit from skilled therapeutic intervention in order to improve the following deficits and impairments:  Abnormal gait, Decreased activity tolerance, Decreased balance, Decreased range of motion, Decreased strength, Difficulty walking, Increased edema, Pain  Visit Diagnosis: Other abnormalities of gait and mobility  Pain in left ankle and joints of left foot  Muscle weakness  (generalized)     Problem List Patient  Active Problem List   Diagnosis Date Noted  . Medicare annual wellness visit, subsequent 02/21/2016  . Chronic radicular pain of lower back 03/13/2014  . Abnormal mammogram of right breast 03/13/2014  . Reduced vision 03/13/2014  . HTN, goal below 140/90 03/08/2014  . Obesity (BMI 30.0-34.9) 02/19/2010  . Vitamin D deficiency 07/27/2009  . Hyperlipidemia LDL goal <100 02/19/2008  . BLADDER PROLAPSE 02/19/2008    Rayetta Humphrey, PT CLT [phone] 05/14/2016, 9:08 AM  [facility] [address] [place], [place], [address] Phone: [phone]   Fax:  [phone]  Name: Yvonne Brewer MRN: [id number] Date of Birth: [birth year]

## 2016-05-16 ENCOUNTER — Ambulatory Visit (HOSPITAL_COMMUNITY): Payer: Medicare Other | Admitting: Physical Therapy

## 2016-05-16 DIAGNOSIS — M6281 Muscle weakness (generalized): Secondary | ICD-10-CM

## 2016-05-16 DIAGNOSIS — R2689 Other abnormalities of gait and mobility: Secondary | ICD-10-CM

## 2016-05-16 DIAGNOSIS — M25572 Pain in left ankle and joints of left foot: Secondary | ICD-10-CM

## 2016-05-16 NOTE — Therapy (Signed)
[facility] [address] [place], [place], [address] Phone: [phone]   Fax:  [phone]  Physical Therapy Treatment  Patient Details  Name: Yvonne Brewer MRN: [id number] Date of Birth: [birth year] Referring Provider: Mechele Claude   Encounter Date: 05/16/2016      PT End of Session - 05/16/16 1030    Visit Number 4   Number of Visits 8   Date for PT Re-Evaluation 06/06/16   Authorization Type [employer] medicare   Authorization - Visit Number 4   Authorization - Number of Visits 8   PT Start Time 0909   PT Stop Time 0950   PT Time Calculation (min) 41 min   Activity Tolerance Patient tolerated treatment well   Behavior During Therapy [facility] for tasks assessed/performed      Past Medical History:  Diagnosis Date  . Cancer ([place])    skin cancer  . Cystitis, unspecified   . Female bladder prolapse   . Hyperlipidemia    diet managed , but uncontrolled  . Hypertension 2015   staring medication 02/2014  . Obesity     Past Surgical History:  Procedure Laterality Date  . CATARACT EXTRACTION  2012   bilateral   . COLONOSCOPY  11/06/2011   Procedure: COLONOSCOPY;  Surgeon: Rogene Houston, MD;  [facility];  Service: Endoscopy;  Laterality: N/A;  7:30  . EYE SURGERY  2011   bilateral cataract with implants  . HAMMER TOE SURGERY Left 01/2016  . left bunionectomy  2002    . squamous cell ca rt ant chest 1989    . YAG LASER APPLICATION Left [id number]   Procedure: YAG LASER APPLICATION;  Surgeon: Rutherford Guys, MD;  Location: [facility];  Service: Ophthalmology;  Laterality: Left;  . YAG LASER APPLICATION Right [id number]   Procedure: YAG LASER APPLICATION;  Surgeon: Rutherford Guys, MD;  Location: [facility]S;  Service: Ophthalmology;  Laterality: Right;    There were no vitals filed for this visit.      Subjective Assessment - 05/16/16 1032    Subjective Pt states her foot is doing well.  Most tenderness along medial aspect and  dorsal aspect of great toe..  Reports she is experimenting with her shoes to see which are more comfortable.                          [facility] Adult PT Treatment/Exercise - 05/16/16 0001      Knee/Hip Exercises: Stretches   Gastroc Stretch Both;3 reps;30 seconds   Gastroc Stretch Limitations slant board     Knee/Hip Exercises: Standing   Heel Raises Both;15 reps   Heel Raises Limitations toeraises 15 reps   Forward Lunges Both;10 reps   Forward Lunges Limitations 4" no UE assist   Lateral Step Up Left;10 reps;Step Height: 6"   Forward Step Up Left;10 reps;Step Height: 6"   SLS bilaterally Rt:18", Lt: 10"   SLS with Vectors bilaterally with 1 HHA 5X5"   Other Standing Knee Exercises Baps Level 2 x 10 reps    Other Standing Knee Exercises tandem stance x 30 seconds bilaterally     Manual Therapy   Manual Therapy Joint mobilization;Soft tissue mobilization   Manual therapy comments to Lt foot completed seperate from all other skilled interventions   Joint Mobilization to improve MTP and forefoot mobilization   Soft tissue mobilization to decrease scar tissue and adhesions  PT Short Term Goals - 05/14/16 0907      PT SHORT TERM GOAL #1   Title Pt pain in left foot to be no greater than a 2 to allow pt to walk for an hour at a time.    Time 2   Period Weeks   Status On-going     PT SHORT TERM GOAL #2   Title Pt single leg stance on both legs to be at least 30 seconds to allow pt to feel comfortable walking on uneven terrain in her yard.    Time 4   Period Weeks   Status On-going     PT SHORT TERM GOAL #3   Title Pt strength of bilateral hip extension and gastroc musculature to be increased  by 1/2 grade to be comfortable walking up slight inclines for ten  minutes straight   Time 2   Period Weeks   Status On-going           PT Long Term Goals - 05/14/16 0907      PT LONG TERM GOAL #1   Title Pain in Left foot to be at a 0 to  allow pt to walk in comfort for up to two hours at a time to go on ship excursions.    Time 4   Period Weeks   Status On-going     PT LONG TERM GOAL #2   Title Pt strength of bilateral LE to be at 4+/5 to allow pt to step up a 12 inch step up with hand support to transverse the [place]    Time 4   Period Weeks   Status On-going     PT LONG TERM GOAL #3   Title Pt to single leg stance for 60 seconds to feel secure walking over wet rocks on tours.    Time 4   Period Weeks   Status On-going     PT LONG TERM GOAL #4   Title Pt to be ambulating with a normal heel toe gait to prevent stress on knees, hips and back    Time 4   Period Weeks   Status On-going               Plan - 05/16/16 0950    Clinical Impression Statement Continued with focus on LE strength and stability.  Able to increase to 6" step and added lateral step ups to POC without difficulty.  Pt exhibits good eccentric control with descent.  Very little edema palpable with manual this session.  Worked more on mobility.  Pt is ready for dynamic balance actvities next session.    Rehab Potential Good   PT Frequency 2x / week   PT Duration 4 weeks   PT Treatment/Interventions ADLs/Self Care Home Management;Cryotherapy;Gait training;Stair training;Functional mobility training;Therapeutic activities;Therapeutic exercise;Balance training;Neuromuscular re-education;Patient/family education;Manual techniques   PT Next Visit Plan continue to progress LE strength and stability.  Add dynamic balance actvities next session.   PT Home Exercise Plan 05/07/16:  Hip 4 SLR, toe crunch, heel raise, toe raise and Single leg stance.    Consulted and Agree with Plan of Care Patient      Patient will benefit from skilled therapeutic intervention in order to improve the following deficits and impairments:  Abnormal gait, Decreased activity tolerance, Decreased balance, Decreased range of motion, Decreased strength, Difficulty  walking, Increased edema, Pain  Visit Diagnosis: Other abnormalities of gait and mobility  Pain in left ankle and joints of left foot  Muscle weakness (generalized)     Problem List Patient Active Problem List   Diagnosis Date Noted  . Medicare annual wellness visit, subsequent 02/21/2016  . Chronic radicular pain of lower back 03/13/2014  . Abnormal mammogram of right breast 03/13/2014  . Reduced vision 03/13/2014  . HTN, goal below 140/90 03/08/2014  . Obesity (BMI 30.0-34.9) 02/19/2010  . Vitamin D deficiency 07/27/2009  . Hyperlipidemia LDL goal <100 02/19/2008  . BLADDER PROLAPSE 02/19/2008    Teena Irani, PTA/CLT [phone]  05/16/2016, 10:34 AM  [place], [place], [address] Phone: [phone]   Fax:  [phone]  Name: Yvonne Brewer MRN: [id number] Date of Birth: 13-Sep-1945

## 2016-05-21 ENCOUNTER — Ambulatory Visit (HOSPITAL_COMMUNITY): Payer: Medicare Other | Admitting: Physical Therapy

## 2016-05-21 DIAGNOSIS — M25572 Pain in left ankle and joints of left foot: Secondary | ICD-10-CM

## 2016-05-21 DIAGNOSIS — R2689 Other abnormalities of gait and mobility: Secondary | ICD-10-CM

## 2016-05-21 DIAGNOSIS — M6281 Muscle weakness (generalized): Secondary | ICD-10-CM

## 2016-05-21 NOTE — Therapy (Signed)
[facility] [address] [place], [place], [address] Phone: [phone]   Fax:  [phone]  Physical Therapy Treatment  Patient Details  Name: Yvonne Brewer MRN: [id number] Date of Birth: [birth year] Referring Provider: Mechele Claude   Encounter Date: 05/21/2016      PT End of Session - 05/21/16 1137    Visit Number 5   Number of Visits 8   Date for PT Re-Evaluation 06/06/16   Authorization Type [employer] medicare   Authorization - Visit Number 5   Authorization - Number of Visits 8   PT Start Time [phone]   PT Stop Time 0944   PT Time Calculation (min) 52 min   Activity Tolerance Patient tolerated treatment well   Behavior During Therapy [facility] for tasks assessed/performed      Past Medical History:  Diagnosis Date  . Cancer ([place])    skin cancer  . Cystitis, unspecified   . Female bladder prolapse   . Hyperlipidemia    diet managed , but uncontrolled  . Hypertension 2015   staring medication 02/2014  . Obesity     Past Surgical History:  Procedure Laterality Date  . CATARACT EXTRACTION  2012   bilateral   . COLONOSCOPY  11/06/2011   Procedure: COLONOSCOPY;  Surgeon: Rogene Houston, MD;  Location: [facility];  Service: Endoscopy;  Laterality: N/A;  7:30  . EYE SURGERY  2011   bilateral cataract with implants  . HAMMER TOE SURGERY Left 01/2016  . left bunionectomy  2002    . squamous cell ca rt ant chest 1989    . YAG LASER APPLICATION Left [id number]   Procedure: YAG LASER APPLICATION;  Surgeon: Rutherford Guys, MD;  Location: [facility] ORS;  Service: Ophthalmology;  Laterality: Left;  . YAG LASER APPLICATION Right [id number]   Procedure: YAG LASER APPLICATION;  Surgeon: Rutherford Guys, MD;  Location: [facility] ORS;  Service: Ophthalmology;  Laterality: Right;    There were no vitals filed for this visit.      Subjective Assessment - 05/21/16 0856    Subjective Pt states she worked in the yard a long time yesterday and is shocked she is  not hurting today.  No real pain today, just states her shoes irritate the area.   Currently in Pain? No/denies                         [facility] Adult PT Treatment/Exercise - 05/21/16 0001      Knee/Hip Exercises: Stretches   Gastroc Stretch Both;3 reps;30 seconds   Gastroc Stretch Limitations slant board     Knee/Hip Exercises: Standing   Heel Raises Both;15 reps   Heel Raises Limitations toeraises 15 reps   Forward Lunges Both;15 reps   Forward Lunges Limitations 4" no UE assist   Lateral Step Up Left;10 reps;Step Height: 6"   Forward Step Up Left;10 reps;Step Height: 6"   SLS bilaterally Rt:22", Lt: 34"   SLS with Vectors bilaterally with 1 HHA 5X5"   Other Standing Knee Exercises Baps Level 3 x 10 reps A/P and Rt/Lt, level 2 CW/CCW 10 reps each    Other Standing Knee Exercises tandem gait and side stepping 1RT each     Manual Therapy   Manual Therapy Joint mobilization;Soft tissue mobilization   Manual therapy comments to Lt foot completed seperate from all other skilled interventions   Joint Mobilization to improve MTP and forefoot mobilization   Soft tissue mobilization  to decrease scar tissue and adhesions                   PT Short Term Goals - 05/14/16 0907      PT SHORT TERM GOAL #1   Title Pt pain in left foot to be no greater than a 2 to allow pt to walk for an hour at a time.    Time 2   Period Weeks   Status On-going     PT SHORT TERM GOAL #2   Title Pt single leg stance on both legs to be at least 30 seconds to allow pt to feel comfortable walking on uneven terrain in her yard.    Time 4   Period Weeks   Status On-going     PT SHORT TERM GOAL #3   Title Pt strength of bilateral hip extension and gastroc musculature to be increased  by 1/2 grade to be comfortable walking up slight inclines for ten  minutes straight   Time 2   Period Weeks   Status On-going           PT Long Term Goals - 05/14/16 0907      PT LONG TERM GOAL  #1   Title Pain in Left foot to be at a 0 to allow pt to walk in comfort for up to two hours at a time to go on ship excursions.    Time 4   Period Weeks   Status On-going     PT LONG TERM GOAL #2   Title Pt strength of bilateral LE to be at 4+/5 to allow pt to step up a 12 inch step up with hand support to transverse the [place]    Time 4   Period Weeks   Status On-going     PT LONG TERM GOAL #3   Title Pt to single leg stance for 60 seconds to feel secure walking over wet rocks on tours.    Time 4   Period Weeks   Status On-going     PT LONG TERM GOAL #4   Title Pt to be ambulating with a normal heel toe gait to prevent stress on knees, hips and back    Time 4   Period Weeks   Status On-going               Plan - 05/21/16 1139    Clinical Impression Statement Continued focus with LE strengthening with added stability.  Noted improvement in static stability to 34 seconds on the Left.  Able to increase to level 3 with A/P and Rt/Lt motions with BAPS but was too high to control for CW/CCW motions, decreasing back to level 2.  Began dynamic balance actvities with overall good control and abiltiy to self correct with LOB during tandem gait.  Good mobilty with manual techniques.     Rehab Potential Good   PT Frequency 2x / week   PT Duration 4 weeks   PT Treatment/Interventions ADLs/Self Care Home Management;Cryotherapy;Gait training;Stair training;Functional mobility training;Therapeutic activities;Therapeutic exercise;Balance training;Neuromuscular re-education;Patient/family education;Manual techniques   PT Next Visit Plan continue to progress LE strength and stability.  PRogress dynamic balance actvities next session.   PT Home Exercise Plan 05/07/16:  Hip 4 SLR, toe crunch, heel raise, toe raise and Single leg stance.    Consulted and Agree with Plan of Care Patient      Patient will benefit from skilled therapeutic intervention in order to improve the following  deficits and impairments:  Abnormal gait, Decreased activity tolerance, Decreased balance, Decreased range of motion, Decreased strength, Difficulty walking, Increased edema, Pain  Visit Diagnosis: Other abnormalities of gait and mobility  Pain in left ankle and joints of left foot  Muscle weakness (generalized)     Problem List Patient Active Problem List   Diagnosis Date Noted  . Medicare annual wellness visit, subsequent 02/21/2016  . Chronic radicular pain of lower back 03/13/2014  . Abnormal mammogram of right breast 03/13/2014  . Reduced vision 03/13/2014  . HTN, goal below 140/90 03/08/2014  . Obesity (BMI 30.0-34.9) 02/19/2010  . Vitamin D deficiency 07/27/2009  . Hyperlipidemia LDL goal <100 02/19/2008  . BLADDER PROLAPSE 02/19/2008    Teena Irani, PTA/CLT [phone]  05/21/2016, 11:49 AM  [facility] [address] [place], [place], [address] Phone: [phone]   Fax:  [phone]  Name: Yvonne Brewer MRN: [id number] Date of Birth: [birth year]

## 2016-05-23 ENCOUNTER — Ambulatory Visit (HOSPITAL_COMMUNITY): Payer: Medicare Other | Admitting: Physical Therapy

## 2016-05-23 DIAGNOSIS — M25572 Pain in left ankle and joints of left foot: Secondary | ICD-10-CM

## 2016-05-23 DIAGNOSIS — M6281 Muscle weakness (generalized): Secondary | ICD-10-CM

## 2016-05-23 DIAGNOSIS — R2689 Other abnormalities of gait and mobility: Secondary | ICD-10-CM

## 2016-05-23 NOTE — Therapy (Signed)
[facility] [address] [place], [place], [address] Phone: [phone]   Fax:  [phone]  Physical Therapy Treatment  Patient Details  Name: Yvonne Brewer MRN: [id number] Date of Birth: December 08, 1945 Referring Provider: Mechele Claude   Encounter Date: 05/23/2016      PT End of Session - 05/23/16 1442    Visit Number 6   Number of Visits 8   Date for PT Re-Evaluation 06/06/16   Authorization Type [employer] medicare   Authorization - Visit Number 6   Authorization - Number of Visits 8   PT Start Time [phone]   PT Stop Time 0901   PT Time Calculation (min) 44 min   Activity Tolerance Patient tolerated treatment well   Behavior During Therapy [facility] for tasks assessed/performed      Past Medical History:  Diagnosis Date  . Cancer ([place])    skin cancer  . Cystitis, unspecified   . Female bladder prolapse   . Hyperlipidemia    diet managed , but uncontrolled  . Hypertension 2015   staring medication 02/2014  . Obesity     Past Surgical History:  Procedure Laterality Date  . CATARACT EXTRACTION  2012   bilateral   . COLONOSCOPY  11/06/2011   Procedure: COLONOSCOPY;  Surgeon: Rogene Houston, MD;  [facility];  Service: Endoscopy;  Laterality: N/A;  7:30  . EYE SURGERY  2011   bilateral cataract with implants  . HAMMER TOE SURGERY Left 01/2016  . left bunionectomy  2002    . squamous cell ca rt ant chest 1989    . YAG LASER APPLICATION Left [id number]   Procedure: YAG LASER APPLICATION;  Surgeon: Rutherford Guys, MD;  Location: [facility];  Service: Ophthalmology;  Laterality: Left;  . YAG LASER APPLICATION Right [id number]   Procedure: YAG LASER APPLICATION;  Surgeon: Rutherford Guys, MD;  Location: [facility] ORS;  Service: Ophthalmology;  Laterality: Right;    There were no vitals filed for this visit.      Subjective Assessment - 05/23/16 0829    Subjective Pt states that she was in her yard for about three hours she is having back,  foot and ankle pain today.  Pt took Advil today.    Currently in Pain? Yes   Pain Score 4    Pain Location Foot   Pain Orientation Left   Pain Descriptors / Indicators Aching                         [facility] Adult PT Treatment/Exercise - 05/23/16 0001      Balance Poses: Yoga   Warrior I 2 reps;30 seconds     Knee/Hip Exercises: Standing   Heel Raises Both;10 reps  Done with wall arch to assist in back pain.    Heel Raises Limitations toeraises 15 reps   Stairs x 3 RT      Knee/Hip Exercises: Seated   Other Seated Knee/Hip Exercises Baps level 3 done sitting today due to increased pain when done standing; press feet into floor head up and scapular retraction for posture      Knee/Hip Exercises: Supine   Other Supine Knee/Hip Exercises toe curls; toe extensions and toe abuction x 5 each      Manual Therapy   Manual Therapy Joint mobilization;Soft tissue mobilization   Manual therapy comments to Lt foot completed seperate from all other skilled interventions   Joint Mobilization to improve IPand  MTP and forefoot mobilization   Soft tissue mobilization to decrease swelling noted distal lateral aspect of leg                   PT Short Term Goals - 05/14/16 0907      PT SHORT TERM GOAL #1   Title Pt pain in left foot to be no greater than a 2 to allow pt to walk for an hour at a time.    Time 2   Period Weeks   Status On-going     PT SHORT TERM GOAL #2   Title Pt single leg stance on both legs to be at least 30 seconds to allow pt to feel comfortable walking on uneven terrain in her yard.    Time 4   Period Weeks   Status On-going     PT SHORT TERM GOAL #3   Title Pt strength of bilateral hip extension and gastroc musculature to be increased  by 1/2 grade to be comfortable walking up slight inclines for ten  minutes straight   Time 2   Period Weeks   Status On-going           PT Long Term Goals - 05/14/16 0907      PT LONG TERM GOAL #1    Title Pain in Left foot to be at a 0 to allow pt to walk in comfort for up to two hours at a time to go on ship excursions.    Time 4   Period Weeks   Status On-going     PT LONG TERM GOAL #2   Title Pt strength of bilateral LE to be at 4+/5 to allow pt to step up a 12 inch step up with hand support to transverse the [place]    Time 4   Period Weeks   Status On-going     PT LONG TERM GOAL #3   Title Pt to single leg stance for 60 seconds to feel secure walking over wet rocks on tours.    Time 4   Period Weeks   Status On-going     PT LONG TERM GOAL #4   Title Pt to be ambulating with a normal heel toe gait to prevent stress on knees, hips and back    Time 4   Period Weeks   Status On-going               Plan - 05/23/16 1442    Clinical Impression Statement Pt needed to complete Baps sitting down due to increased ankle more than toe pain from increased activity.  Pt instructed and given sheet for warrior I pose.  Noted increased swelling along lateral aspect of leg with manual with retro massge techniques completed to decrease swelling.    Rehab Potential Good   PT Frequency 2x / week   PT Duration 4 weeks   PT Treatment/Interventions ADLs/Self Care Home Management;Cryotherapy;Gait training;Stair training;Functional mobility training;Therapeutic activities;Therapeutic exercise;Balance training;Neuromuscular re-education;Patient/family education;Manual techniques   PT Next Visit Plan Tree pose; return to standing for Baps at level 3.  Reasses in the next two visits.    PT Home Exercise Plan 05/07/16:  Hip 4 SLR, toe crunch, heel raise, toe raise and Single leg stance. warrior I    Consulted and Agree with Plan of Care Patient      Patient will benefit from skilled therapeutic intervention in order to improve the following deficits and impairments:  Abnormal gait, Decreased activity tolerance, Decreased  balance, Decreased range of motion, Decreased strength,  Difficulty walking, Increased edema, Pain  Visit Diagnosis: Pain in left ankle and joints of left foot  Muscle weakness (generalized)  Other abnormalities of gait and mobility     Problem List Patient Active Problem List   Diagnosis Date Noted  . Medicare annual wellness visit, subsequent 02/21/2016  . Chronic radicular pain of lower back 03/13/2014  . Abnormal mammogram of right breast 03/13/2014  . Reduced vision 03/13/2014  . HTN, goal below 140/90 03/08/2014  . Obesity (BMI 30.0-34.9) 02/19/2010  . Vitamin D deficiency 07/27/2009  . Hyperlipidemia LDL goal <100 02/19/2008  . BLADDER PROLAPSE 02/19/2008   Rayetta Humphrey, PT CLT [phone] 05/23/2016, 2:47 PM  [facility] [address] [place], [place], [address] Phone: [phone]   Fax:  [phone]  Name: Yvonne Brewer MRN: [id number] Date of Birth: 30-Sep-1945

## 2016-05-24 ENCOUNTER — Telehealth (HOSPITAL_COMMUNITY): Payer: Self-pay | Admitting: Physical Therapy

## 2016-05-24 NOTE — Telephone Encounter (Signed)
L/m to cx Cr covers Acute for Beth D.

## 2016-05-28 ENCOUNTER — Encounter (HOSPITAL_COMMUNITY): Payer: Medicare Other | Admitting: Physical Therapy

## 2016-05-30 ENCOUNTER — Encounter (HOSPITAL_COMMUNITY): Payer: Medicare Other | Admitting: Physical Therapy

## 2016-06-04 ENCOUNTER — Ambulatory Visit (HOSPITAL_COMMUNITY): Payer: Medicare Other | Attending: Student | Admitting: Physical Therapy

## 2016-06-04 DIAGNOSIS — M6281 Muscle weakness (generalized): Secondary | ICD-10-CM

## 2016-06-04 DIAGNOSIS — R2689 Other abnormalities of gait and mobility: Secondary | ICD-10-CM

## 2016-06-04 DIAGNOSIS — M25572 Pain in left ankle and joints of left foot: Secondary | ICD-10-CM

## 2016-06-04 NOTE — Therapy (Signed)
[facility] [address] [place], [place], [address] Phone: [phone]   Fax:  [phone]  Physical Therapy Treatment  Patient Details  Name: Yvonne Brewer MRN: [id number] Date of Birth: Jul 05, 1946 Referring Provider: Mechele Claude   Encounter Date: 06/04/2016      PT End of Session - 06/04/16 1405    Visit Number 7   Number of Visits 7   Date for PT Re-Evaluation 06/06/16   Authorization Type [employer] medicare   Authorization - Visit Number 7   Authorization - Number of Visits 7   PT Start Time 0906   PT Stop Time 0948   PT Time Calculation (min) 42 min   Activity Tolerance Patient tolerated treatment well   Behavior During Therapy [facility] for tasks assessed/performed      Past Medical History:  Diagnosis Date  . Cancer ([place])    skin cancer  . Cystitis, unspecified   . Female bladder prolapse   . Hyperlipidemia    diet managed , but uncontrolled  . Hypertension 2015   staring medication 02/2014  . Obesity     Past Surgical History:  Procedure Laterality Date  . CATARACT EXTRACTION  2012   bilateral   . COLONOSCOPY  11/06/2011   Procedure: COLONOSCOPY;  Surgeon: Rogene Houston, MD;  [facility];  Service: Endoscopy;  Laterality: N/A;  7:30  . EYE SURGERY  2011   bilateral cataract with implants  . HAMMER TOE SURGERY Left 01/2016  . left bunionectomy  2002    . squamous cell ca rt ant chest 1989    . YAG LASER APPLICATION Left 9/37/9024   Procedure: YAG LASER APPLICATION;  Surgeon: Rutherford Guys, MD;  Location: [facility];  Service: Ophthalmology;  Laterality: Left;  . YAG LASER APPLICATION Right 0/97/3532   Procedure: YAG LASER APPLICATION;  Surgeon: Rutherford Guys, MD;  Location: [facility];  Service: Ophthalmology;  Laterality: Right;    There were no vitals filed for this visit.      Subjective Assessment - 06/04/16 0904    Subjective Ms. Haddox states that the major thing that she can tell is that she is  conscious of how she is walking.   Pertinent History HTN undercontrol    How long can you sit comfortably? no problem    How long can you stand comfortably? feels that she can stand longer now but is unsure how long that she can stand.     How long can you walk comfortably? able to walk for 30 minutes with ease was  at the max. [place] faster now    Patient Stated Goals Thanksgiving pt wants to take a trip [place].  Wants to be able to walk; to have less swelling.     Currently in Pain? Yes   Pain Score 2    Pain Location Toe (Comment which one)   Pain Orientation Left   Pain Descriptors / Indicators Sore   Pain Onset Other (comment)            [facility] PT Assessment - 06/04/16 0001      Assessment   Medical Diagnosis S/P Lt  hammer toe surgery with difficulty walking    Referring Provider Mechele Claude    Onset Date/Surgical Date 02/05/16   Next MD Visit none   Prior Therapy none     Precautions   Precautions None     Restrictions   Weight Bearing Restrictions No  Balance Screen   Has the patient fallen in the past 6 months Yes   How many times? 1   Has the patient had a decrease in activity level because of a fear of falling?  No   Is the patient reluctant to leave their home because of a fear of falling?  No     Home Ecologist residence     Prior Function   Level of Independence Independent   Vocation Retired   Leisure yard work, Administrator, Civil [employer], walking,      Charity fundraiser Status Within [employer] for tasks assessed     Observation/Other Assessments-Edema    Edema Circumferential  MTP LT 22.8; RT 22.0 no change     Functional Tests   Functional tests Single leg stance     Single Leg Stance   Comments barefoot:  RT: 40  was 36  Lt:  60 was 20     AROM   Left Ankle Dorsiflexion 10  was 4    Left Ankle Plantar Flexion 65  was 65   Left Ankle Inversion 40  was 40   Left Ankle Eversion 30   was 30     Strength   Right Hip Extension 4+/5  was 4+/5   Right Hip ABduction 5/5   Left Hip Flexion 5/5  was 3+/5   Left Hip Extension 4+/5  was 3+/5    Left Hip ABduction 5/5  was 4/5    Left Knee Flexion 5/5  was 4/5   Left Knee Extension 5/5   Right Ankle Plantar Flexion 4+/5  ws 3-/5    Right Ankle Eversion 5/5   Left Ankle Dorsiflexion 5/5  was 3+/5   Left Ankle Plantar Flexion 3-/5  was 2+/5    Left Ankle Eversion 5/5                     [facility] Adult PT Treatment/Exercise - 06/04/16 0001      Knee/Hip Exercises: Standing   Heel Raises Both;10 reps   SLS B x 3    Other Standing Knee Exercises step up onto 12" box  with Rt and LT      Knee/Hip Exercises: Seated   Sit to Sand 10 reps                PT Education - 06/04/16 1404    Education provided Yes   Education Details The importance of keeping up balance activity, the importance of walking for exercise, using a walking pole during cruise, ice massage to get swelling down .   Person(s) Educated Patient   Methods Explanation   Comprehension Verbalized understanding          PT Short Term Goals - 06/04/16 0929      PT SHORT TERM GOAL #1   Title Pt pain in left foot to be no greater than a 2 to allow pt to walk for an hour at a time.    Time 2   Period Weeks   Status Achieved     PT SHORT TERM GOAL #2   Title Pt single leg stance on both legs to be at least 30 seconds to allow pt to feel comfortable walking on uneven terrain in her yard.    Time 4   Period Weeks   Status Achieved     PT SHORT TERM GOAL #3   Title Pt strength of bilateral hip extension and  gastroc musculature to be increased  by 1/2 grade to be comfortable walking up slight inclines for ten  minutes straight   Time 2   Period Weeks   Status Achieved           PT Long Term Goals - 2016-06-10 0931      PT LONG TERM GOAL #1   Title Pain in Left foot to be at a 0 to allow pt to walk in comfort for up to  two hours at a time to go on ship excursions.    Time 4   Period Weeks   Status On-going     PT LONG TERM GOAL #2   Title Pt strength of bilateral LE to be at 4+/5 to allow pt to step up a 12 inch step up with hand support to transverse the [place]    Time 4   Period Weeks   Status Achieved     PT LONG TERM GOAL #3   Title Pt to single leg stance for 60 seconds to feel secure walking over wet rocks on tours.    Time 4   Period Weeks   Status Partially Met     PT LONG TERM GOAL #4   Title Pt to be ambulating with a normal heel toe gait to prevent stress on knees, hips and back    Time 4   Period Weeks   Status Partially Met               Plan - 06/10/16 1406    Clinical Impression Statement Pt see for reassessment.  Pt ROM, balance and strength is now wfl.  Pt is pleased with functional progress and feels she can continue with a HEP.     Rehab Potential Good   PT Frequency 2x / week   PT Duration 4 weeks   PT Treatment/Interventions ADLs/Self Care Home Management;Cryotherapy;Gait training;Stair training;Functional mobility training;Therapeutic activities;Therapeutic exercise;Balance training;Neuromuscular re-education;Patient/family education;Manual techniques   PT Next Visit Plan Pt discharged to home program    PT Home Exercise Plan 05/07/16:  Hip 4 SLR, toe crunch, heel raise, toe raise and Single leg stance. warrior I    Consulted and Agree with Plan of Care Patient      Patient will benefit from skilled therapeutic intervention in order to improve the following deficits and impairments:  Abnormal gait, Decreased activity tolerance, Decreased balance, Decreased range of motion, Decreased strength, Difficulty walking, Increased edema, Pain  Visit Diagnosis: Pain in left ankle and joints of left foot  Muscle weakness (generalized)  Other abnormalities of gait and mobility       G-Codes - 06-10-2016 1408    Functional Assessment Tool Used clinical and  subjective judgement:  Able to walk an hour now.  Improved velocity of gait    Functional Limitation Mobility: Walking and moving around   Mobility: Walking and Moving Around Goal Status [phone]) At least 1 percent but less than 20 percent impaired, limited or restricted   Mobility: Walking and Moving Around Discharge Status [phone]) At least 1 percent but less than 20 percent impaired, limited or restricted      Problem List Patient Active Problem List   Diagnosis Date Noted  . Medicare annual wellness visit, subsequent 02/21/2016  . Chronic radicular pain of lower back 03/13/2014  . Abnormal mammogram of right breast 03/13/2014  . Reduced vision 03/13/2014  . HTN, goal below 140/90 03/08/2014  . Obesity (BMI 30.0-34.9) 02/19/2010  . Vitamin D deficiency 07/27/2009  .  Hyperlipidemia LDL goal <100 02/19/2008  . BLADDER PROLAPSE 02/19/2008    Rayetta Humphrey, PT CLT [phone] 06/04/2016, 2:10 PM  [facility] [address] [place], [place], [address] Phone: [phone]   Fax:  [phone]  Name: YEN WANDELL MRN: [id number] Date of Birth: 1946/05/24  PHYSICAL THERAPY DISCHARGE SUMMARY  Visits from Start of Care: 7  Current functional level related to goals / functional outcomes: See above   Remaining deficits: See above   Education / Equipment: HEP  Plan: Patient agrees to discharge.  Patient goals were partially met. Patient is being discharged due to being pleased with the current functional level.  ?????        Rayetta Humphrey, [place] CLT [phone]

## 2016-06-11 ENCOUNTER — Encounter (HOSPITAL_COMMUNITY): Payer: Medicare Other | Admitting: Physical Therapy

## 2016-06-18 ENCOUNTER — Encounter (HOSPITAL_COMMUNITY): Payer: Medicare Other | Admitting: Physical Therapy

## 2016-06-25 ENCOUNTER — Encounter (HOSPITAL_COMMUNITY): Payer: Medicare Other | Admitting: Physical Therapy

## 2016-07-09 ENCOUNTER — Ambulatory Visit (INDEPENDENT_AMBULATORY_CARE_PROVIDER_SITE_OTHER): Payer: Medicare Other | Admitting: Family Medicine

## 2016-07-09 ENCOUNTER — Encounter: Payer: Self-pay | Admitting: Family Medicine

## 2016-07-09 VITALS — BP 130/80 | HR 94 | Temp 99.4°F | Resp 16 | Ht 62.0 in | Wt 188.0 lb

## 2016-07-09 DIAGNOSIS — Z9109 Other allergy status, other than to drugs and biological substances: Secondary | ICD-10-CM

## 2016-07-09 DIAGNOSIS — I10 Essential (primary) hypertension: Secondary | ICD-10-CM | POA: Diagnosis not present

## 2016-07-09 DIAGNOSIS — J020 Streptococcal pharyngitis: Secondary | ICD-10-CM | POA: Insufficient documentation

## 2016-07-09 LAB — POCT RAPID STREP A (OFFICE): Rapid Strep A Screen: POSITIVE — AB

## 2016-07-09 MED ORDER — PROMETHAZINE-DM 6.25-15 MG/5ML PO SYRP: ORAL_SOLUTION | ORAL | 0 refills | Status: DC

## 2016-07-09 MED ORDER — MONTELUKAST SODIUM 10 MG PO TABS: 10.0000 mg | ORAL_TABLET | Freq: Every day | ORAL | 3 refills | Status: DC

## 2016-07-09 MED ORDER — BENZONATATE 100 MG PO CAPS: 100.0000 mg | ORAL_CAPSULE | Freq: Two times a day (BID) | ORAL | 0 refills | Status: DC | PRN

## 2016-07-09 MED ORDER — AZITHROMYCIN 250 MG PO TABS: ORAL_TABLET | ORAL | 0 refills | Status: DC

## 2016-07-09 NOTE — Patient Instructions (Signed)
F/u as before, call if you need me sooner   You are treated for strep throat and uncontrolled allergies  Zpack  Is prescribed, and antibiotic  Singulair one tablet daily for allergies and cough,.   tessalon perles  One twice daily as needed for cough  Phenergan DM cough suppressant syrup , sleepy side effect, bedtime use only  Please get sudafed, OTC one tablet once daily if needed maximum 5 days

## 2016-07-14 DIAGNOSIS — Z9109 Other allergy status, other than to drugs and biological substances: Secondary | ICD-10-CM | POA: Insufficient documentation

## 2016-07-14 NOTE — Assessment & Plan Note (Signed)
Uncontrolled symptoms with cough and runny nose, medications prescribed including cough suppressant and pt education done

## 2016-07-14 NOTE — Assessment & Plan Note (Signed)
Controlled, no change in medication DASH diet and commitment to daily physical activity for a minimum of 30 minutes discussed and encouraged, as a part of hypertension management. The importance of attaining a healthy weight is also discussed.  BP/Weight 07/09/2016 02/21/2016 12/21/2015 11/07/2015 10/24/2015 [id number] [id number]  Systolic BP [id number]  Diastolic BP 80 80 79 74 70 84 80  Wt. (Lbs) 188 185 181.4 - - 184 183  BMI 34.39 33.83 33.17 - - 32.6 32.43

## 2016-07-14 NOTE — Assessment & Plan Note (Signed)
Symptomatic with positive rapid strep,  z pack prescribed

## 2016-07-14 NOTE — Progress Notes (Signed)
   Yvonne Brewer     MRN: [id number]      DOB: 11-27-1945   HPI Yvonne Brewer is here with a 2  Day h/o generalized malaise, sore throat, dry hack cough,has upcoming travel and wants to be checked. Has body aches and chills, no documented fever No known sick contact C/o excess watery nasal drainage , sneezing ,and watery eyes  ROS  Denies chest pains, palpitations and leg swelling Denies abdominal pain, nausea, vomiting,diarrhea or constipation.   Denies dysuria, frequency, hesitancy or incontinence. Denies joint pain, swelling and limitation in mobility. Denies headaches, seizures, numbness, or tingling. Denies depression, anxiety or insomnia. Denies skin break down or rash.   PE  BP 130/80   Pulse 94   Temp 99.4 F (37.4 C) (Oral)   Resp 16   Ht 5\' 2"  (1.575 m)   Wt 188 lb (85.3 kg)   SpO2 96%   BMI 34.39 kg/m   Patient alert and oriented and in no cardiopulmonary distress.  HEENT: No facial asymmetry, EOMI,   oropharynx erythematous and moist.  Neck supple anterior cervical adenitis, no JVD, no mass.erythema and edema of nasal mucosa, clear drainage from nares  Chest: Clear to auscultation bilaterally.  CVS: S1, S2 no murmurs, no S3.Regular rate.  ABD: Soft non tender.   Ext: No edema  MS: Adequate ROM spine, shoulders, hips and knees.  Skin: Intact, no ulcerations or rash noted.  Psych: Good eye contact, normal affect. Memory intact not anxious or depressed appearing.  CNS: CN 2-12 intact, power,  normal throughout.no focal deficits noted.   Assessment & Plan  Acute streptococcal pharyngitis Symptomatic with positive rapid strep,  z pack prescribed  HTN, goal below 140/90 Controlled, no change in medication DASH diet and commitment to daily physical activity for a minimum of 30 minutes discussed and encouraged, as a part of hypertension management. The importance of attaining a healthy weight is also discussed.  BP/Weight 07/09/2016 02/21/2016  12/21/2015 11/07/2015 10/24/2015 [id number]  Systolic BP [id number]  Diastolic BP 80 80 79 74 70 84 80  Wt. (Lbs) 188 185 181.4 - - 184 183  BMI 34.39 33.83 33.17 - - 32.6 32.43       Environmental allergies Uncontrolled symptoms with cough and runny nose, medications prescribed including cough suppressant and pt education done

## 2016-07-29 ENCOUNTER — Encounter: Payer: Self-pay | Admitting: Family Medicine

## 2016-07-29 ENCOUNTER — Ambulatory Visit (INDEPENDENT_AMBULATORY_CARE_PROVIDER_SITE_OTHER): Payer: Medicare Other | Admitting: Family Medicine

## 2016-07-29 VITALS — BP 130/82 | HR 75 | Resp 16 | Ht 62.0 in | Wt 186.0 lb

## 2016-07-29 DIAGNOSIS — Z1211 Encounter for screening for malignant neoplasm of colon: Secondary | ICD-10-CM

## 2016-07-29 DIAGNOSIS — Z0000 Encounter for general adult medical examination without abnormal findings: Secondary | ICD-10-CM

## 2016-07-29 DIAGNOSIS — N329 Bladder disorder, unspecified: Secondary | ICD-10-CM

## 2016-07-29 DIAGNOSIS — R32 Unspecified urinary incontinence: Secondary | ICD-10-CM

## 2016-07-29 LAB — POC HEMOCCULT BLD/STL (OFFICE/1-CARD/DIAGNOSTIC): FECAL OCCULT BLD: NEGATIVE

## 2016-07-29 NOTE — Assessment & Plan Note (Signed)
Conservative management at this time

## 2016-07-29 NOTE — Assessment & Plan Note (Signed)
Annual exam as documented. Counseling done  re healthy lifestyle involving commitment to 150 minutes exercise per week, heart healthy diet, and attaining healthy weight.The importance of adequate sleep also discussed.  Immunization and cancer screening needs are specifically addressed at this visit.  

## 2016-07-29 NOTE — Patient Instructions (Addendum)
Annual wellness June 29 or after  Please work on good  health habits so that your health will improve. 1. Commitment to daily physical activity for 30 to 60  minutes, if you are able to do this.  2. Commitment to wise food choices. Aim for half of your  food intake to be vegetable and fruit, one quarter starchy foods, and one quarter protein. Try to eat on a regular schedule  3 meals per day, snacking between meals should be limited to vegetables or fruits or small portions of nuts. 64 ounces of water per day is generally recommended, unless you have specific health conditions, like heart failure or kidney failure where you will need to limit fluid intake.  3. Commitment to sufficient and a  good quality of physical and mental rest daily, generally between 6 to 8 hours per day.  WITH PERSISTANCE AND PERSEVERANCE, THE IMPOSSIBLE , BECOMES THE NORM!   Thank you  for choosing [facility]. We consider it a privelige to serve you.  Delivering excellent health care in a caring and  compassionate way is our goal.  Partnering with you,  so that together we can achieve this goal is our strategy.    PLEASE sign for recent labs at school we will send/ contact you with info re results  Please reduce fried and fatty foods and sugar   Commit to daily allergy meds  Urinary Incontinence Urinary incontinence is the involuntary loss of urine from your bladder. CAUSES  There are many causes of urinary incontinence. They include:  Medicines.  Infections.  Prostatic enlargement, leading to overflow of urine from your bladder.  Surgery.  Neurological diseases.  Emotional factors. SIGNS AND SYMPTOMS Urinary Incontinence can be divided into four types: 1. Urge incontinence. Urge incontinence is the involuntary loss of urine before you have the opportunity to go to the bathroom. There is a sudden urge to void but not enough time to reach a bathroom. 2. Stress incontinence. Stress  incontinence is the sudden loss of urine with any activity that forces urine to pass. It is commonly caused by anatomical changes to the pelvis and sphincter areas of your body. 3. Overflow incontinence. Overflow incontinence is the loss of urine from an obstructed opening to your bladder. This results in a backup of urine and a resultant buildup of pressure within the bladder. When the pressure within the bladder exceeds the closing pressure of the sphincter, the urine overflows, which causes incontinence, similar to water overflowing a dam. 4. Total incontinence. Total incontinence is the loss of urine as a result of the inability to store urine within your bladder. DIAGNOSIS  Evaluating the cause of incontinence may require:  A thorough and complete medical and obstetric history.  A complete physical exam.  Laboratory tests such as a urine culture and sensitivities. When additional tests are indicated, they can include:  An ultrasound exam.  Kidney and bladder X-rays.  Cystoscopy. This is an exam of the bladder using a narrow scope.  Urodynamic testing to test the nerve function to the bladder and sphincter areas. TREATMENT  Treatment for urinary incontinence depends on the cause:  For urge incontinence caused by a bacterial infection, antibiotics will be prescribed. If the urge incontinence is related to medicines you take, your health care provider may have you change the medicine.  For stress incontinence, surgery to re-establish anatomical support to the bladder or sphincter, or both, will often correct the condition.  For overflow incontinence caused by  an enlarged prostate, an operation to open the channel through the enlarged prostate will allow the flow of urine out of the bladder. In women with fibroids, a hysterectomy may be recommended.  For total incontinence, surgery on your urinary sphincter may help. An artificial urinary sphincter (an inflatable cuff placed around the  urethra) may be required. In women who have developed a hole-like passage between their bladder and vagina (vesicovaginal fistula), surgery to close the fistula often is required. HOME CARE INSTRUCTIONS  Normal daily hygiene and the use of pads or adult diapers that are changed regularly will help prevent odors and skin damage.  Avoid caffeine. It can overstimulate your bladder.  Use the bathroom regularly. Try about every 2-3 hours to go to the bathroom, even if you do not feel the need to do so. Take time to empty your bladder completely. After urinating, wait a minute. Then try to urinate again.  For causes involving nerve dysfunction, keep a log of the medicines you take and a journal of the times you go to the bathroom. SEEK MEDICAL CARE IF:  You experience worsening of pain instead of improvement in pain after your procedure.  Your incontinence becomes worse instead of better. SEE IMMEDIATE MEDICAL CARE IF:  You experience fever or shaking chills.  You are unable to pass your urine.  You have redness spreading into your groin or down into your thighs. MAKE SURE YOU:  ?Understand these instructions.   Will watch your condition.  Will get help right away if you are not doing well or get worse. This information is not intended to replace advice given to you by your health care provider. Make sure you discuss any questions you have with your health care provider. Document Released: 09/19/2004 Document Revised: 09/02/2014 Document Reviewed: 01/19/2013 Elsevier Interactive Patient Education  2017 [employer].

## 2016-07-29 NOTE — Progress Notes (Signed)
    Yvonne Brewer     MRN: [id number]      DOB: 1946-04-17  HPI: Patient is in for annual physical exam. C/o increased urinary frequency with wetting accidents, will work on behavior modification, less night time awakening . Immunization is reviewed , and  updated if needed.   PE: Pleasant  female, alert and oriented x 3, in no cardio-pulmonary distress. Afebrile. HEENT No facial trauma or asymetry. Sinuses non tender.  Extra occullar muscles intact, pupils equally reactive to light. External ears normal, tympanic membranes clear. Oropharynx moist, no exudate. Neck: supple, no adenopathy,JVD or thyromegaly.No bruits.  Chest: Clear to ascultation bilaterally.No crackles or wheezes. Non tender to palpation  Breast: No asymetry,no masses or lumps. No tenderness. No nipple discharge or inversion. No axillary or supraclavicular adenopathy  Cardiovascular system; Heart sounds normal,  S1 and  S2 ,no S3.  No murmur, or thrill. Apical beat not displaced Peripheral pulses normal.  Abdomen: Soft, non tender, no organomegaly or masses. No bruits. Bowel sounds normal. No guarding, tenderness or rebound.  Rectal:  Normal sphincter tone. No rectal mass. Guaiac negative stool.  GU: External genitalia normal female genitalia , normal female distribution of hair. No lesions. Urethral meatus normal in size, no  Prolapse, no lesions visibly  Present. Bladder at introitus. Vagina pink and moist , with no visible lesions , discharge present . InAdequate pelvic support no  cystocele or rectocele noted Cervix pink and appears healthy, no lesions or ulcerations noted, no discharge noted from os Uterus normal size, no adnexal masses, no cervical motion or adnexal tenderness.   Musculoskeletal exam: Full ROM of spine, hips , shoulders and knees. No deformity ,swelling or crepitus noted. No muscle wasting or atrophy.   Neurologic: Cranial nerves 2 to 12 intact. Power, tone  ,sensation and reflexes normal throughout. No disturbance in gait. No tremor.  Skin: Intact, no ulceration, erythema , scaling or rash noted. Pigmentation normal throughout  Psych; Normal mood and affect. Judgement and concentration normal   Assessment & Plan:  Annual physical exam Annual exam as documented. Counseling done  re healthy lifestyle involving commitment to 150 minutes exercise per week, heart healthy diet, and attaining healthy weight.The importance of adequate sleep also discussed.  Immunization and cancer screening needs are specifically addressed at this visit.   Disorder of bladder Worsened prolapse, behavior modiification for incontinence  Urinary incontinence in female Conservative management at this time

## 2016-07-29 NOTE — Addendum Note (Signed)
Addended by: Denman George B on: 07/29/2016 11:58 AM   Modules accepted: Orders

## 2016-07-29 NOTE — Assessment & Plan Note (Signed)
Worsened prolapse, behavior modiification for incontinence

## 2016-09-03 DIAGNOSIS — R69 Illness, unspecified: Secondary | ICD-10-CM | POA: Diagnosis not present

## 2016-10-01 ENCOUNTER — Other Ambulatory Visit: Payer: Self-pay | Admitting: Family Medicine

## 2016-10-01 DIAGNOSIS — I10 Essential (primary) hypertension: Secondary | ICD-10-CM

## 2016-11-01 ENCOUNTER — Other Ambulatory Visit: Payer: Self-pay | Admitting: Family Medicine

## 2016-11-01 ENCOUNTER — Encounter (INDEPENDENT_AMBULATORY_CARE_PROVIDER_SITE_OTHER): Payer: Self-pay | Admitting: *Deleted

## 2016-11-01 DIAGNOSIS — Z1231 Encounter for screening mammogram for malignant neoplasm of breast: Secondary | ICD-10-CM

## 2016-11-12 DIAGNOSIS — Z0100 Encounter for examination of eyes and vision without abnormal findings: Secondary | ICD-10-CM | POA: Diagnosis not present

## 2016-11-12 DIAGNOSIS — Z961 Presence of intraocular lens: Secondary | ICD-10-CM | POA: Insufficient documentation

## 2016-11-13 ENCOUNTER — Other Ambulatory Visit (INDEPENDENT_AMBULATORY_CARE_PROVIDER_SITE_OTHER): Payer: Self-pay | Admitting: *Deleted

## 2016-11-13 DIAGNOSIS — Z86010 Personal history of colonic polyps: Secondary | ICD-10-CM | POA: Insufficient documentation

## 2016-11-20 DIAGNOSIS — Z1283 Encounter for screening for malignant neoplasm of skin: Secondary | ICD-10-CM | POA: Diagnosis not present

## 2016-11-20 DIAGNOSIS — X32XXXD Exposure to sunlight, subsequent encounter: Secondary | ICD-10-CM | POA: Diagnosis not present

## 2016-11-20 DIAGNOSIS — D225 Melanocytic nevi of trunk: Secondary | ICD-10-CM | POA: Diagnosis not present

## 2016-11-20 DIAGNOSIS — L570 Actinic keratosis: Secondary | ICD-10-CM | POA: Diagnosis not present

## 2017-01-14 ENCOUNTER — Other Ambulatory Visit: Payer: Self-pay | Admitting: Family Medicine

## 2017-01-14 DIAGNOSIS — I10 Essential (primary) hypertension: Secondary | ICD-10-CM

## 2017-02-10 ENCOUNTER — Ambulatory Visit: Payer: Medicare HMO

## 2017-03-04 ENCOUNTER — Ambulatory Visit: Payer: Medicare Other

## 2017-03-06 DIAGNOSIS — Z0000 Encounter for general adult medical examination without abnormal findings: Secondary | ICD-10-CM | POA: Diagnosis not present

## 2017-03-06 DIAGNOSIS — Z6833 Body mass index (BMI) 33.0-33.9, adult: Secondary | ICD-10-CM | POA: Diagnosis not present

## 2017-03-06 DIAGNOSIS — I10 Essential (primary) hypertension: Secondary | ICD-10-CM | POA: Diagnosis not present

## 2017-03-06 DIAGNOSIS — E669 Obesity, unspecified: Secondary | ICD-10-CM | POA: Diagnosis not present

## 2017-03-08 ENCOUNTER — Encounter: Payer: Self-pay | Admitting: Family Medicine

## 2017-03-11 ENCOUNTER — Telehealth: Payer: Self-pay | Admitting: Family Medicine

## 2017-03-11 DIAGNOSIS — E785 Hyperlipidemia, unspecified: Secondary | ICD-10-CM

## 2017-03-11 NOTE — Telephone Encounter (Signed)
New Message  Pt sent this via mychart and calling due to not receiving a reply. Please f/u  Pt also voiced wanting to know is it okay to double up on her medication, possibly one in the morning and at night.  This is a non-urgent medical message. Not really a question.   I had a visit from an [facility] home health nurse on 03/06/17.    I would like to cancel the home nurse visit through Dr. Griffin Dakin office later this month (July 23, I think) because it is too soon after the one several days ago.   She discussed increasing my andoclipine (spelling may be wrong - blood pressure medicine) to 5 mg. dosage because my blood pressure was 138/90.   Please call and tell me if this is done (both cancellation of appt and increase of medication dosage).  My pharmacist is now [facility].  Message can be left on my phone if you do not reach me.  [phone]

## 2017-03-11 NOTE — Telephone Encounter (Signed)
pls see the pt message I sent her  Labs she will need are the fasting lipid, chem 7 and hBa1C, she does need a nurse visit to check her bP in the office before I make a med adjustment

## 2017-03-11 NOTE — Telephone Encounter (Signed)
Called patient regarding message below. No answer, left generic message for patient to return call.   Labs ordered.   Patient needs to come in for nurse visit, then needs to schedule appt for 5 weeks after nurse visit for follow up.

## 2017-03-11 NOTE — Telephone Encounter (Signed)
See message below. I cancelled her appt as requested but she wants to know about increasing her med

## 2017-03-12 ENCOUNTER — Other Ambulatory Visit: Payer: Self-pay | Admitting: Family Medicine

## 2017-03-12 ENCOUNTER — Ambulatory Visit: Payer: Medicare HMO

## 2017-03-12 VITALS — BP 144/82

## 2017-03-12 DIAGNOSIS — I10 Essential (primary) hypertension: Secondary | ICD-10-CM

## 2017-03-12 MED ORDER — AMLODIPINE BESYLATE 5 MG PO TABS: 5.0000 mg | ORAL_TABLET | Freq: Every day | ORAL | 0 refills | Status: DC

## 2017-03-12 NOTE — Evaluation (Signed)
Patient came in for BP check, took lisinopril last night, as usual for her, did have cup of coffee two hours prior to coming in. Is not under stress, but has family visiting from out of town. Denies arm pain, jaw pain, palpitations, or headache.

## 2017-03-12 NOTE — Telephone Encounter (Signed)
Patient came in to office today, was informed of message below, and was given lab orders. Please see nurse visit notes .

## 2017-03-12 NOTE — Telephone Encounter (Signed)
Patient informed of message below, verbalized understanding. Med sent in. Follow up appt made

## 2017-03-12 NOTE — Telephone Encounter (Signed)
pls advise pt of dose increase in amlodipine to 5 mg daily based on blood pressure reading and send in new dose

## 2017-03-17 ENCOUNTER — Ambulatory Visit: Payer: Medicare HMO

## 2017-03-28 ENCOUNTER — Encounter (INDEPENDENT_AMBULATORY_CARE_PROVIDER_SITE_OTHER): Payer: Self-pay | Admitting: *Deleted

## 2017-03-28 ENCOUNTER — Telehealth (INDEPENDENT_AMBULATORY_CARE_PROVIDER_SITE_OTHER): Payer: Self-pay | Admitting: *Deleted

## 2017-03-28 NOTE — Telephone Encounter (Signed)
Patient needs trilyte 

## 2017-03-31 MED ORDER — PEG 3350-KCL-NA BICARB-NACL 420 G PO SOLR: 4000.0000 mL | Freq: Once | ORAL | 0 refills | Status: AC

## 2017-04-03 ENCOUNTER — Ambulatory Visit (HOSPITAL_COMMUNITY): Payer: Medicare Other

## 2017-04-08 DIAGNOSIS — E559 Vitamin D deficiency, unspecified: Secondary | ICD-10-CM | POA: Diagnosis not present

## 2017-04-08 DIAGNOSIS — R7301 Impaired fasting glucose: Secondary | ICD-10-CM | POA: Diagnosis not present

## 2017-04-08 DIAGNOSIS — I10 Essential (primary) hypertension: Secondary | ICD-10-CM | POA: Diagnosis not present

## 2017-04-08 DIAGNOSIS — E785 Hyperlipidemia, unspecified: Secondary | ICD-10-CM | POA: Diagnosis not present

## 2017-04-08 LAB — COMPLETE METABOLIC PANEL WITH GFR
ALT: 15 U/L (ref 6–29)
AST: 17 U/L (ref 10–35)
Albumin: 4 g/dL (ref 3.6–5.1)
Alkaline Phosphatase: 56 U/L (ref 33–130)
BUN: 15 mg/dL (ref 7–25)
CALCIUM: 8.9 mg/dL (ref 8.6–10.4)
CHLORIDE: 105 mmol/L (ref 98–110)
CO2: 27 mmol/L (ref 20–32)
Creat: 0.82 mg/dL (ref 0.60–0.93)
GFR, Est African American: 83 mL/min (ref >=60)
GFR, Est Non African American: 72 mL/min (ref >=60)
Glucose, Bld: 92 mg/dL (ref 65–99)
POTASSIUM: 4.6 mmol/L (ref 3.5–5.3)
SODIUM: 140 mmol/L (ref 135–146)
Total Bilirubin: 0.5 mg/dL (ref 0.2–1.2)
Total Protein: 6.3 g/dL (ref 6.1–8.1)

## 2017-04-08 LAB — LIPID PANEL
CHOL/HDL RATIO: 4.8 ratio (ref <5.0)
CHOLESTEROL: 213 mg/dL — AB (ref <200)
HDL: 44 mg/dL — ABNORMAL LOW (ref >50)
LDL Cholesterol: 143 mg/dL — ABNORMAL HIGH (ref <100)
Triglycerides: 128 mg/dL (ref <150)
VLDL: 26 mg/dL (ref <30)

## 2017-04-09 LAB — HEMOGLOBIN A1C
HEMOGLOBIN A1C: 5.3 % (ref <5.7)
Mean Plasma Glucose: 105 mg/dL

## 2017-04-10 ENCOUNTER — Ambulatory Visit (HOSPITAL_COMMUNITY)
Admission: RE | Admit: 2017-04-10 | Discharge: 2017-04-10 | Disposition: A | Discharge status: Home / Self Care | Payer: Medicare HMO | Source: Ambulatory Visit | Attending: Family Medicine | Admitting: Family Medicine

## 2017-04-10 DIAGNOSIS — Z1231 Encounter for screening mammogram for malignant neoplasm of breast: Secondary | ICD-10-CM | POA: Insufficient documentation

## 2017-04-16 ENCOUNTER — Other Ambulatory Visit: Payer: Self-pay

## 2017-04-16 ENCOUNTER — Ambulatory Visit (INDEPENDENT_AMBULATORY_CARE_PROVIDER_SITE_OTHER): Payer: Medicare HMO | Admitting: Family Medicine

## 2017-04-16 ENCOUNTER — Encounter: Payer: Self-pay | Admitting: Family Medicine

## 2017-04-16 VITALS — BP 120/82 | HR 82 | Resp 16 | Ht 62.0 in | Wt 188.0 lb

## 2017-04-16 DIAGNOSIS — R079 Chest pain, unspecified: Secondary | ICD-10-CM

## 2017-04-16 DIAGNOSIS — R1013 Epigastric pain: Secondary | ICD-10-CM

## 2017-04-16 DIAGNOSIS — M160 Bilateral primary osteoarthritis of hip: Secondary | ICD-10-CM

## 2017-04-16 DIAGNOSIS — E559 Vitamin D deficiency, unspecified: Secondary | ICD-10-CM | POA: Diagnosis not present

## 2017-04-16 DIAGNOSIS — E669 Obesity, unspecified: Secondary | ICD-10-CM

## 2017-04-16 DIAGNOSIS — I10 Essential (primary) hypertension: Secondary | ICD-10-CM

## 2017-04-16 DIAGNOSIS — E785 Hyperlipidemia, unspecified: Secondary | ICD-10-CM | POA: Diagnosis not present

## 2017-04-16 NOTE — Patient Instructions (Addendum)
Annual physical exam mid December, call if you need me before  No caffeine, ypou will be tested for H pylori infection as we discussed in the next 2 weeks , when you have stopped any medication for heartburn, I will also make dr Laural Golden aware of tour symptoms as you have an upcoming colonoscopy planned  Please cut back on red meat and nuts  Excellent blood sugar  CBC in December, tSH and vit D  EKG today, if abnormal you are being referred to cardiology Please work on good  health habits so that your health will improve. 1. Commitment to daily physical activity for 30 to 60  minutes, if you are able to do this.  2. Commitment to wise food choices. Aim for half of your  food intake to be vegetable and fruit, one quarter starchy foods, and one quarter protein. Try to eat on a regular schedule  3 meals per day, snacking between meals should be limited to vegetables or fruits or small portions of nuts. 64 ounces of water per day is generally recommended, unless you have specific health conditions, like heart failure or kidney failure where you will need to limit fluid intake.  3. Commitment to sufficient and a  good quality of physical and mental rest daily, generally between 6 to 8 hours per day.  WITH PERSISTANCE AND PERSEVERANCE, THE IMPOSSIBLE , BECOMES THE NORM! It is important that you exercise regularly at least 30 minutes 5 times a week. If you develop chest pain, have severe difficulty breathing, or feel very tired, stop exercising immediately and seek medical attention  ,ty1

## 2017-04-16 NOTE — Progress Notes (Signed)
Yvonne Brewer     MRN: [id number]      DOB: [birth year]   HPI Yvonne Brewer is here for follow up and re-evaluation of chronic medical conditions, medication management and review of any available recent lab and radiology data.  Preventive health is updated, specifically  Cancer screening and Immunization.   Has upcoming colonoscopy scheduled  The PT denies any adverse reactions to current medications since the last visit.  C/o 2 to 3 month history of increased central chest discomfort, burning, worse when she lies down or bends over, uses OTC medication for relief. Has had one episode in particular where she experienced it as classical reflux / heartburn symptoms. She denies dysphagia, but has a lonng h/o heartburn, drinks caffeine daily Denies palpitations, PND, orthopnea or leg swelling C/o stiffness in hips and mild pain, denies instablity ROS Denies recent fever or chills. Denies sinus pressure, nasal congestion, ear pain or sore throat. Denies chest congestion, productive cough or wheezing. Denies, palpitations and leg swelling Denies , nausea, vomiting,diarrhea or constipation.   Denies dysuria, frequency, hesitancy or incontinence.  Denies headaches, seizures, numbness, or tingling. Denies depression, anxiety or insomnia. Denies skin break down or rash.   PE  BP 120/82   Pulse 82   Resp 16   Ht 5\' 2"  (1.575 m)   Wt 188 lb (85.3 kg)   SpO2 97%   BMI 34.39 kg/m   Patient alert and oriented and in no cardiopulmonary distress.  HEENT: No facial asymmetry, EOMI,   oropharynx pink and moist.  Neck supple no JVD, no mass.  Chest: Clear to auscultation bilaterally.No reproducible chest wall tenderness  CVS: S1, S2 no murmurs, no S3.Regular rate.  ABD: Soft non tender. No uearding or rebound  Ext: No edema  MS: Adequate ROM spine, shoulders, hips and knees.  Skin: Intact, no ulcerations or rash noted.  Psych: Good eye contact, normal affect. Memory intact not  anxious or depressed appearing.  CNS: CN 2-12 intact, power,  normal throughout.no focal deficits noted.   Assessment & Plan  HTN, goal below 140/90 Controlled, no change in medication DASH diet and commitment to daily physical activity for a minimum of 30 minutes discussed and encouraged, as a part of hypertension management. The importance of attaining a healthy weight is also discussed.  BP/Weight 04/16/2017 03/12/2017 07/29/2016 07/09/2016 02/21/2016 12/21/2015 1/96/2229  Systolic BP 798 921 194 174 081 448 185  Diastolic BP 82 82 82 80 80 79 74  Wt. (Lbs) 188 - 186 188 185 181.4 -  BMI 34.39 - 34.02 34.39 33.83 33.17 -       Hyperlipidemia LDL goal <100 Hyperlipidemia:Low fat diet discussed and encouraged. Improved , but mot at goal, encouraged to continue dietary modification   Lipid Panel  Lab Results  Component Value Date   CHOL 213 (H) 04/08/2017   HDL 44 (L) 04/08/2017   LDLCALC 143 (H) 04/08/2017   TRIG 128 04/08/2017   CHOLHDL 4.8 04/08/2017       Obesity (BMI 30.0-34.9) Deteriorated. Patient re-educated about  the importance of commitment to a  minimum of 150 minutes of exercise per week.  The importance of healthy food choices with portion control discussed. Encouraged to start a food diary, count calories and to consider  joining a support group. Sample diet sheets offered. Goals set by the patient for the next several months.   Weight /BMI 04/16/2017 07/29/2016 07/09/2016  WEIGHT 188 lb 186 lb 188 lb  HEIGHT 5\' 2"   5\' 2"  5\' 2"   BMI 34.39 kg/m2 34.02 kg/m2 34.39 kg/m2      Dyspepsia Chronic h/o heartburn and caffeine use , relying on OTC symptomatic relief Will test for H pylori disease and also notify GI of the symptom as she has upcoming colonoscopy. Denies dysphagia  Chest pain 3 month h/o increased substernal discomfort and burning.a lthough history is more c/w GERD Patient has hypertension and hyperlipidemia, needs cardiology evaluation EKG  in office : no LVH, NSR, non specific t wave abnormalities and prolonged QT  Osteoarthritis of both hips C/o bilateral hip pain and stiffness, esp with initiating ,movement,  Denies instability, historically c/w osteoarthritis, will use tylenol as needed, work on weight loss and continue regular exercise

## 2017-04-17 ENCOUNTER — Ambulatory Visit: Payer: Medicare HMO | Admitting: Family Medicine

## 2017-04-18 ENCOUNTER — Other Ambulatory Visit: Payer: Self-pay | Admitting: Family Medicine

## 2017-04-18 DIAGNOSIS — M160 Bilateral primary osteoarthritis of hip: Secondary | ICD-10-CM | POA: Insufficient documentation

## 2017-04-18 DIAGNOSIS — R9431 Abnormal electrocardiogram [ECG] [EKG]: Secondary | ICD-10-CM

## 2017-04-18 DIAGNOSIS — R079 Chest pain, unspecified: Secondary | ICD-10-CM

## 2017-04-18 DIAGNOSIS — E785 Hyperlipidemia, unspecified: Secondary | ICD-10-CM

## 2017-04-18 DIAGNOSIS — R1013 Epigastric pain: Secondary | ICD-10-CM | POA: Insufficient documentation

## 2017-04-18 DIAGNOSIS — I10 Essential (primary) hypertension: Secondary | ICD-10-CM

## 2017-04-18 NOTE — Assessment & Plan Note (Signed)
Chronic h/o heartburn and caffeine use , relying on OTC symptomatic relief Will test for H pylori disease and also notify GI of the symptom as she has upcoming colonoscopy. Denies dysphagia

## 2017-04-18 NOTE — Assessment & Plan Note (Addendum)
3 month h/o increased substernal discomfort and burning.a lthough history is more c/w GERD Patient has hypertension and hyperlipidemia, needs cardiology evaluation EKG in office : no LVH, NSR, non specific t wave abnormalities and prolonged QT

## 2017-04-18 NOTE — Assessment & Plan Note (Addendum)
Hyperlipidemia:Low fat diet discussed and encouraged. Improved , but mot at goal, encouraged to continue dietary modification   Lipid Panel  Lab Results  Component Value Date   CHOL 213 (H) 04/08/2017   HDL 44 (L) 04/08/2017   LDLCALC 143 (H) 04/08/2017   TRIG 128 04/08/2017   CHOLHDL 4.8 04/08/2017

## 2017-04-18 NOTE — Assessment & Plan Note (Signed)
Deteriorated. Patient re-educated about  the importance of commitment to a  minimum of 150 minutes of exercise per week.  The importance of healthy food choices with portion control discussed. Encouraged to start a food diary, count calories and to consider  joining a support group. Sample diet sheets offered. Goals set by the patient for the next several months.   Weight /BMI 04/16/2017 07/29/2016 07/09/2016  WEIGHT 188 lb 186 lb 188 lb  HEIGHT 5\' 2"  5\' 2"  5\' 2"   BMI 34.39 kg/m2 34.02 kg/m2 34.39 kg/m2

## 2017-04-18 NOTE — Assessment & Plan Note (Signed)
C/o bilateral hip pain and stiffness, esp with initiating ,movement,  Denies instability, historically c/w osteoarthritis, will use tylenol as needed, work on weight loss and continue regular exercise

## 2017-04-18 NOTE — Assessment & Plan Note (Signed)
Controlled, no change in medication DASH diet and commitment to daily physical activity for a minimum of 30 minutes discussed and encouraged, as a part of hypertension management. The importance of attaining a healthy weight is also discussed.  BP/Weight 04/16/2017 03/12/2017 07/29/2016 07/09/2016 02/21/2016 12/21/2015 2/82/0601  Systolic BP 561 537 943 276 147 092 957  Diastolic BP 82 82 82 80 80 79 74  Wt. (Lbs) 188 - 186 188 185 181.4 -  BMI 34.39 - 34.02 34.39 33.83 33.17 -

## 2017-04-21 ENCOUNTER — Telehealth (INDEPENDENT_AMBULATORY_CARE_PROVIDER_SITE_OTHER): Payer: Self-pay | Admitting: *Deleted

## 2017-04-21 NOTE — Telephone Encounter (Signed)
Referring MD/PCP: simpson   Procedure: tcs  Reason/Indication:  Hx polyps  Has patient had this procedure before?  Yes, 2013 -- epic  If so, when, by whom and where?    Is there a family history of colon cancer?  no  Who?  What age when diagnosed?    Is patient diabetic?   no      Does patient have prosthetic heart valve or mechanical valve?  no  Do you have a pacemaker?  no  Has patient ever had endocarditis? no  Has patient had joint replacement within last 12 months?  no  Does patient tend to be constipated or take laxatives? no  Does patient have a history of alcohol/drug use?  no  Is patient on Coumadin, Plavix and/or Aspirin? no  Medications: amlodipine 5 mg  Allergies: see epic  Medication Adjustment per Dr Laural Golden:   Procedure date & time: 05/21/17 at 1030

## 2017-04-21 NOTE — Telephone Encounter (Signed)
agree

## 2017-04-30 DIAGNOSIS — E559 Vitamin D deficiency, unspecified: Secondary | ICD-10-CM | POA: Diagnosis not present

## 2017-04-30 DIAGNOSIS — R1013 Epigastric pain: Secondary | ICD-10-CM | POA: Diagnosis not present

## 2017-04-30 DIAGNOSIS — I10 Essential (primary) hypertension: Secondary | ICD-10-CM | POA: Diagnosis not present

## 2017-05-01 LAB — CBC
HCT: 40.8 % (ref 35.0–45.0)
Hemoglobin: 13.5 g/dL (ref 11.7–15.5)
MCH: 31 pg (ref 27.0–33.0)
MCHC: 33.1 g/dL (ref 32.0–36.0)
MCV: 93.8 fL (ref 80.0–100.0)
MPV: 10 fL (ref 7.5–12.5)
PLATELETS: 337 10*3/uL (ref 140–400)
RBC: 4.35 10*6/uL (ref 3.80–5.10)
RDW: 12 % (ref 11.0–15.0)
WBC: 5.6 10*3/uL (ref 3.8–10.8)

## 2017-05-01 LAB — H. PYLORI BREATH TEST: H. PYLORI BREATH TEST: NOT DETECTED

## 2017-05-01 LAB — TSH: TSH: 1.18 m[IU]/L (ref 0.40–4.50)

## 2017-05-01 LAB — VITAMIN D 25 HYDROXY (VIT D DEFICIENCY, FRACTURES): VIT D 25 HYDROXY: 21 ng/mL — AB (ref 30–100)

## 2017-05-02 ENCOUNTER — Encounter: Payer: Self-pay | Admitting: Family Medicine

## 2017-05-12 ENCOUNTER — Encounter (INDEPENDENT_AMBULATORY_CARE_PROVIDER_SITE_OTHER): Payer: Self-pay

## 2017-05-12 ENCOUNTER — Telehealth: Payer: Self-pay | Admitting: Family Medicine

## 2017-05-12 NOTE — Telephone Encounter (Signed)
Patient stopped by to let Dr.Simpson know she received her letter about being vitamin D deficient, she would like to discuss taking a high dose 1x a month.  Cb#: [id number]

## 2017-05-13 ENCOUNTER — Other Ambulatory Visit: Payer: Self-pay | Admitting: Family Medicine

## 2017-05-13 NOTE — Telephone Encounter (Signed)
Ps  Call pt tomorrow and let her kn that I am  Sorry that  I missed her.today, I spoke with her spouse , she was at a meeting. Pls let her know that with her vit D level at 21, it is absolutely reasonable for her to take prescription once weekly vit D3 50, 000 IU for 6 months, then transition to OTC once daily vit D3 2000 IU. I have entered the weekly vit D3 and you may send to her pharmacy if she agrees

## 2017-05-14 MED ORDER — ERGOCALCIFEROL 1.25 MG (50000 UT) PO CAPS: 50000.0000 [IU] | ORAL_CAPSULE | ORAL | 1 refills | Status: DC

## 2017-05-14 NOTE — Telephone Encounter (Signed)
Called patient regarding message below. No answer, left generic message for patient to return call.   

## 2017-05-21 ENCOUNTER — Encounter (HOSPITAL_COMMUNITY)
Admission: RE | Disposition: A | Discharge status: Home / Self Care | Payer: Self-pay | Source: Ambulatory Visit | Attending: Internal Medicine

## 2017-05-21 ENCOUNTER — Encounter (HOSPITAL_COMMUNITY): Payer: Self-pay | Admitting: *Deleted

## 2017-05-21 ENCOUNTER — Ambulatory Visit (HOSPITAL_COMMUNITY)
Admission: RE | Admit: 2017-05-21 | Discharge: 2017-05-21 | Disposition: A | Discharge status: Home / Self Care | Payer: Medicare HMO | Source: Ambulatory Visit | Attending: Internal Medicine | Admitting: Internal Medicine

## 2017-05-21 DIAGNOSIS — Z1211 Encounter for screening for malignant neoplasm of colon: Secondary | ICD-10-CM | POA: Diagnosis not present

## 2017-05-21 DIAGNOSIS — E785 Hyperlipidemia, unspecified: Secondary | ICD-10-CM | POA: Diagnosis not present

## 2017-05-21 DIAGNOSIS — Z85828 Personal history of other malignant neoplasm of skin: Secondary | ICD-10-CM | POA: Insufficient documentation

## 2017-05-21 DIAGNOSIS — E669 Obesity, unspecified: Secondary | ICD-10-CM | POA: Diagnosis not present

## 2017-05-21 DIAGNOSIS — Z860100 Personal history of colon polyps, unspecified: Secondary | ICD-10-CM | POA: Insufficient documentation

## 2017-05-21 DIAGNOSIS — Z6831 Body mass index (BMI) 31.0-31.9, adult: Secondary | ICD-10-CM | POA: Diagnosis not present

## 2017-05-21 DIAGNOSIS — K648 Other hemorrhoids: Secondary | ICD-10-CM | POA: Diagnosis not present

## 2017-05-21 DIAGNOSIS — K5730 Diverticulosis of large intestine without perforation or abscess without bleeding: Secondary | ICD-10-CM | POA: Diagnosis not present

## 2017-05-21 DIAGNOSIS — Z79899 Other long term (current) drug therapy: Secondary | ICD-10-CM | POA: Diagnosis not present

## 2017-05-21 DIAGNOSIS — I10 Essential (primary) hypertension: Secondary | ICD-10-CM | POA: Insufficient documentation

## 2017-05-21 DIAGNOSIS — Z09 Encounter for follow-up examination after completed treatment for conditions other than malignant neoplasm: Secondary | ICD-10-CM | POA: Diagnosis not present

## 2017-05-21 HISTORY — PX: COLONOSCOPY: SHX5424

## 2017-05-21 SURGERY — COLONOSCOPY
Anesthesia: Moderate Sedation

## 2017-05-21 MED ORDER — MEPERIDINE HCL 50 MG/ML IJ SOLN
INTRAMUSCULAR | Status: AC
  Filled 2017-05-21: qty 1

## 2017-05-21 MED ORDER — MIDAZOLAM HCL 5 MG/5ML IJ SOLN
INTRAMUSCULAR | Status: AC
  Filled 2017-05-21: qty 10

## 2017-05-21 MED ORDER — STERILE WATER FOR IRRIGATION IR SOLN
Status: DC | PRN
  Administered 2017-05-21: 11:00:00

## 2017-05-21 MED ORDER — SODIUM CHLORIDE 0.9 % IV SOLN
INTRAVENOUS | Status: DC
  Administered 2017-05-21: 1000 mL via INTRAVENOUS

## 2017-05-21 MED ORDER — MIDAZOLAM HCL 5 MG/5ML IJ SOLN
INTRAMUSCULAR | Status: DC | PRN
Start: 2017-05-21 — End: 2017-05-21
  Administered 2017-05-21: 2 mg via INTRAVENOUS

## 2017-05-21 MED ORDER — MEPERIDINE HCL 50 MG/ML IJ SOLN
INTRAMUSCULAR | Status: DC | PRN
  Administered 2017-05-21: 15 mg via INTRAVENOUS
  Administered 2017-05-21: 25 mg via INTRAVENOUS

## 2017-05-21 NOTE — H&P (Signed)
Yvonne Brewer is an 71 y.o. female.   Chief Complaint: patient is here for colonoscopy. HPI: patient is 71 year old Caucasian female who is here for surveillance colonoscopy. She has history of colonic adenomas. She denies abdominal pain change in bowel habits or rectal bleeding. She generally has 2-3 bowel movements every morning all of her stools are formed. Family history is negative for CRC.   Past Medical History:  Diagnosis Date  . Cancer ([place])    skin cancer  . Cystitis, unspecified   . Female bladder prolapse   . Hyperlipidemia    diet managed , but uncontrolled  . Hypertension 2015   staring medication 02/2014  . Obesity     Past Surgical History:  Procedure Laterality Date  . CATARACT EXTRACTION  2012   bilateral   . COLONOSCOPY  11/06/2011   Procedure: COLONOSCOPY;  Surgeon: Rogene Houston, MD;  Location: [facility];  Service: Endoscopy;  Laterality: N/A;  7:30  . EYE SURGERY  2011   bilateral cataract with implants  . HAMMER TOE SURGERY Left 01/2016  . left bunionectomy  2002    . squamous cell ca rt ant chest 1989    . YAG LASER APPLICATION Left 6/59/9357   Procedure: YAG LASER APPLICATION;  Surgeon: Rutherford Guys, MD;  Location: [facility];  Service: Ophthalmology;  Laterality: Left;  . YAG LASER APPLICATION Right 0/17/7939   Procedure: YAG LASER APPLICATION;  Surgeon: Rutherford Guys, MD;  Location: [facility];  Service: Ophthalmology;  Laterality: Right;    Family History  Problem Relation Age of Onset  . Stroke Mother   . Hypertension Mother   . Heart disease Father   . Cancer Maternal Grandmother   . Anesthesia problems Neg Hx   . Hypotension Neg Hx   . Malignant hyperthermia Neg Hx   . Pseudochol deficiency Neg Hx    Social History:  reports that she has never smoked. She has never used smokeless tobacco. She reports that she drinks alcohol. She reports that she does not use drugs.  Allergies:  Allergies  Allergen Reactions  . Ace Inhibitors Cough   Cough  . Penicillins Hives    Has patient had a PCN reaction causing immediate rash, facial/tongue/throat swelling, SOB or lightheadedness with hypotension: No Has patient had a PCN reaction causing severe rash involving mucus membranes or skin necrosis: No Has patient had a PCN reaction that required hospitalization No Has patient had a PCN reaction occurring within the last 10 years: No If all of the above answers are "NO", then may proceed with Cephalosporin use.   . Sulfonamide Derivatives Hives    Medications Prior to Admission  Medication Sig Dispense Refill  . amLODipine (NORVASC) 5 MG tablet Take 1 tablet (5 mg total) by mouth daily. 90 tablet 0  . Calcium Carbonate Antacid (TUMS ULTRA PO) Take 1-2 tablets by mouth 3 (three) times daily as needed. For indigestion/heartburn.    Marland Kitchen GAVILYTE-N WITH FLAVOR PACK 420 g solution Take 4,000 mLs by mouth once.  0  . ergocalciferol (VITAMIN D2) 50000 units capsule Take 1 capsule (50,000 Units total) by mouth once a week. 12 capsule 1    No results found for this or any previous visit (from the past 48 hour(s)). No results found.  ROS  Blood pressure 135/66, pulse 70, temperature 97.6 F (36.4 C), temperature source Oral, resp. rate 16, height 5\' 2"  (1.575 m), weight 170 lb (77.1 kg), SpO2 100 %. Physical Exam  Constitutional: She appears  well-developed and well-nourished.  HENT:  Mouth/Throat: Oropharynx is clear and moist.  Eyes: Conjunctivae are normal. No scleral icterus.  Neck: No thyromegaly present.  Cardiovascular: Normal rate, regular rhythm and normal heart sounds.   No murmur heard. Respiratory: Effort normal and breath sounds normal.  GI: Soft. She exhibits no distension and no mass. There is no tenderness.  Musculoskeletal: She exhibits no edema.  Lymphadenopathy:    She has no cervical adenopathy.  Neurological: She is alert.  Skin: Skin is warm and dry.     Assessment/Plan History of colonic adenomas  Hildred Laser, MD 05/21/2017, 10:32 AM

## 2017-05-21 NOTE — Op Note (Signed)
[facility] Patient Name: Yvonne Brewer Procedure Date: 05/21/2017 10:07 AM MRN: [id number] Date of Birth: [birth year] Attending MD: Hildred Laser , MD CSN: [id number] Age: 71 Admit Type: Outpatient Procedure:                Colonoscopy Indications:              High risk colon cancer surveillance: Personal                            history of colonic polyps Providers:                Hildred Laser, MD, Otis Peak B. Sharon Seller, RN, Janeece Riggers, RN Referring MD:             Norwood Levo. Moshe Cipro, MD Medicines:                Meperidine 40 mg IV, Midazolam 2 mg IV Complications:            No immediate complications. Estimated Blood Loss:     Estimated blood loss: none. Procedure:                Pre-Anesthesia Assessment:                           - Prior to the procedure, a History and Physical                            was performed, and patient medications and                            allergies were reviewed. The patient's tolerance of                            previous anesthesia was also reviewed. The risks                            and benefits of the procedure and the sedation                            options and risks were discussed with the patient.                            All questions were answered, and informed consent                            was obtained. Prior Anticoagulants: The patient has                            taken no previous anticoagulant or antiplatelet                            agents. ASA Grade Assessment: I - A normal, healthy  patient. After reviewing the risks and benefits,                            the patient was deemed in satisfactory condition to                            undergo the procedure.                           After obtaining informed consent, the colonoscope                            was passed under direct vision. Throughout the                            procedure, the  patient's blood pressure, pulse, and                            oxygen saturations were monitored continuously. The                            [id number] ([id number]) was introduced through the                            anus and advanced to the the cecum, identified by                            appendiceal orifice and ileocecal valve. The                            colonoscopy was performed without difficulty. The                            patient tolerated the procedure well. The quality                            of the bowel preparation was good. The ileocecal                            valve, appendiceal orifice, and rectum were                            photographed. Scope In: 10:37:47 AM Scope Out: 11:03:29 AM Scope Withdrawal Time: 0 hours 10 minutes 11 seconds  Total Procedure Duration: 0 hours 25 minutes 42 seconds  Findings:      The perianal and digital rectal examinations were normal.      Multiple small and large-mouthed diverticula were found in the sigmoid       colon and descending colon.      The exam was otherwise without abnormality.      Internal hemorrhoids were found during retroflexion. The hemorrhoids       were small. Impression:               - Diverticulosis in the sigmoid colon and in the  descending colon.                           - The examination was otherwise normal.                           - Internal hemorrhoids.                           - No specimens collected. Moderate Sedation:      Moderate (conscious) sedation was administered by the endoscopy nurse       and supervised by the endoscopist. The following parameters were       monitored: oxygen saturation, heart rate, blood pressure, CO2       capnography and response to care. Total physician intraservice time was       26 minutes. Recommendation:           - Patient has a contact number available for                            emergencies. The signs and symptoms of  potential                            delayed complications were discussed with the                            patient. Return to normal activities tomorrow.                            Written discharge instructions were provided to the                            patient.                           - High fiber diet today.                           - Continue present medications.                           - Repeat colonoscopy in 7 years for surveillance. Procedure Code(s):        --- Professional ---                           [phone], Colonoscopy, flexible; diagnostic, including                            collection of specimen(s) by brushing or washing,                            when performed (separate procedure)                           99152, Moderate sedation services provided by the  same physician or other qualified health care                            professional performing the diagnostic or                            therapeutic service that the sedation supports,                            requiring the presence of an independent trained                            observer to assist in the monitoring of the                            patient's level of consciousness and physiological                            status; initial 15 minutes of intraservice time,                            patient age 3 years or older                           [phone], Moderate sedation services; each additional                            15 minutes intraservice time Diagnosis Code(s):        --- Professional ---                           Z86.010, Personal history of colonic polyps                           K64.8, Other hemorrhoids                           K57.30, Diverticulosis of large intestine without                            perforation or abscess without bleeding CPT copyright 2016 [employer]. All rights reserved. The codes documented in this  report are preliminary and upon coder review may  be revised to meet current compliance requirements. Hildred Laser, MD Hildred Laser, MD 05/21/2017 11:13:44 AM This report has been signed electronically. Number of Addenda: 0

## 2017-05-21 NOTE — Discharge Instructions (Signed)
Resume usual medications and high fiber diet. No driving for 24 hours. May consider another colonoscopy in 7 years.       Colonoscopy, Adult, Care After This sheet gives you information about how to care for yourself after your procedure. Your doctor may also give you more specific instructions. If you have problems or questions, call your doctor. Follow these instructions at home: General instructions   For the first 24 hours after the procedure: ? Do not drive or use machinery. ? Do not sign important documents. ? Do not drink alcohol. ? Do your daily activities more slowly than normal. ? Eat foods that are soft and easy to digest. ? Rest often.  Take over-the-counter or prescription medicines only as told by your doctor.  It is up to you to get the results of your procedure. Ask your doctor, or the department performing the procedure, when your results will be ready. To help cramping and bloating:  Try walking around.  Put heat on your belly (abdomen) as told by your doctor. Use a heat source that your doctor recommends, such as a moist heat pack or a heating pad. ? Put a towel between your skin and the heat source. ? Leave the heat on for 20-30 minutes. ? Remove the heat if your skin turns bright red. This is especially important if you cannot feel pain, heat, or cold. You can get burned. Eating and drinking  Drink enough fluid to keep your pee (urine) clear or pale yellow.  Return to your normal diet as told by your doctor. Avoid heavy or fried foods that are hard to digest.  Avoid drinking alcohol for as long as told by your doctor. Contact a doctor if:  You have blood in your poop (stool) 2-3 days after the procedure. Get help right away if:  You have more than a small amount of blood in your poop.  You see large clumps of tissue (blood clots) in your poop.  Your belly is swollen.  You feel sick to your stomach (nauseous).  You throw up (vomit).  You  have a fever.  You have belly pain that gets worse, and medicine does not help your pain. This information is not intended to replace advice given to you by your health care provider. Make sure you discuss any questions you have with your health care provider. Document Released: 09/14/2010 Document Revised: 05/06/2016 Document Reviewed: 05/06/2016 Elsevier Interactive Patient Education  2017 [employer]     Diverticulosis Diverticulosis is a condition that develops when small pouches (diverticula) form in the wall of the large intestine (colon). The colon is where water is absorbed and stool is formed. The pouches form when the inside layer of the colon pushes through weak spots in the outer layers of the colon. You may have a few pouches or many of them. What are the causes? The cause of this condition is not known. What increases the risk? The following factors may make you more likely to develop this condition:  Being older than age 32. Your risk for this condition increases with age. Diverticulosis is rare among people younger than age 32. By age 13, many people have it.  Eating a low-fiber diet.  Having frequent constipation.  Being overweight.  Not getting enough exercise.  Smoking.  Taking over-the-counter pain medicines, like aspirin and ibuprofen.  Having a family history of diverticulosis.  What are the signs or symptoms? In most people, there are no symptoms of this condition. If  you do have symptoms, they may include:  Bloating.  Cramps in the abdomen.  Constipation or diarrhea.  Pain in the lower left side of the abdomen.  How is this diagnosed? This condition is most often diagnosed during an exam for other colon problems. Because diverticulosis usually has no symptoms, it often cannot be diagnosed independently. This condition may be diagnosed by:  Using a flexible scope to examine the colon (colonoscopy).  Taking an X-ray of the colon after dye has  been put into the colon (barium enema).  Doing a CT scan.  How is this treated? You may not need treatment for this condition if you have never developed an infection related to diverticulosis. If you have had an infection before, treatment may include:  Eating a high-fiber diet. This may include eating more fruits, vegetables, and grains.  Taking a fiber supplement.  Taking a live bacteria supplement (probiotic).  Taking medicine to relax your colon.  Taking antibiotic medicines.  Follow these instructions at home:  Drink 6-8 glasses of water or more each day to prevent constipation.  Try not to strain when you have a bowel movement.  If you have had an infection before: ? Eat more fiber as directed by your health care provider or your diet and nutrition specialist (dietitian). ? Take a fiber supplement or probiotic, if your health care provider approves.  Take over-the-counter and prescription medicines only as told by your health care provider.  If you were prescribed an antibiotic, take it as told by your health care provider. Do not stop taking the antibiotic even if you start to feel better.  Keep all follow-up visits as told by your health care provider. This is important. Contact a health care provider if:  You have pain in your abdomen.  You have bloating.  You have cramps.  You have not had a bowel movement in 3 days. Get help right away if:  Your pain gets worse.  Your bloating becomes very bad.  You have a fever or chills, and your symptoms suddenly get worse.  You vomit.  You have bowel movements that are bloody or black.  You have bleeding from your rectum. Summary  Diverticulosis is a condition that develops when small pouches (diverticula) form in the wall of the large intestine (colon).  You may have a few pouches or many of them.  This condition is most often diagnosed during an exam for other colon problems.  If you have had an  infection related to diverticulosis, treatment may include increasing the fiber in your diet, taking supplements, or taking medicines. This information is not intended to replace advice given to you by your health care provider. Make sure you discuss any questions you have with your health care provider. Document Released: 05/09/2004 Document Revised: 07/01/2016 Document Reviewed: 07/01/2016 Elsevier Interactive Patient Education  2017 [employer].    Hemorrhoids Hemorrhoids are swollen veins in and around the rectum or anus. There are two types of hemorrhoids:  Internal hemorrhoids. These occur in the veins that are just inside the rectum. They may poke through to the outside and become irritated and painful.  External hemorrhoids. These occur in the veins that are outside of the anus and can be felt as a painful swelling or hard lump near the anus.  Most hemorrhoids do not cause serious problems, and they can be managed with home treatments such as diet and lifestyle changes. If home treatments do not help your symptoms, procedures can be  done to shrink or remove the hemorrhoids. What are the causes? This condition is caused by increased pressure in the anal area. This pressure may result from various things, including:  Constipation.  Straining to have a bowel movement.  Diarrhea.  Pregnancy.  Obesity.  Sitting for long periods of time.  Heavy lifting or other activity that causes you to strain.  Anal sex.  What are the signs or symptoms? Symptoms of this condition include:  Pain.  Anal itching or irritation.  Rectal bleeding.  Leakage of stool (feces).  Anal swelling.  One or more lumps around the anus.  How is this diagnosed? This condition can often be diagnosed through a visual exam. Other exams or tests may also be done, such as:  Examination of the rectal area with a gloved hand (digital rectal exam).  Examination of the anal canal using a small tube  (anoscope).  A blood test, if you have lost a significant amount of blood.  A test to look inside the colon (sigmoidoscopy or colonoscopy).  How is this treated? This condition can usually be treated at home. However, various procedures may be done if dietary changes, lifestyle changes, and other home treatments do not help your symptoms. These procedures can help make the hemorrhoids smaller or remove them completely. Some of these procedures involve surgery, and others do not. Common procedures include:  Rubber band ligation. Rubber bands are placed at the base of the hemorrhoids to cut off the blood supply to them.  Sclerotherapy. Medicine is injected into the hemorrhoids to shrink them.  Infrared coagulation. A type of light energy is used to get rid of the hemorrhoids.  Hemorrhoidectomy surgery. The hemorrhoids are surgically removed, and the veins that supply them are tied off.  Stapled hemorrhoidopexy surgery. A circular stapling device is used to remove the hemorrhoids and use staples to cut off the blood supply to them.  Follow these instructions at home: Eating and drinking  Eat foods that have a lot of fiber in them, such as whole grains, beans, nuts, fruits, and vegetables. Ask your health care provider about taking products that have added fiber (fiber supplements).  Drink enough fluid to keep your urine clear or pale yellow. Managing pain and swelling  Take warm sitz baths for 20 minutes, 3-4 times a day to ease pain and discomfort.  If directed, apply ice to the affected area. Using ice packs between sitz baths may be helpful. ? Put ice in a plastic bag. ? Place a towel between your skin and the bag. ? Leave the ice on for 20 minutes, 2-3 times a day. General instructions  Take over-the-counter and prescription medicines only as told by your health care provider.  Use medicated creams or suppositories as told.  Exercise regularly.  Go to the bathroom when you  have the urge to have a bowel movement. Do not wait.  Avoid straining to have bowel movements.  Keep the anal area dry and clean. Use wet toilet paper or moist towelettes after a bowel movement.  Do not sit on the toilet for long periods of time. This increases blood pooling and pain. Contact a health care provider if:  You have increasing pain and swelling that are not controlled by treatment or medicine.  You have uncontrolled bleeding.  You have difficulty having a bowel movement, or you are unable to have a bowel movement.  You have pain or inflammation outside the area of the hemorrhoids. This information is not intended  to replace advice given to you by your health care provider. Make sure you discuss any questions you have with your health care provider. Document Released: 08/09/2000 Document Revised: 01/10/2016 Document Reviewed: 04/26/2015 Elsevier Interactive Patient Education  2017 [employer].    High-Fiber Diet Fiber, also called dietary fiber, is a type of carbohydrate found in fruits, vegetables, whole grains, and beans. A high-fiber diet can have many health benefits. Your health care provider may recommend a high-fiber diet to help:  Prevent constipation. Fiber can make your bowel movements more regular.  Lower your cholesterol.  Relieve hemorrhoids, uncomplicated diverticulosis, or irritable bowel syndrome.  Prevent overeating as part of a weight-loss plan.  Prevent heart disease, type 2 diabetes, and certain cancers.  What is my plan? The recommended daily intake of fiber includes:  38 grams for men under age 14.  73 grams for men over age 30.  52 grams for women under age 67.  19 grams for women over age 39.  You can get the recommended daily intake of dietary fiber by eating a variety of fruits, vegetables, grains, and beans. Your health care provider may also recommend a fiber supplement if it is not possible to get enough fiber through your  diet. What do I need to know about a high-fiber diet?  Fiber supplements have not been widely studied for their effectiveness, so it is better to get fiber through food sources.  Always check the fiber content on thenutrition facts label of any prepackaged food. Look for foods that contain at least 5 grams of fiber per serving.  Ask your dietitian if you have questions about specific foods that are related to your condition, especially if those foods are not listed in the following section.  Increase your daily fiber consumption gradually. Increasing your intake of dietary fiber too quickly may cause bloating, cramping, or gas.  Drink plenty of water. Water helps you to digest fiber. What foods can I eat? Grains Whole-grain breads. Multigrain cereal. Oats and oatmeal. Brown rice. Barley. Bulgur wheat. [place]. Bran muffins. Popcorn. Rye wafer crackers. Vegetables Sweet potatoes. Spinach. Kale. Artichokes. Cabbage. Broccoli. Green peas. Carrots. Squash. Fruits Berries. Pears. Apples. Oranges. Avocados. Prunes and raisins. Dried figs. Meats and Other Protein Sources [place], kidney, pinto, and soy beans. Split peas. Lentils. Nuts and seeds. Dairy Fiber-fortified yogurt. Beverages Fiber-fortified soy milk. Fiber-fortified orange juice. Other Fiber bars. The items listed above may not be a complete list of recommended foods or beverages. Contact your dietitian for more options. What foods are not recommended? Grains White bread. Pasta made with refined flour. White rice. Vegetables Fried potatoes. Canned vegetables. Well-cooked vegetables. Fruits Fruit juice. Cooked, strained fruit. Meats and Other Protein Sources Fatty cuts of meat. Fried Sales executive or fried fish. Dairy Milk. Yogurt. Cream cheese. Sour cream. Beverages Soft drinks. Other Cakes and pastries. Butter and oils. The items listed above may not be a complete list of foods and beverages to avoid. Contact your dietitian for  more information. What are some tips for including high-fiber foods in my diet?  Eat a wide variety of high-fiber foods.  Make sure that half of all grains consumed each day are whole grains.  Replace breads and cereals made from refined flour or white flour with whole-grain breads and cereals.  Replace white rice with brown rice, bulgur wheat, or millet.  Start the day with a breakfast that is high in fiber, such as a cereal that contains at least 5 grams of fiber per serving.  Use beans in place of meat in soups, salads, or pasta.  Eat high-fiber snacks, such as berries, raw vegetables, nuts, or popcorn. This information is not intended to replace advice given to you by your health care provider. Make sure you discuss any questions you have with your health care provider. Document Released: 08/12/2005 Document Revised: 01/18/2016 Document Reviewed: 01/25/2014 Elsevier Interactive Patient Education  2017 [employer].

## 2017-05-23 ENCOUNTER — Encounter (HOSPITAL_COMMUNITY): Payer: Self-pay | Admitting: Internal Medicine

## 2017-06-04 ENCOUNTER — Encounter: Payer: Self-pay | Admitting: Cardiology

## 2017-06-04 ENCOUNTER — Ambulatory Visit (INDEPENDENT_AMBULATORY_CARE_PROVIDER_SITE_OTHER): Payer: Medicare HMO | Admitting: Cardiology

## 2017-06-04 VITALS — BP 122/78 | HR 85 | Ht 62.0 in | Wt 187.0 lb

## 2017-06-04 DIAGNOSIS — E782 Mixed hyperlipidemia: Secondary | ICD-10-CM | POA: Diagnosis not present

## 2017-06-04 DIAGNOSIS — K219 Gastro-esophageal reflux disease without esophagitis: Secondary | ICD-10-CM | POA: Diagnosis not present

## 2017-06-04 DIAGNOSIS — R011 Cardiac murmur, unspecified: Secondary | ICD-10-CM | POA: Diagnosis not present

## 2017-06-04 DIAGNOSIS — R0789 Other chest pain: Secondary | ICD-10-CM

## 2017-06-04 NOTE — Progress Notes (Signed)
Cardiology Office Note  Date: 06/04/2017   ID: Arliss, Hepburn February 01, 1946, MRN [id number]  PCP: Fayrene Helper, MD  Consulting Cardiologist: Rozann Lesches, MD   Chief Complaint  Patient presents with  . Chest Pain    History of Present Illness: Yvonne Brewer is a 71 y.o. female referred for cardiology consultation by Dr. Moshe Cipro for the evaluation of chest pain. We discussed her symptoms today. She states that over the last 6 months she has been having heartburn, describes bitter taste and water brash in the back of her throat particularly when she bends over sometimes or when she is supine. Also feeling of heartburn in her upper chest. This tends to be in the evenings after meals. She has used antacids with benefit, recently started on over-the-counter omeprazole. She is trying to modify her diet, cutting back acidic foods and caffeine.  From a functional perspective, she does not report any exertional chest pain. She does a vigorous water aerobics class 3 days a week and states that she feels well while she is doing it, not unusually short of breath or fatigued. She also plans to start walking more for exercise.  She does have a history of hypertension, blood pressure is well controlled today on Norvasc. I personally reviewed her recent ECG which is outlined below, overall nonspecific.  Past Medical History:  Diagnosis Date  . Essential hypertension 2015  . Female bladder prolapse   . History of cystitis   . History of skin cancer   . Hyperlipidemia   . Obesity     Past Surgical History:  Procedure Laterality Date  . CATARACT EXTRACTION  2012   bilateral   . COLONOSCOPY  11/06/2011   Procedure: COLONOSCOPY;  Surgeon: Rogene Houston, MD;  Location: [facility];  Service: Endoscopy;  Laterality: N/A;  7:30  . COLONOSCOPY N/A 05/21/2017   Procedure: COLONOSCOPY;  Surgeon: Rogene Houston, MD;  Location: [facility];  Service: Endoscopy;  Laterality: N/A;   730-moved to 9/26 @10 :30am per Lelon Frohlich  . EYE SURGERY  2011   bilateral cataract with implants  . HAMMER TOE SURGERY Left 01/2016  . Left bunionectomy    . Squamous cell ca rt ant chest 1989    . YAG LASER APPLICATION Left 2/35/3614   Procedure: YAG LASER APPLICATION;  Surgeon: Rutherford Guys, MD;  Location: [facility];  Service: Ophthalmology;  Laterality: Left;  . YAG LASER APPLICATION Right 4/31/5400   Procedure: YAG LASER APPLICATION;  Surgeon: Rutherford Guys, MD;  Location: [facility];  Service: Ophthalmology;  Laterality: Right;    Current Outpatient Prescriptions  Medication Sig Dispense Refill  . amLODipine (NORVASC) 5 MG tablet Take 1 tablet (5 mg total) by mouth daily. 90 tablet 0  . Calcium Carbonate Antacid (TUMS ULTRA PO) Take 1-2 tablets by mouth 3 (three) times daily as needed. For indigestion/heartburn.    . ergocalciferol (VITAMIN D2) 50000 units capsule Take 1 capsule (50,000 Units total) by mouth once a week. 12 capsule 1  . omeprazole (PRILOSEC) 20 MG capsule Take 20 mg by mouth daily.     No current facility-administered medications for this visit.    Allergies:  Ace inhibitors; Penicillins; and Sulfonamide derivatives   Social History: The patient  reports that she has never smoked. She has never used smokeless tobacco. She reports that she drinks alcohol. She reports that she does not use drugs.   Family History: The patient's family history includes Cancer in her maternal  grandmother; Heart disease in her father; Hypertension in her mother; Stroke in her mother.   ROS:  Please see the history of present illness. Otherwise, complete review of systems is positive for none.  All other systems are reviewed and negative.   Physical Exam: VS:  BP 122/78 (BP Location: Right Arm)   Pulse 85   Ht 5\' 2"  (1.575 m)   Wt 187 lb (84.8 kg)   SpO2 96%   BMI 34.20 kg/m , BMI Body mass index is 34.2 kg/m.  Wt Readings from Last 3 Encounters:  06/04/17 187 lb (84.8 kg)  05/21/17 170 lb  (77.1 kg)  04/16/17 188 lb (85.3 kg)    General: Overweight woman, appears comfortable at rest. HEENT: Conjunctiva and lids normal, oropharynx clear. Neck: Supple, no elevated JVP or carotid bruits, no thyromegaly. Lungs: Clear to auscultation, nonlabored breathing at rest. Cardiac: Regular rate and rhythm, no S3, soft systolic murmur, no pericardial rub. Abdomen: Soft, nontender, bowel sounds present, no guarding or rebound. Extremities: No pitting edema, distal pulses 2+. Skin: Warm and dry. Musculoskeletal: No kyphosis. Neuropsychiatric: Alert and oriented x3, affect grossly appropriate.  ECG: I personally reviewed the tracing from 04/16/2017 which showed sinus rhythm with nonspecific T-wave changes.  Recent Labwork: 04/08/2017: ALT 15; AST 17; BUN 15; Creat 0.82; Potassium 4.6; Sodium 140 04/30/2017: Hemoglobin 13.5; Platelets 337; TSH 1.18     Component Value Date/Time   CHOL 213 (H) 04/08/2017 0727   TRIG 128 04/08/2017 0727   HDL 44 (L) 04/08/2017 0727   CHOLHDL 4.8 04/08/2017 0727   VLDL 26 04/08/2017 0727   LDLCALC 143 (H) 04/08/2017 0727    Assessment and Plan:  1. Atypical chest pain, description very consistent with reflux. She reports improvement with antacids. No exertional component. Would recommend continued efforts at dietary modifications, uses antacids as needed. If symptoms persist could consider further gastroenterology evaluation.  2. Essential hypertension, on Norvasc. Blood pressure control is adequate today. ECG is overall nonspecific.  3. Heart murmur on examination, suspect benign although she has not had an echocardiogram previously. Echocardiogram will be obtained.  4. Hyperlipidemia, managing via diet at this time. Recent LDL 143. She follows with Dr. Moshe Cipro.  Current medicines were reviewed with the patient today.   Orders Placed This Encounter  Procedures  . ECHOCARDIOGRAM COMPLETE    Disposition: Call with test results.  Signed, Satira Sark, MD, [facility] 06/04/2017 11:33 AM    [place] [facility] [place]. [address], [place],  [address] Phone: [phone]; Fax: [phone]

## 2017-06-04 NOTE — Patient Instructions (Signed)
Medication Instructions:  Your physician recommends that you continue on your current medications as directed. Please refer to the Current Medication list given to you today.   Labwork: none  Testing/Procedures: Your physician has requested that you have an echocardiogram. Echocardiography is a painless test that uses sound waves to create images of your heart. It provides your doctor with information about the size and shape of your heart and how well your heart's chambers and valves are working. This procedure takes approximately one hour. There are no restrictions for this procedure.    Follow-Up: Your physician recommends that you schedule a follow-up appointment in: to be determined, we will call with test results    Any Other Special Instructions Will Be Listed Below (If Applicable).     If you need a refill on your cardiac medications before your next appointment, please call your pharmacy.

## 2017-06-07 ENCOUNTER — Other Ambulatory Visit: Payer: Self-pay | Admitting: Family Medicine

## 2017-06-09 ENCOUNTER — Ambulatory Visit (HOSPITAL_COMMUNITY)
Admission: RE | Admit: 2017-06-09 | Discharge: 2017-06-09 | Disposition: A | Discharge status: Home / Self Care | Payer: Medicare HMO | Source: Ambulatory Visit | Attending: Cardiology | Admitting: Cardiology

## 2017-06-09 DIAGNOSIS — R011 Cardiac murmur, unspecified: Secondary | ICD-10-CM | POA: Insufficient documentation

## 2017-06-09 DIAGNOSIS — I358 Other nonrheumatic aortic valve disorders: Secondary | ICD-10-CM | POA: Diagnosis not present

## 2017-06-09 DIAGNOSIS — I209 Angina pectoris, unspecified: Secondary | ICD-10-CM | POA: Insufficient documentation

## 2017-06-09 DIAGNOSIS — E785 Hyperlipidemia, unspecified: Secondary | ICD-10-CM | POA: Insufficient documentation

## 2017-06-09 DIAGNOSIS — I119 Hypertensive heart disease without heart failure: Secondary | ICD-10-CM | POA: Insufficient documentation

## 2017-06-09 NOTE — Progress Notes (Signed)
*  PRELIMINARY RESULTS* Echocardiogram 2D Echocardiogram has been performed.  Leavy Cella 06/09/2017, 1:52 PM

## 2017-06-09 NOTE — Telephone Encounter (Signed)
Seen 8 [phone] 18

## 2017-07-02 DIAGNOSIS — D225 Melanocytic nevi of trunk: Secondary | ICD-10-CM | POA: Diagnosis not present

## 2017-07-02 DIAGNOSIS — L820 Inflamed seborrheic keratosis: Secondary | ICD-10-CM | POA: Diagnosis not present

## 2017-07-02 DIAGNOSIS — L570 Actinic keratosis: Secondary | ICD-10-CM | POA: Diagnosis not present

## 2017-07-02 DIAGNOSIS — X32XXXD Exposure to sunlight, subsequent encounter: Secondary | ICD-10-CM | POA: Diagnosis not present

## 2017-07-02 DIAGNOSIS — Z1283 Encounter for screening for malignant neoplasm of skin: Secondary | ICD-10-CM | POA: Diagnosis not present

## 2017-08-07 ENCOUNTER — Encounter: Payer: Self-pay | Admitting: Family Medicine

## 2017-08-07 ENCOUNTER — Ambulatory Visit (INDEPENDENT_AMBULATORY_CARE_PROVIDER_SITE_OTHER): Payer: Medicare HMO | Admitting: Family Medicine

## 2017-08-07 VITALS — BP 120/78 | HR 84 | Resp 16 | Ht 62.0 in | Wt 186.1 lb

## 2017-08-07 DIAGNOSIS — E559 Vitamin D deficiency, unspecified: Secondary | ICD-10-CM

## 2017-08-07 DIAGNOSIS — Z0000 Encounter for general adult medical examination without abnormal findings: Secondary | ICD-10-CM | POA: Diagnosis not present

## 2017-08-07 DIAGNOSIS — I10 Essential (primary) hypertension: Secondary | ICD-10-CM

## 2017-08-07 DIAGNOSIS — E785 Hyperlipidemia, unspecified: Secondary | ICD-10-CM

## 2017-08-07 NOTE — Progress Notes (Signed)
Preventive Screening-Counseling & Management   Patient present here today for a Medicare annual wellness visit.   Current Problems (verified)   Medications Prior to Visit Allergies (verified)   PAST HISTORY  Family History (verified)   Social History Married for 50 years this year. 2 sons, Retired from [employer]. Never a smoker    Risk Factors  Current exercise habits:  Goes to [facility] [employer] 3x a week- water aerobics   Dietary issues discussed: low fat low carb/sugar, heart healthy encouraged    Cardiac risk factors: Hypertension and hyperlipidemia, also obesity and inadequate exercise  Depression Screen  (Note: if answer to either of the following is "Yes", a more complete depression screening is indicated)   Over the past two weeks, have you felt down, depressed or hopeless? No  Over the past two weeks, have you felt little interest or pleasure in doing things? No  Have you lost interest or pleasure in daily life? No  Do you often feel hopeless? No  Do you cry easily over simple problems? No   Activities of Daily Living  In your present state of health, do you have any difficulty performing the following activities?  Driving?: No Managing money?: No Feeding yourself?:No Getting from bed to chair?:No Climbing a flight of stairs?: has bilateral hip pain but can still climb stairs  Preparing food and eating?:No Bathing or showering?:No Getting dressed?:No Getting to the toilet?:No Using the toilet?:No Moving around from place to place?: no   Fall Risk Assessment In the past year have you fallen or had a near fall?:No Are you currently taking any medications that make you dizzy?:No   Hearing Difficulties: No Do you often ask people to speak up or repeat themselves?: sometimes Do you experience ringing or noises in your ears?:No Do you have difficulty understanding soft or whispered voices?:No  Cognitive Testing  Alert? Yes Normal Appearance?Yes  Oriented to person?  Yes Place? Yes  Time? Yes  Displays appropriate judgment?Yes  Can read the correct time from a watch face? yes Are you having problems remembering things?No  Advanced Directives have been discussed with the patient?Yes, will give information    List the Names of Other Physician/Practitioners you currently use:  [facility] (cardiology)    Indicate any recent Medical Services you may have received from other than [facility] providers in the past year (date may be approximate).     Medicare Attestation  I have personally reviewed:  The patient's medical and social history  Their use of alcohol, tobacco or illicit drugs  Their current medications and supplements  The patient's functional ability including ADLs,fall risks, home safety risks, cognitive, and hearing and visual impairment  Diet and physical activities  Evidence for depression or mood disorders  The patient's weight, height, BMI, and visual acuity have been recorded in the chart. I have made referrals, counseling, and provided education to the patient based on review of the above and I have provided the patient with a written personalized care plan for preventive services.    Physical Exam BP 120/78   Pulse 84   Resp 16   Ht 5\' 2"  (1.575 m)   Wt 186 lb 1.9 oz (84.4 kg)   SpO2 97%   BMI 34.04 kg/m     Assessment & Plan:  Medicare annual wellness visit, subsequent Annual exam as documented. Counseling done  re healthy lifestyle involving commitment to 150 minutes exercise per week, heart healthy diet, and attaining healthy weight.The importance of adequate sleep also discussed.  Regular seat belt use and home safety, is also discussed. Changes in health habits are decided on by the patient with goals and time frames  set for achieving them. Immunization and cancer screening needs are specifically addressed at this visit.

## 2017-08-07 NOTE — Patient Instructions (Signed)
F/U in 6 months, call if you need me before    Fasting lipid, chemn 7 and vit D 5 moths and 3 weeks  Listen to your body and plan to be healthier over time  If you get a cut , please call and you can come for TdAP  Info will be provided on l;iving will  It is important that you exercise regularly at least 30 minutes 6 times a week. If you develop chest pain, have severe difficulty breathing, or feel very tired, stop exercising immediately and seek medical attention

## 2017-08-09 ENCOUNTER — Encounter: Payer: Self-pay | Admitting: Family Medicine

## 2017-08-09 NOTE — Assessment & Plan Note (Signed)

## 2017-10-07 ENCOUNTER — Ambulatory Visit (INDEPENDENT_AMBULATORY_CARE_PROVIDER_SITE_OTHER): Payer: Medicare HMO

## 2017-10-07 ENCOUNTER — Encounter: Payer: Self-pay | Admitting: Family Medicine

## 2017-10-07 DIAGNOSIS — N3001 Acute cystitis with hematuria: Secondary | ICD-10-CM | POA: Diagnosis not present

## 2017-10-07 LAB — POCT URINALYSIS DIPSTICK
Bilirubin, UA: NEGATIVE
Glucose, UA: NEGATIVE
Ketones, UA: NEGATIVE
NITRITE UA: POSITIVE
PH UA: 5 (ref 5.0–8.0)
PROTEIN UA: 30
SPEC GRAV UA: 1.015 (ref 1.010–1.025)
UROBILINOGEN UA: 0.2 U/dL

## 2017-10-07 MED ORDER — CIPROFLOXACIN HCL 500 MG PO TABS: 500.0000 mg | ORAL_TABLET | Freq: Two times a day (BID) | ORAL | 0 refills | Status: DC

## 2017-10-07 NOTE — Progress Notes (Signed)
UA positive for nitrates and leukocytes. Will send for culture and 3 days of cipro prescribed per standing order

## 2017-10-08 DIAGNOSIS — N3001 Acute cystitis with hematuria: Secondary | ICD-10-CM | POA: Diagnosis not present

## 2017-10-10 ENCOUNTER — Telehealth: Payer: Self-pay | Admitting: Family Medicine

## 2017-10-10 LAB — URINE CULTURE
MICRO NUMBER: 90194482
SPECIMEN QUALITY:: ADEQUATE

## 2017-10-10 NOTE — Telephone Encounter (Signed)
Switch pharmacy to TO [facility]

## 2017-10-13 NOTE — Telephone Encounter (Signed)
Done

## 2017-12-31 ENCOUNTER — Other Ambulatory Visit: Payer: Self-pay | Admitting: Family Medicine

## 2018-01-13 DIAGNOSIS — Z1283 Encounter for screening for malignant neoplasm of skin: Secondary | ICD-10-CM | POA: Diagnosis not present

## 2018-01-13 DIAGNOSIS — R208 Other disturbances of skin sensation: Secondary | ICD-10-CM | POA: Diagnosis not present

## 2018-01-13 DIAGNOSIS — D225 Melanocytic nevi of trunk: Secondary | ICD-10-CM | POA: Diagnosis not present

## 2018-01-13 DIAGNOSIS — D485 Neoplasm of uncertain behavior of skin: Secondary | ICD-10-CM | POA: Diagnosis not present

## 2018-01-13 DIAGNOSIS — L821 Other seborrheic keratosis: Secondary | ICD-10-CM | POA: Diagnosis not present

## 2018-01-27 ENCOUNTER — Encounter: Payer: Self-pay | Admitting: Orthopaedic Surgery

## 2018-01-27 ENCOUNTER — Ambulatory Visit (INDEPENDENT_AMBULATORY_CARE_PROVIDER_SITE_OTHER): Payer: Medicare HMO

## 2018-01-27 ENCOUNTER — Ambulatory Visit (INDEPENDENT_AMBULATORY_CARE_PROVIDER_SITE_OTHER): Payer: Medicare HMO | Admitting: Orthopaedic Surgery

## 2018-01-27 VITALS — BP 124/75 | HR 83 | Temp 98.6°F | Ht 62.0 in | Wt 186.0 lb

## 2018-01-27 DIAGNOSIS — S92352A Displaced fracture of fifth metatarsal bone, left foot, initial encounter for closed fracture: Secondary | ICD-10-CM

## 2018-01-27 DIAGNOSIS — M25572 Pain in left ankle and joints of left foot: Secondary | ICD-10-CM

## 2018-01-27 NOTE — Progress Notes (Signed)
Patient Yvonne Brewer, female DOB:1946-02-12, 72 y.o. QPY:[id number]  Chief Complaint  Patient presents with  . Foot Pain    Left foot pain, no recent injury.    HPI  Yvonne Brewer is a 72 y.o. female who has had left foot pain for about six weeks or so.  She was sitting at her computer working.  She got up and took a step.  Her foot on the left was "asleep" and she fell and twisted it. She had some pain.  She used ice and elevated it.  She has been wearing thick sandals.  The foot has some lateral swelling but no redness.  As it has been bothering her a while and not getting better, she decided to have it checked.  She has had surgery on the left foot in the past.  HPI  Body mass index is 34.02 kg/m.  ROS  Review of Systems  Musculoskeletal: Positive for arthralgias, gait problem and joint swelling.  All other systems reviewed and are negative.   Past Medical History:  Diagnosis Date  . Essential hypertension 2015  . Female bladder prolapse   . History of cystitis   . History of skin cancer   . Hyperlipidemia   . Obesity     Past Surgical History:  Procedure Laterality Date  . CATARACT EXTRACTION  2012   bilateral   . COLONOSCOPY  11/06/2011   Procedure: COLONOSCOPY;  Surgeon: Rogene Houston, MD;  Location: [facility];  Service: Endoscopy;  Laterality: N/A;  7:30  . COLONOSCOPY N/A 05/21/2017   Procedure: COLONOSCOPY;  Surgeon: Rogene Houston, MD;  Location: [facility];  Service: Endoscopy;  Laterality: N/A;  730-moved to 9/26 @10 :30am per Lelon Frohlich  . EYE SURGERY  2011   bilateral cataract with implants  . HAMMER TOE SURGERY Left 01/2016  . Left bunionectomy    . Squamous cell ca rt ant chest 1989    . YAG LASER APPLICATION Left 09/19/5807   Procedure: YAG LASER APPLICATION;  Surgeon: Rutherford Guys, MD;  Location: [facility];  Service: Ophthalmology;  Laterality: Left;  . YAG LASER APPLICATION Right 9/83/3825   Procedure: YAG LASER APPLICATION;  Surgeon: Rutherford Guys, MD;  Location: [facility];  Service: Ophthalmology;  Laterality: Right;    Family History  Problem Relation Age of Onset  . Stroke Mother   . Hypertension Mother   . Heart disease Father   . Cancer Maternal Grandmother   . Anesthesia problems Neg Hx   . Hypotension Neg Hx   . Malignant hyperthermia Neg Hx   . Pseudochol deficiency Neg Hx     Social History Social History   Tobacco Use  . Smoking status: Never Smoker  . Smokeless tobacco: Never Used  Substance Use Topics  . Alcohol use: Yes    Comment: glass of red wine most nights or scotch and water  . Drug use: No    Allergies  Allergen Reactions  . Ace Inhibitors Cough    Cough  . Penicillins Hives    Has patient had a PCN reaction causing immediate rash, facial/tongue/throat swelling, SOB or lightheadedness with hypotension: No Has patient had a PCN reaction causing severe rash involving mucus membranes or skin necrosis: No Has patient had a PCN reaction that required hospitalization No Has patient had a PCN reaction occurring within the last 10 years: No If all of the above answers are "NO", then may proceed with Cephalosporin use.   . Sulfonamide Derivatives Hives  Current Outpatient Medications  Medication Sig Dispense Refill  . amLODipine (NORVASC) 5 MG tablet TAKE 1 TABLET BY MOUTH EVERY DAY 90 tablet 1  . cholecalciferol (VITAMIN D) 1000 units tablet Take 1,000 Units by mouth daily.    . ciprofloxacin (CIPRO) 500 MG tablet Take 1 tablet (500 mg total) by mouth 2 (two) times daily. 6 tablet 0  . omeprazole (PRILOSEC) 20 MG capsule Take 20 mg by mouth daily.    . Turmeric 500 MG CAPS Take 1 capsule by mouth daily.     No current facility-administered medications for this visit.      Physical Exam  Blood pressure 124/75, pulse 83, temperature 98.6 F (37 C), height 5\' 2"  (1.575 m), weight 186 lb (84.4 kg).  Constitutional: overall normal hygiene, normal nutrition, well developed, normal  grooming, normal body habitus. Assistive device:none  Musculoskeletal: gait and station Limp left, muscle tone and strength are normal, no tremors or atrophy is present.  .  Neurological: coordination overall normal.  Deep tendon reflex/nerve stretch intact.  Sensation normal.  Cranial nerves II-XII intact.   Skin:   Normal overall no scars, lesions, ulcers or rashes. No psoriasis.  Psychiatric: Alert and oriented x 3.  Recent memory intact, remote memory unclear.  Normal mood and affect. Well groomed.  Good eye contact.  Cardiovascular: overall no swelling, no varicosities, no edema bilaterally, normal temperatures of the legs and arms, no clubbing, cyanosis and good capillary refill.  Lymphatic: palpation is normal.  Left foot with healed scar over the first metatarsal medially, scar from surgery of the second toe, pain and swelling at the base of the fifth metatarsal, no redness, NV intact, ROM of ankle full.  Slight left limp.  All other systems reviewed and are negative   The patient has been educated about the nature of the problem(s) and counseled on treatment options.  The patient appeared to understand what I have discussed and is in agreement with it.  X-rays were done of the left foot, reported separately.  Encounter Diagnoses  Name Primary?  . Pain of joint of left ankle and foot Yes  . Closed fracture of base of fifth metatarsal bone of left foot, initial encounter     PLAN Call if any problems.  Precautions discussed.  Continue current medications.   She has a healing fracture of the base of the left fifth metatarsal.  By history it is about four to six weeks old which is in line what the x-rays appear.  I have advised her to continue the thick soled sandals.  She needs to avoid loose ground.  It should slowly heal.  If it gets worse, I can repeat x-rays.  Return to clinic prn   Electronically Signed Sanjuana Kava, MD 6/4/20193:48 PM

## 2018-02-03 ENCOUNTER — Telehealth: Payer: Self-pay | Admitting: Radiology

## 2018-02-03 NOTE — Telephone Encounter (Signed)
My chart message sent to patient, she has emailed a request for correction of xray report.

## 2018-02-04 ENCOUNTER — Telehealth: Payer: Self-pay | Admitting: Radiology

## 2018-02-04 NOTE — Telephone Encounter (Signed)
Mychart message sent to patient.

## 2018-02-05 ENCOUNTER — Encounter: Payer: Self-pay | Admitting: Family Medicine

## 2018-02-05 ENCOUNTER — Ambulatory Visit (INDEPENDENT_AMBULATORY_CARE_PROVIDER_SITE_OTHER): Payer: Medicare HMO | Admitting: Family Medicine

## 2018-02-05 VITALS — BP 138/86 | HR 86 | Resp 16 | Ht 62.0 in | Wt 188.0 lb

## 2018-02-05 DIAGNOSIS — Z1231 Encounter for screening mammogram for malignant neoplasm of breast: Secondary | ICD-10-CM | POA: Diagnosis not present

## 2018-02-05 DIAGNOSIS — E785 Hyperlipidemia, unspecified: Secondary | ICD-10-CM

## 2018-02-05 DIAGNOSIS — S92902S Unspecified fracture of left foot, sequela: Secondary | ICD-10-CM

## 2018-02-05 DIAGNOSIS — E669 Obesity, unspecified: Secondary | ICD-10-CM

## 2018-02-05 DIAGNOSIS — Z780 Asymptomatic menopausal state: Secondary | ICD-10-CM

## 2018-02-05 DIAGNOSIS — I10 Essential (primary) hypertension: Secondary | ICD-10-CM

## 2018-02-05 DIAGNOSIS — E66811 Obesity, class 1: Secondary | ICD-10-CM

## 2018-02-05 NOTE — Patient Instructions (Addendum)
Physical exam in early September 1 1 or after call if you need me sooner   Please schedule patient.'s mammogram and bone densirty test at checkout  You will need fasting labs 1 week before your September visit  CBC, lipid, cmp and eGFR , tSH and vit D sept 7 or after  You are being referred to the nutritionist   Please change food choice and portion size to facilitate weight loss  It is important that you exercise regularly at least 30 minutes 5 times a week. If you develop chest pain, have severe difficulty breathing, or feel very tired, stop exercising immediately and seek medical attention  Thank you  for choosing [facility]. We consider it a privelige to serve you.  Delivering excellent health care in a caring and  compassionate way is our goal.  Partnering with you,  so that together we can achieve this goal is our strategy.    

## 2018-02-09 ENCOUNTER — Encounter: Payer: Self-pay | Admitting: Family Medicine

## 2018-02-09 DIAGNOSIS — S92902S Unspecified fracture of left foot, sequela: Secondary | ICD-10-CM | POA: Insufficient documentation

## 2018-02-09 NOTE — Assessment & Plan Note (Signed)
still experiencing pain with direct pressure on base of 5th toe where she sustained a fracture. Repeat dexa, and pt encouraged strongly and educated re the need to start a bone builder in light of fracture history, she is considering this

## 2018-02-09 NOTE — Assessment & Plan Note (Signed)
Hyperlipidemia:Low fat diet discussed and encouraged.   Lipid Panel  Lab Results  Component Value Date   CHOL 213 (H) 04/08/2017   HDL 44 (L) 04/08/2017   LDLCALC 143 (H) 04/08/2017   TRIG 128 04/08/2017   CHOLHDL 4.8 04/08/2017   Updated lab needed at/ before next visit.

## 2018-02-09 NOTE — Assessment & Plan Note (Signed)
Deteriorated. Patient re-educated about  the importance of commitment to a  minimum of 150 minutes of exercise per week.  The importance of healthy food choices with portion control discussed. Encouraged to start a food diary, count calories and to consider  joining a support group. Sample diet sheets offered. Goals set by the patient for the next several months.   Weight /BMI 02/05/2018 01/27/2018 08/07/2017  WEIGHT 188 lb 186 lb 186 lb 1.9 oz  HEIGHT 5\' 2"  5\' 2"  5\' 2"   BMI 34.39 kg/m2 34.02 kg/m2 34.04 kg/m2   Refer to educator, motivated to lose weight and will benefit

## 2018-02-09 NOTE — Progress Notes (Signed)
Yvonne Brewer     MRN: [id number]      DOB: [birth year]   HPI Yvonne Brewer is here for follow up and re-evaluation of chronic medical conditions, medication management and review of any available recent lab and radiology data.  Preventive health is updated, specifically  Cancer screening and Immunization.   Concerned about weight  Gain and how difficult it is for her to lose the weight that she needs to and wants to very motivate and is happy to see nutritionist Yvonne Brewer and sustained a fracture  To the base of her left 5th toe a few months ago, still a bit resistant to starting the boone builder indicated, esp in light oif her uncontrolled GERD and potential for adverse s/e , however will go ahead with her rept dexa which is indicated  ROS Denies recent fever or chills. Denies sinus pressure, nasal congestion, ear pain or sore throat. Denies chest congestion, productive cough or wheezing. Denies chest pains, palpitations and leg swelling Still c/o intermittent epigastric discomfort and nausea alternates PPI with H2 blocker nausea, vomiting,diarrhea or constipation.   Denies dysuria, frequency, hesitancy does have some  incontinence. . Denies headaches, seizures, numbness, or tingling. Denies depression, anxiety or insomnia. Denies skin break down or rash.   PE  BP 138/86   Pulse 86   Resp 16   Ht 5\' 2"  (1.575 m)   Wt 188 lb (85.3 kg)   SpO2 99%   BMI 34.39 kg/m   Patient alert and oriented and in no cardiopulmonary distress.  HEENT: No facial asymmetry, EOMI,   oropharynx pink and moist.  Neck supple no JVD, no mass.  Chest: Clear to auscultation bilaterally.  CVS: S1, S2 no murmurs, no S3.Regular rate.  ABD: Soft non tender.   Ext: No edema  MS: Adequate though reduced ROM spine, shoulders, hips and knees.Tender to palpation over base of 5th left toe  Skin: Intact, no ulcerations or rash noted.  Psych: Good eye contact, normal affect. Memory intact not anxious or  depressed appearing.  CNS: CN 2-12 intact, power,  normal throughout.no focal deficits noted.   Assessment & Plan  Essential hypertension Not at goal DASH diet and commitment to daily physical activity for a minimum of 30 minutes discussed and encouraged, as a part of hypertension management. The importance of attaining a healthy weight is also discussed.  BP/Weight 02/05/2018 01/27/2018 08/07/2017 06/04/2017 05/21/2017 04/16/2017 0/53/9767  Systolic BP 341 937 902 409 735 329 924  Diastolic BP 86 75 78 78 67 82 82  Wt. (Lbs) 188 186 186.12 187 170 188 -  BMI 34.39 34.02 34.04 34.2 31.09 34.39 -   No med change    Hyperlipidemia LDL goal <100 Hyperlipidemia:Low fat diet discussed and encouraged.   Lipid Panel  Lab Results  Component Value Date   CHOL 213 (H) 04/08/2017   HDL 44 (L) 04/08/2017   LDLCALC 143 (H) 04/08/2017   TRIG 128 04/08/2017   CHOLHDL 4.8 04/08/2017   Updated lab needed at/ before next visit.     Obesity (BMI 30.0-34.9) Deteriorated. Patient re-educated about  the importance of commitment to a  minimum of 150 minutes of exercise per week.  The importance of healthy food choices with portion control discussed. Encouraged to start a food diary, count calories and to consider  joining a support group. Sample diet sheets offered. Goals set by the patient for the next several months.   Weight /BMI 02/05/2018 01/27/2018 08/07/2017  WEIGHT 188 lb  186 lb 186 lb 1.9 oz  HEIGHT 5\' 2"  5\' 2"  5\' 2"   BMI 34.39 kg/m2 34.02 kg/m2 34.04 kg/m2   Refer to educator, motivated to lose weight and will benefit   Fracture, foot, left, sequela still experiencing pain with direct pressure on base of 5th toe where she sustained a fracture. Repeat dexa, and pt encouraged strongly and educated re the need to start a bone builder in light of fracture history, she is considering this

## 2018-02-09 NOTE — Assessment & Plan Note (Signed)
Not at goal DASH diet and commitment to daily physical activity for a minimum of 30 minutes discussed and encouraged, as a part of hypertension management. The importance of attaining a healthy weight is also discussed.  BP/Weight 02/05/2018 01/27/2018 08/07/2017 06/04/2017 05/21/2017 04/16/2017 6/59/9357  Systolic BP 017 793 903 009 233 007 622  Diastolic BP 86 75 78 78 67 82 82  Wt. (Lbs) 188 186 186.12 187 170 188 -  BMI 34.39 34.02 34.04 34.2 31.09 34.39 -   No med change

## 2018-02-13 ENCOUNTER — Telehealth: Payer: Self-pay

## 2018-02-13 DIAGNOSIS — E785 Hyperlipidemia, unspecified: Secondary | ICD-10-CM

## 2018-02-13 DIAGNOSIS — I10 Essential (primary) hypertension: Secondary | ICD-10-CM

## 2018-02-13 DIAGNOSIS — E669 Obesity, unspecified: Secondary | ICD-10-CM

## 2018-02-13 NOTE — Telephone Encounter (Signed)
Sent to the nutritionist

## 2018-02-13 NOTE — Telephone Encounter (Signed)
-----   Message from Fayrene Helper, MD sent at 02/09/2018  1:23 AM EDT ----- Regarding: pls refer to diabetic educator for obesity and hyperlipidemia  and hTN  Should get in on scholarship program

## 2018-03-04 ENCOUNTER — Telehealth: Payer: Self-pay | Admitting: Family Medicine

## 2018-03-04 NOTE — Telephone Encounter (Signed)
I called pt to sch AWV-and she scheduled, however wants a sleep aid called in she is going on a 17 hour flight--she said she mentioned it to Dr Moshe Cipro prior.

## 2018-03-05 ENCOUNTER — Other Ambulatory Visit: Payer: Self-pay | Admitting: Family Medicine

## 2018-03-05 MED ORDER — TRAZODONE HCL 50 MG PO TABS: ORAL_TABLET | ORAL | 0 refills | Status: DC

## 2018-03-05 NOTE — Progress Notes (Unsigned)
lorazepam

## 2018-03-05 NOTE — Telephone Encounter (Signed)
Trazodone 50 mg # 4 tablets is prescribed , and patient is aware

## 2018-03-10 DIAGNOSIS — R69 Illness, unspecified: Secondary | ICD-10-CM | POA: Diagnosis not present

## 2018-03-11 ENCOUNTER — Ambulatory Visit (INDEPENDENT_AMBULATORY_CARE_PROVIDER_SITE_OTHER): Payer: BC Managed Care – PPO | Admitting: Internal Medicine

## 2018-04-09 ENCOUNTER — Encounter: Payer: Medicare HMO | Attending: Family Medicine | Admitting: Nutrition

## 2018-04-09 VITALS — Ht 62.0 in | Wt 187.0 lb

## 2018-04-09 DIAGNOSIS — Z713 Dietary counseling and surveillance: Secondary | ICD-10-CM | POA: Diagnosis present

## 2018-04-09 DIAGNOSIS — E669 Obesity, unspecified: Secondary | ICD-10-CM

## 2018-04-09 DIAGNOSIS — E785 Hyperlipidemia, unspecified: Secondary | ICD-10-CM | POA: Insufficient documentation

## 2018-04-09 DIAGNOSIS — Z6830 Body mass index (BMI) 30.0-30.9, adult: Secondary | ICD-10-CM | POA: Diagnosis not present

## 2018-04-09 DIAGNOSIS — I10 Essential (primary) hypertension: Secondary | ICD-10-CM | POA: Diagnosis not present

## 2018-04-09 NOTE — Progress Notes (Signed)
  Medical Nutrition Therapy:  Appt start time: 3790 end time:  1630.   Assessment:  Primary concerns today: Overweight. LIves with her husband. She does the cooking and shopping. Marland Kitchen  PHysical actviity. Goes to [employer] three times per week.  Does garden work. Wants to lose weight. Trying to cut out snacking and eat healthier. Current diet is excessive to meet her needs due to BMI > 30.  Lab Results  Component Value Date   HGBA1C 5.3 04/08/2017   CMP Latest Ref Rng & Units 04/08/2017 02/20/2016 07/27/2015  Glucose 65 - 99 mg/dL 92 103(H) 89  BUN 7 - 25 mg/dL 15 22 14   Creatinine 0.60 - 0.93 mg/dL 0.82 0.89 0.75  Sodium 135 - 146 mmol/L 140 140 139  Potassium 3.5 - 5.3 mmol/L 4.6 4.7 4.4  Chloride 98 - 110 mmol/L 105 102 105  CO2 20 - 32 mmol/L 27 26 25   Calcium 8.6 - 10.4 mg/dL 8.9 9.2 9.2  Total Protein 6.1 - 8.1 g/dL 6.3 - -  Total Bilirubin 0.2 - 1.2 mg/dL 0.5 - -  Alkaline Phos 33 - 130 U/L 56 - -  AST 10 - 35 U/L 17 - -  ALT 6 - 29 U/L 15 - -     Preferred Learning Style:   No preference indicated   Learning Readiness:   Ready  Change in progress   MEDICATIONS:   DIETARY INTAKE:  24-hr recall:  B ( AM): Shredded wheat cereal, almond milk and blueberries or eggs and bacon and toast, coffee Snk ( AM):   L ( PM): Grileld chicken salad and ranch dressing,  OR sandwich on  mulitgrain bread,  Water, sf unsweet tea Snk ( PM):  D ( PM): PIzza store bought, cheese thin crust, 2 pieces, water Zucchini bread Beverages: water, usnweet tea  Usual physical activity: wateraerobics  Estimated energy needs: 1200  calories 135 g carbohydrates 90 g protein 33 g fat  Progress Towards Goal(s):  In progress.   Nutritional Diagnosis:  NB-1.1 Food and nutrition-related knowledge deficit As related to Obsity  As evidenced by BMI > 30.  Interventiion:  Nutrition and Diabetes education provided on My Plate, CHO counting, meal planning, portion sizes, timing of meals,  avoiding snacks between meals  taking medications as prescribed, benefits of exercising 30 minutes per day and prevention of DM. Marland Kitchen Goals 1. Follow MY Plate 2. Exercise 2-3 times per week 3. Cut back on fat and be more aware of portions 4. Lose 1 lb per week   Teaching Method Utilized:  Visual Auditory Hands on  Handouts given during visit include:  The Plate Method   Meal Plan Card  Barriers to learning/adherence to lifestyle change: none  Demonstrated degree of understanding via:  Teach Back   Monitoring/Evaluation:  Dietary intake, exercise, , and body weight in 1 month(s).

## 2018-04-09 NOTE — Patient Instructions (Signed)
Goals 1. Follow MY Plate 2. Exercise 2-3 times per week 3. Cut back on fat and be more aware of portions 4. Lose 1 lb per week

## 2018-04-16 ENCOUNTER — Encounter: Payer: Self-pay | Admitting: Family Medicine

## 2018-04-16 ENCOUNTER — Ambulatory Visit (HOSPITAL_COMMUNITY)
Admission: RE | Admit: 2018-04-16 | Discharge: 2018-04-16 | Disposition: A | Discharge status: Home / Self Care | Payer: Medicare HMO | Source: Ambulatory Visit | Attending: Family Medicine | Admitting: Family Medicine

## 2018-04-16 DIAGNOSIS — Z1231 Encounter for screening mammogram for malignant neoplasm of breast: Secondary | ICD-10-CM | POA: Diagnosis not present

## 2018-04-16 DIAGNOSIS — Z780 Asymptomatic menopausal state: Secondary | ICD-10-CM

## 2018-04-16 DIAGNOSIS — Z13820 Encounter for screening for osteoporosis: Secondary | ICD-10-CM | POA: Diagnosis not present

## 2018-04-22 ENCOUNTER — Encounter: Payer: Self-pay | Admitting: Nutrition

## 2018-05-07 ENCOUNTER — Encounter: Payer: BC Managed Care – PPO | Admitting: Family Medicine

## 2018-06-09 ENCOUNTER — Telehealth: Payer: Self-pay

## 2018-06-09 DIAGNOSIS — E785 Hyperlipidemia, unspecified: Secondary | ICD-10-CM

## 2018-06-09 DIAGNOSIS — E559 Vitamin D deficiency, unspecified: Secondary | ICD-10-CM

## 2018-06-09 DIAGNOSIS — I10 Essential (primary) hypertension: Secondary | ICD-10-CM

## 2018-06-09 NOTE — Telephone Encounter (Signed)
Labs ordered.

## 2018-06-11 DIAGNOSIS — E785 Hyperlipidemia, unspecified: Secondary | ICD-10-CM | POA: Diagnosis not present

## 2018-06-11 DIAGNOSIS — E559 Vitamin D deficiency, unspecified: Secondary | ICD-10-CM | POA: Diagnosis not present

## 2018-06-11 DIAGNOSIS — I10 Essential (primary) hypertension: Secondary | ICD-10-CM | POA: Diagnosis not present

## 2018-06-12 LAB — CBC
HCT: 41.6 % (ref 35.0–45.0)
HEMOGLOBIN: 13.9 g/dL (ref 11.7–15.5)
MCH: 31 pg (ref 27.0–33.0)
MCHC: 33.4 g/dL (ref 32.0–36.0)
MCV: 92.9 fL (ref 80.0–100.0)
MPV: 10.5 fL (ref 7.5–12.5)
Platelets: 322 10*3/uL (ref 140–400)
RBC: 4.48 10*6/uL (ref 3.80–5.10)
RDW: 11.8 % (ref 11.0–15.0)
WBC: 5.7 10*3/uL (ref 3.8–10.8)

## 2018-06-12 LAB — COMPLETE METABOLIC PANEL WITH GFR
AG Ratio: 1.7 (calc) (ref 1.0–2.5)
ALKALINE PHOSPHATASE (APISO): 59 U/L (ref 33–130)
ALT: 13 U/L (ref 6–29)
AST: 17 U/L (ref 10–35)
Albumin: 4.3 g/dL (ref 3.6–5.1)
BILIRUBIN TOTAL: 0.5 mg/dL (ref 0.2–1.2)
BUN: 14 mg/dL (ref 7–25)
CHLORIDE: 102 mmol/L (ref 98–110)
CO2: 28 mmol/L (ref 20–32)
CREATININE: 0.83 mg/dL (ref 0.60–0.93)
Calcium: 9.8 mg/dL (ref 8.6–10.4)
GFR, Est African American: 82 mL/min/{1.73_m2} (ref >=60)
GFR, Est Non African American: 70 mL/min/{1.73_m2} (ref >=60)
GLOBULIN: 2.5 g/dL (ref 1.9–3.7)
Glucose, Bld: 94 mg/dL (ref 65–99)
Potassium: 4.4 mmol/L (ref 3.5–5.3)
SODIUM: 140 mmol/L (ref 135–146)
Total Protein: 6.8 g/dL (ref 6.1–8.1)

## 2018-06-12 LAB — LIPID PANEL
CHOLESTEROL: 223 mg/dL — AB (ref <200)
HDL: 50 mg/dL — ABNORMAL LOW (ref >50)
LDL CHOLESTEROL (CALC): 140 mg/dL — AB
Non-HDL Cholesterol (Calc): 173 mg/dL (calc) — ABNORMAL HIGH (ref <130)
Total CHOL/HDL Ratio: 4.5 (calc) (ref <5.0)
Triglycerides: 192 mg/dL — ABNORMAL HIGH (ref <150)

## 2018-06-12 LAB — VITAMIN D 25 HYDROXY (VIT D DEFICIENCY, FRACTURES): Vit D, 25-Hydroxy: 24 ng/mL — ABNORMAL LOW (ref 30–100)

## 2018-06-12 LAB — TSH: TSH: 1.65 m[IU]/L (ref 0.40–4.50)

## 2018-06-15 ENCOUNTER — Ambulatory Visit (INDEPENDENT_AMBULATORY_CARE_PROVIDER_SITE_OTHER): Payer: Medicare HMO | Admitting: Family Medicine

## 2018-06-15 ENCOUNTER — Encounter: Payer: Self-pay | Admitting: Family Medicine

## 2018-06-15 VITALS — BP 138/86 | HR 81 | Resp 16 | Ht 62.0 in | Wt 181.0 lb

## 2018-06-15 DIAGNOSIS — E785 Hyperlipidemia, unspecified: Secondary | ICD-10-CM | POA: Diagnosis not present

## 2018-06-15 DIAGNOSIS — E669 Obesity, unspecified: Secondary | ICD-10-CM

## 2018-06-15 DIAGNOSIS — M160 Bilateral primary osteoarthritis of hip: Secondary | ICD-10-CM | POA: Diagnosis not present

## 2018-06-15 DIAGNOSIS — Z0000 Encounter for general adult medical examination without abnormal findings: Secondary | ICD-10-CM | POA: Diagnosis not present

## 2018-06-15 DIAGNOSIS — H547 Unspecified visual loss: Secondary | ICD-10-CM | POA: Diagnosis not present

## 2018-06-15 DIAGNOSIS — R1013 Epigastric pain: Secondary | ICD-10-CM

## 2018-06-15 MED ORDER — OMEPRAZOLE 40 MG PO CPDR: 40.0000 mg | DELAYED_RELEASE_CAPSULE | Freq: Every day | ORAL | 1 refills | Status: DC

## 2018-06-15 NOTE — Assessment & Plan Note (Signed)
Reduced vision with f/h of macular disease, refer to opthalmology

## 2018-06-15 NOTE — Progress Notes (Signed)
Yvonne Brewer     MRN: [id number]      DOB: 1946/01/28  HPI: Patient is in for annual physical exam. Heartburn x 1 year , loss of voice ,has had actual episodes of reflux sporadically. C/o  generalized aches and pains esp in hips  Recent labs, if available are reviewed. Immunization is reviewed , and  updated if needed.   PE:  BP 138/86   Pulse 81   Resp 16   Ht 5\' 2"  (1.575 m)   Wt 181 lb (82.1 kg)   SpO2 99%   BMI 33.11 kg/m \ Pleasant  female, alert and oriented x 3, in no cardio-pulmonary distress. Afebrile. HEENT No facial trauma or asymetry. Sinuses non tender.  Extra occullar muscles intact, pupils equally reactive to light. External ears normal, tympanic membranes clear. Oropharynx moist, no exudate. Neck: supple, no adenopathy,JVD or thyromegaly.No bruits.  Chest: Clear to ascultation bilaterally.No crackles or wheezes. Non tender to palpation  Breast: No asymetry,no masses or lumps. No tenderness. No nipple discharge or inversion. No axillary or supraclavicular adenopathy  Cardiovascular system; Heart sounds normal,  S1 and  S2 ,no S3.  No murmur, or thrill. Apical beat not displaced Peripheral pulses normal.  Abdomen: Soft,  no organomegaly or masses.mild epigastric discomfort to deep palpation No bruits. Bowel sounds normal. No guarding, tenderness or rebound.     Musculoskeletal exam: Decreased though adequate  ROM of spine, hips , shoulders and knees. No deformity ,swelling or crepitus noted. No muscle wasting or atrophy.   Neurologic: Cranial nerves 2 to 12 intact. Power, tone ,sensation and reflexes normal throughout. No disturbance in gait. No tremor.  Skin: Intact, no ulceration, erythema , scaling or rash noted. Pigmentation normal throughout  Psych; Normal mood and affect. Judgement and concentration normal   Assessment & Plan:  Annual physical exam Annual exam as documented. Counseling done  re healthy lifestyle  involving commitment to 150 minutes exercise per week, heart healthy diet, and attaining healthy weight.The importance of adequate sleep also discussed. Regular seat belt use and home safety, is also discussed. Changes in health habits are decided on by the patient with goals and time frames  set for achieving them. Immunization and cancer screening needs are specifically addressed at this visit.    Dyspepsia Reflux symptoms and bloating reportedly intermittently by patient, still drinks 1 cup of coffee, needs to stop , also work on weight loss and commit to high dose PPI for 2 weeks, if she remains symptomatic she is to contact me for referral to GI, denies dyspesia  Obesity (BMI 30.0-34.9) Improved and she ius applauded on this Patient re-educated about  the importance of commitment to a  minimum of 150 minutes of exercise per week.  The importance of healthy food choices with portion control discussed. Encouraged to start a food diary, count calories and to consider  joining a support group. Sample diet sheets offered. Goals set by the patient for the next several months.   Weight /BMI 06/15/2018 04/09/2018 02/05/2018  WEIGHT 181 lb 187 lb 188 lb  HEIGHT 5\' 2"  5\' 2"  5\' 2"   BMI 33.11 kg/m2 34.2 kg/m2 34.39 kg/m2      Osteoarthritis of both hips Increased pain and stiffness with activity reported, will work on committed exercise , weight loss and will use tylenol or low dose ibuprofen for pain  Hyperlipidemia LDL goal <100 Hyperlipidemia:Low fat diet discussed and encouraged.   Lipid Panel  Lab Results  Component Value Date  CHOL 223 (H) 06/11/2018   HDL 50 (L) 06/11/2018   LDLCALC 140 (H) 06/11/2018   TRIG 192 (H) 06/11/2018   CHOLHDL 4.5 06/11/2018   Deteriorated , nopt at goal, dietary modification only Updated lab needed at next visit.

## 2018-06-15 NOTE — Assessment & Plan Note (Signed)
Increased pain and stiffness with activity reported, will work on committed exercise , weight loss and will use tylenol or low dose ibuprofen for pain

## 2018-06-15 NOTE — Assessment & Plan Note (Signed)
Hyperlipidemia:Low fat diet discussed and encouraged.   Lipid Panel  Lab Results  Component Value Date   CHOL 223 (H) 06/11/2018   HDL 50 (L) 06/11/2018   LDLCALC 140 (H) 06/11/2018   TRIG 192 (H) 06/11/2018   CHOLHDL 4.5 06/11/2018   Deteriorated , nopt at goal, dietary modification only Updated lab needed at next visit.

## 2018-06-15 NOTE — Patient Instructions (Signed)
F/U early May, call if you need me before,   Fasting lipid and chem 7 mid April  Start once daily omeprazole for heartburn and if this continues to be aproblem after you styop coffee and lose weight also, then you will need to see GI Doc, please send a message  CONGRATS on weight loss, keep this up and mindful eating to improve health  Movement , stretches and weight loss are best for stiff joints, topiocal rubs and tylenol may be used, and occasionally ibuprofen or alleve  Start oTC once daily vit D3 1000 IU  Check you pharmacy you need the shingrix vaccine  Thank you  for choosing [facility]. We consider it a privelige to serve you.  Delivering excellent health care in a caring and  compassionate way is our goal.  Partnering with you,  so that together we can achieve this goal is our strategy.

## 2018-06-15 NOTE — Assessment & Plan Note (Signed)

## 2018-06-15 NOTE — Assessment & Plan Note (Signed)
Reflux symptoms and bloating reportedly intermittently by patient, still drinks 1 cup of coffee, needs to stop , also work on weight loss and commit to high dose PPI for 2 weeks, if she remains symptomatic she is to contact me for referral to GI, denies dyspesia

## 2018-06-15 NOTE — Assessment & Plan Note (Signed)
Improved and she ius applauded on this Patient re-educated about  the importance of commitment to a  minimum of 150 minutes of exercise per week.  The importance of healthy food choices with portion control discussed. Encouraged to start a food diary, count calories and to consider  joining a support group. Sample diet sheets offered. Goals set by the patient for the next several months.   Weight /BMI 06/15/2018 04/09/2018 02/05/2018  WEIGHT 181 lb 187 lb 188 lb  HEIGHT 5\' 2"  5\' 2"  5\' 2"   BMI 33.11 kg/m2 34.2 kg/m2 34.39 kg/m2

## 2018-06-29 ENCOUNTER — Other Ambulatory Visit: Payer: Self-pay | Admitting: Family Medicine

## 2018-07-06 ENCOUNTER — Other Ambulatory Visit: Payer: Self-pay

## 2018-07-06 DIAGNOSIS — K219 Gastro-esophageal reflux disease without esophagitis: Secondary | ICD-10-CM

## 2018-07-06 MED ORDER — OMEPRAZOLE 40 MG PO CPDR: DELAYED_RELEASE_CAPSULE | ORAL | 2 refills | Status: DC

## 2018-07-07 ENCOUNTER — Ambulatory Visit: Payer: BC Managed Care – PPO | Admitting: Nutrition

## 2018-07-15 ENCOUNTER — Other Ambulatory Visit: Payer: Self-pay | Admitting: Family Medicine

## 2018-07-16 ENCOUNTER — Encounter: Payer: Medicare HMO | Attending: Family Medicine | Admitting: Nutrition

## 2018-07-16 VITALS — Ht 62.0 in | Wt 184.0 lb

## 2018-07-16 DIAGNOSIS — E669 Obesity, unspecified: Secondary | ICD-10-CM

## 2018-07-16 NOTE — Patient Instructions (Signed)
Goals 1. Increase intentional exercise daily. For 30-60 minutes a day 2. Lose 4 lbs.  And get to 180 lbs.

## 2018-07-16 NOTE — Progress Notes (Signed)
  Medical Nutrition Therapy:  Appt start time: 0277 end time:  1630.  Assessment:  Primary concerns today: Overweight. LIves with her husband. She does the cooking and shopping. Lost 3 lbs. Just moved in new house and has been busy remodeling. Watching portions. Still doing water aerobics weekly. Eating more fresh foods and avoiding processed foods.  LDL elevated and TCHOL and TG. Is working on cutting out snacks and sweets.  Choosing higher fiber foods.  CMP Latest Ref Rng & Units 06/11/2018 04/08/2017 02/20/2016  Glucose 65 - 99 mg/dL 94 92 103(H)  BUN 7 - 25 mg/dL 14 15 22   Creatinine 0.60 - 0.93 mg/dL 0.83 0.82 0.89  Sodium 135 - 146 mmol/L 140 140 140  Potassium 3.5 - 5.3 mmol/L 4.4 4.6 4.7  Chloride 98 - 110 mmol/L 102 105 102  CO2 20 - 32 mmol/L 28 27 26   Calcium 8.6 - 10.4 mg/dL 9.8 8.9 9.2  Total Protein 6.1 - 8.1 g/dL 6.8 6.3 -  Total Bilirubin 0.2 - 1.2 mg/dL 0.5 0.5 -  Alkaline Phos 33 - 130 U/L - 56 -  AST 10 - 35 U/L 17 17 -  ALT 6 - 29 U/L 13 15 -   Lipid Panel     Component Value Date/Time   CHOL 223 (H) 06/11/2018 0754   TRIG 192 (H) 06/11/2018 0754   HDL 50 (L) 06/11/2018 0754   CHOLHDL 4.5 06/11/2018 0754   VLDL 26 04/08/2017 0727   LDLCALC 140 (H) 06/11/2018 0754    Lab Results  Component Value Date   HGBA1C 5.3 04/08/2017     Preferred Learning Style:   No preference indicated   Learning Readiness:   Ready  Change in progress   MEDICATIONS:   DIETARY INTAKE:  24-hr recall:  B ( AM): Shredded wheat cereal, almond milk and blueberries or eggs and bacon and toast, coffee Snk ( AM):   L ( PM):Chinese or left overs.  Snk ( PM):  D ( PM): PIzza store bought, cheese thin crust, 2 pieces, water Zucchini bread Beverages: water, usnweet tea  Usual physical activity: wateraerobics  Estimated energy needs: 1200  calories 135 g carbohydrates 90 g protein 33 g fat  Progress Towards Goal(s):  In progress.   Nutritional Diagnosis:  NB-1.1  Food and nutrition-related knowledge deficit As related to Obsity  And Hyperlipidemia As evidenced by BMI > 30 and TCHOL >. 200 and LDL > 100 mg/dl.  Interventiion:  Nutrition and Diabetes education provided on My Plate, CHO counting, meal planning, portion sizes, timing of meals, avoiding snacks between meals  taking medications as prescribed, benefits of exercising 30 minutes per day and prevention of DM. Marland Kitchen Goals 1. Increase intentional exercise daily. For 30-60 minutes a day 2. Lose 4 lbs.  And get to 180 lbs. Increase high fiber foods  Teaching Method Utilized:  Visual Auditory Hands on  Handouts given during visit include:  The Plate Method   Meal Plan Card  Barriers to learning/adherence to lifestyle change: none  Demonstrated degree of understanding via:  Teach Back   Monitoring/Evaluation:  Dietary intake, exercise, , and body weight in 3-4 month(s).

## 2018-07-20 ENCOUNTER — Encounter: Payer: Self-pay | Admitting: Nutrition

## 2018-08-10 ENCOUNTER — Ambulatory Visit (INDEPENDENT_AMBULATORY_CARE_PROVIDER_SITE_OTHER): Payer: Medicare HMO

## 2018-08-10 VITALS — BP 131/80 | HR 78 | Resp 10 | Ht 62.0 in | Wt 184.0 lb

## 2018-08-10 DIAGNOSIS — Z0000 Encounter for general adult medical examination without abnormal findings: Secondary | ICD-10-CM

## 2018-08-10 NOTE — Progress Notes (Signed)
Subjective:   Yvonne Brewer is a 72 y.o. female who presents for Medicare Annual (Subsequent) preventive examination.  Review of Systems:   Cardiac Risk Factors include: advanced age (>25men, >79 women);obesity (BMI >30kg/m2);hypertension     Objective:     Vitals: BP 131/80   Pulse 78   Resp 10   Wt 184 lb (83.5 kg)   SpO2 98%   BMI 33.65 kg/m   Body mass index is 33.65 kg/m.  Advanced Directives 08/10/2018 05/21/2017 05/16/2016 03/08/2014 11/06/2011  Does Patient Have a Medical Advance Directive? No Yes No Patient would like information Patient has advance directive, copy not in chart  Type of Advance Directive - [place];Living will - - Living will  Copy of [place] in Chart? - - - - Copy requested from family  Would patient like information on creating a medical advance directive? Yes (ED - Information included in AVS) - - - -  Pre-existing out of facility DNR order (yellow form or pink MOST form) - - - - No    Tobacco Social History   Tobacco Use  Smoking Status Never Smoker  Smokeless Tobacco Never Used     Counseling given: Not Answered   Clinical Intake:  Pre-visit preparation completed: Yes  Pain : No/denies pain Pain Score: 0-No pain     BMI - recorded: 33.65 Nutritional Status: BMI > 30  Obese Nutritional Risks: None Diabetes: No  How often do you need to have someone help you when you read instructions, pamphlets, or other written materials from your doctor or pharmacy?: 1 - Never What is the last grade level you completed in school?: college   Interpreter Needed?: No  Information entered by :: Francena Hanly LPN   Past Medical History:  Diagnosis Date  . Essential hypertension 2015  . Female bladder prolapse   . History of cystitis   . History of skin cancer   . Hyperlipidemia   . Obesity    Past Surgical History:  Procedure Laterality Date  . CATARACT EXTRACTION  2012   bilateral   .  COLONOSCOPY  11/06/2011   Procedure: COLONOSCOPY;  Surgeon: Rogene Houston, MD;  [facility];  Service: Endoscopy;  Laterality: N/A;  7:30  . COLONOSCOPY N/A 05/21/2017   Procedure: COLONOSCOPY;  Surgeon: Rogene Houston, MD;  [facility]: [facility];  Service: Endoscopy;  Laterality: N/A;  730-moved to 9/26 @10 :30am per Lelon Frohlich  . EYE SURGERY  2011   bilateral cataract with implants  . HAMMER TOE SURGERY Left 01/2016  . Left bunionectomy    . Squamous cell ca rt ant chest 1989    . YAG LASER APPLICATION Left 11/26/4740   Procedure: YAG LASER APPLICATION;  Surgeon: Rutherford Guys, MD;  Location: [facility];  Service: Ophthalmology;  Laterality: Left;  . YAG LASER APPLICATION Right 5/95/6387   Procedure: YAG LASER APPLICATION;  Surgeon: Rutherford Guys, MD;  Location: [facility];  Service: Ophthalmology;  Laterality: Right;   Family History  Problem Relation Age of Onset  . Stroke Mother   . Hypertension Mother   . Heart disease Father   . Cancer Maternal Grandmother   . Anesthesia problems Neg Hx   . Hypotension Neg Hx   . Malignant hyperthermia Neg Hx   . Pseudochol deficiency Neg Hx    Social History   Socioeconomic History  . Marital status: Married    Spouse name: Edison Nasuti   . Number of  children: 2  . Years of education: 53  . Highest education level: Master's degree (e.g., MA, MS, MEng, MEd, MSW, MBA)  Occupational History  . Not on file  Social Needs  . Financial resource strain: Not hard at all  . Food insecurity:    Worry: Never true    Inability: Never true  . Transportation needs:    Medical: No    Non-medical: No  Tobacco Use  . Smoking status: Never Smoker  . Smokeless tobacco: Never Used  Substance and Sexual Activity  . Alcohol use: Yes    Comment: glass of red wine most nights or scotch and water  . Drug use: No  . Sexual activity: Not Currently    Birth control/protection: Post-menopausal  Lifestyle  . Physical activity:    Days per week: 3 days    Minutes  per session: 60 min  . Stress: Not at all  Relationships  . Social connections:    Talks on phone: Never    Gets together: More than three times a week    Attends religious service: More than 4 times per year    Active member of club or organization: Yes    Attends meetings of clubs or organizations: More than 4 times per year    Relationship status: Married  Other Topics Concern  . Not on file  Social History Narrative   Lives alone with  husband     Outpatient Encounter Medications as of 08/10/2018  Medication Sig  . amLODipine (NORVASC) 5 MG tablet TAKE 1 TABLET BY MOUTH EVERY DAY  . cholecalciferol (VITAMIN D) 1000 units tablet Take 1 tablet (1,000 Units total) by mouth daily.  Marland Kitchen omeprazole (PRILOSEC) 40 MG capsule TAKE 1 CAPSULE BY MOUTH EVERY DAY (Patient not taking: Reported on 07/16/2018)   No facility-administered encounter medications on file as of 08/10/2018.     Activities of Daily Living In your present state of health, do you have any difficulty performing the following activities: 08/10/2018  Hearing? N  Vision? Y  Difficulty concentrating or making decisions? N  Walking or climbing stairs? N  Dressing or bathing? N  Doing errands, shopping? N  Preparing Food and eating ? N  Using the Toilet? N  In the past six months, have you accidently leaked urine? Y  Do you have problems with loss of bowel control? N  Managing your Medications? N  Managing your Finances? N  Housekeeping or managing your Housekeeping? N  Some recent data might be hidden    Patient Care Team: Fayrene Helper, MD as PCP - General Allyn Kenner, MD (Dermatology) Rogene Houston, MD as Consulting Physician (Gastroenterology) Sanjuana Kava, MD as Consulting Physician (Orthopedic Surgery) Rutherford Guys, MD as Consulting Physician (Ophthalmology) Wylene Simmer, MD as Consulting Physician (Orthopedic Surgery)    Assessment:   This is a routine wellness examination for  [facility].  Exercise Activities and Dietary recommendations Current Exercise Habits: Structured exercise class, Type of exercise: Other - see comments, Time (Minutes): 60, Frequency (Times/Week): 3, Weekly Exercise (Minutes/Week): 180, Intensity: Moderate, Exercise limited by: cardiac condition(s)  Goals    . Weight (lb) < 200 lb (90.7 kg)       Fall Risk Fall Risk  08/10/2018 06/15/2018 04/22/2018 02/05/2018 04/16/2017  Falls in the past year? 0 No No Yes No  Number falls in past yr: - - - 1 -  Injury with Fall? - - - Yes -  Risk for fall due to : - - -  History of fall(s) -   Is the patient's home free of loose throw rugs in walkways, pet beds, electrical cords, etc?   yes      Grab bars in the bathroom? yes      Handrails on the stairs?   yes      Adequate lighting?   yes  Timed Get Up and Go performed: Patient able to perform in 5 seconds without assistance   Depression Screen PHQ 2/9 Scores 08/10/2018 06/15/2018 04/22/2018 02/05/2018  PHQ - 2 Score 0 0 0 0  PHQ- 9 Score - - - -     Cognitive Function MMSE - Mini Mental State Exam 07/29/2016  Orientation to time 5  Orientation to Place 5  Registration 3  Attention/ Calculation 5  Recall 3  Language- name 2 objects 2  Language- repeat 1  Language- follow 3 step command 3  Language- read & follow direction 1  Write a sentence 1  Copy design 1  Total score 30     6CIT Screen 08/10/2018  What Year? 0 points  What month? 0 points  What time? 0 points  Count back from 20 0 points  Months in reverse 0 points  Repeat phrase 2 points  Total Score 2    Immunization History  Administered Date(s) Administered  . Hepatitis A 12/29/2002, 09/20/2003  . Influenza Split 05/23/2014  . Influenza Whole 10/13/2008, 06/06/2009  . Pneumococcal Conjugate-13 09/06/2014  . Pneumococcal Polysaccharide-23 12/31/2010  . Td 12/15/2002  . Zoster 09/01/2006    Qualifies for Shingles Vaccine? Up to date   Screening Tests Health  Maintenance  Topic Date Due  . TETANUS/TDAP  02/06/2019 (Originally 12/14/2012)  . MAMMOGRAM  04/16/2020  . COLONOSCOPY  05/21/2022  . INFLUENZA VACCINE  Completed  . DEXA SCAN  Completed  . Hepatitis C Screening  Completed  . PNA vac Low Risk Adult  Completed    Cancer Screenings: Lung: Low Dose CT Chest recommended if Age 43-80 years, 30 pack-year currently smoking OR have quit w/in 15years. Patient does not qualify. Breast:  Up to date on Mammogram? Yes   Up to date of Bone Density/Dexa? Yes Colorectal: up to date   Additional Screenings: : Hepatitis C Screening: complete      Plan:   continue to exercise and lose some weight   I have personally reviewed and noted the following in the patient's chart:   . Medical and social history . Use of alcohol, tobacco or illicit drugs  . Current medications and supplements . Functional ability and status . Nutritional status . Physical activity . Advanced directives . List of other physicians . Hospitalizations, surgeries, and ER visits in previous 12 months . Vitals . Screenings to include cognitive, depression, and falls . Referrals and appointments  In addition, I have reviewed and discussed with patient certain preventive protocols, quality metrics, and best practice recommendations. A written personalized care plan for preventive services as well as general preventive health recommendations were provided to patient.     Hayden Pedro, LPN  [id number]

## 2018-08-10 NOTE — Patient Instructions (Signed)
[name] , Thank you for taking time to come for your Medicare Wellness Visit. I appreciate your ongoing commitment to your health goals. Please review the following plan we discussed and let me know if I can assist you in the future.   Screening recommendations/referrals: Colonoscopy: Up to date  Mammogram: Up to date  Bone Density: up to  date  Recommended yearly ophthalmology/optometry visit for glaucoma screening and checkup Recommended yearly dental visit for hygiene and checkup  Vaccinations: Influenza vaccine: Up to date  Pneumococcal vaccine:up to date  Tdap vaccine: up to date  Shingles vaccine: up to date     Advanced directives: Information provided   Conditions/risks identified: hypertension   Next appointment: Wellness visit in one year    Preventive Care 43 Years and Older, Female Preventive care refers to lifestyle choices and visits with your health care provider that can promote health and wellness. What does preventive care include?  A yearly physical exam. This is also called an annual well check.  Dental exams once or twice a year.  Routine eye exams. Ask your health care provider how often you should have your eyes checked.  Personal lifestyle choices, including:  Daily care of your teeth and gums.  Regular physical activity.  Eating a healthy diet.  Avoiding tobacco and drug use.  Limiting alcohol use.  Practicing safe sex.  Taking low-dose aspirin every day.  Taking vitamin and mineral supplements as recommended by your health care provider. What happens during an annual well check? The services and screenings done by your health care provider during your annual well check will depend on your age, overall health, lifestyle risk factors, and family history of disease. Counseling  Your health care provider may ask you questions about your:  Alcohol use.  Tobacco use.  Drug use.  Emotional well-being.  Home and relationship  well-being.  Sexual activity.  Eating habits.  History of falls.  Memory and ability to understand (cognition).  Work and work Statistician.  Reproductive health. Screening  You may have the following tests or measurements:  Height, weight, and BMI.  Blood pressure.  Lipid and cholesterol levels. These may be checked every 5 years, or more frequently if you are over 19 years old.  Skin check.  Lung cancer screening. You may have this screening every year starting at age 71 if you have a 30-pack-year history of smoking and currently smoke or have quit within the past 15 years.  Fecal occult blood test (FOBT) of the stool. You may have this test every year starting at age 7.  Flexible sigmoidoscopy or colonoscopy. You may have a sigmoidoscopy every 5 years or a colonoscopy every 10 years starting at age 26.  Hepatitis C blood test.  Hepatitis B blood test.  Sexually transmitted disease (STD) testing.  Diabetes screening. This is done by checking your blood sugar (glucose) after you have not eaten for a while (fasting). You may have this done every 1-3 years.  Bone density scan. This is done to screen for osteoporosis. You may have this done starting at age 39.  Mammogram. This may be done every 1-2 years. Talk to your health care provider about how often you should have regular mammograms. Talk with your health care provider about your test results, treatment options, and if necessary, the need for more tests. Vaccines  Your health care provider may recommend certain vaccines, such as:  Influenza vaccine. This is recommended every year.  Tetanus, diphtheria, and acellular pertussis (  Tdap, Td) vaccine. You may need a Td booster every 10 years.  Zoster vaccine. You may need this after age 56.  Pneumococcal 13-valent conjugate (PCV13) vaccine. One dose is recommended after age 22.  Pneumococcal polysaccharide (PPSV23) vaccine. One dose is recommended after age  51. Talk to your health care provider about which screenings and vaccines you need and how often you need them. This information is not intended to replace advice given to you by your health care provider. Make sure you discuss any questions you have with your health care provider. Document Released: 09/08/2015 Document Revised: 05/01/2016 Document Reviewed: 06/13/2015 Elsevier Interactive Patient Education  2017 [place] Prevention in the Home Falls can cause injuries. They can happen to people of all ages. There are many things you can do to make your home safe and to help prevent falls. What can I do on the outside of my home?  Regularly fix the edges of walkways and driveways and fix any cracks.  Remove anything that might make you trip as you walk through a door, such as a raised step or threshold.  Trim any bushes or trees on the path to your home.  Use bright outdoor lighting.  Clear any walking paths of anything that might make someone trip, such as rocks or tools.  Regularly check to see if handrails are loose or broken. Make sure that both sides of any steps have handrails.  Any raised decks and porches should have guardrails on the edges.  Have any leaves, snow, or ice cleared regularly.  Use sand or salt on walking paths during winter.  Clean up any spills in your garage right away. This includes oil or grease spills. What can I do in the bathroom?  Use night lights.  Install grab bars by the toilet and in the tub and shower. Do not use towel bars as grab bars.  Use non-skid mats or decals in the tub or shower.  If you need to sit down in the shower, use a plastic, non-slip stool.  Keep the floor dry. Clean up any water that spills on the floor as soon as it happens.  Remove soap buildup in the tub or shower regularly.  Attach bath mats securely with double-sided non-slip rug tape.  Do not have throw rugs and other things on the floor that can make  you trip. What can I do in the bedroom?  Use night lights.  Make sure that you have a light by your bed that is easy to reach.  Do not use any sheets or blankets that are too big for your bed. They should not hang down onto the floor.  Have a firm chair that has side arms. You can use this for support while you get dressed.  Do not have throw rugs and other things on the floor that can make you trip. What can I do in the kitchen?  Clean up any spills right away.  Avoid walking on wet floors.  Keep items that you use a lot in easy-to-reach places.  If you need to reach something above you, use a strong step stool that has a grab bar.  Keep electrical cords out of the way.  Do not use floor polish or wax that makes floors slippery. If you must use wax, use non-skid floor wax.  Do not have throw rugs and other things on the floor that can make you trip. What can I do with my stairs?  Do  not leave any items on the stairs.  Make sure that there are handrails on both sides of the stairs and use them. Fix handrails that are broken or loose. Make sure that handrails are as long as the stairways.  Check any carpeting to make sure that it is firmly attached to the stairs. Fix any carpet that is loose or worn.  Avoid having throw rugs at the top or bottom of the stairs. If you do have throw rugs, attach them to the floor with carpet tape.  Make sure that you have a light switch at the top of the stairs and the bottom of the stairs. If you do not have them, ask someone to add them for you. What else can I do to help prevent falls?  Wear shoes that:  Do not have high heels.  Have rubber bottoms.  Are comfortable and fit you well.  Are closed at the toe. Do not wear sandals.  If you use a stepladder:  Make sure that it is fully opened. Do not climb a closed stepladder.  Make sure that both sides of the stepladder are locked into place.  Ask someone to hold it for you, if  possible.  Clearly mark and make sure that you can see:  Any grab bars or handrails.  First and last steps.  Where the edge of each step is.  Use tools that help you move around (mobility aids) if they are needed. These include:  Canes.  Walkers.  Scooters.  Crutches.  Turn on the lights when you go into a dark area. Replace any light bulbs as soon as they burn out.  Set up your furniture so you have a clear path. Avoid moving your furniture around.  If any of your floors are uneven, fix them.  If there are any pets around you, be aware of where they are.  Review your medicines with your doctor. Some medicines can make you feel dizzy. This can increase your chance of falling. Ask your doctor what other things that you can do to help prevent falls. This information is not intended to replace advice given to you by your health care provider. Make sure you discuss any questions you have with your health care provider. Document Released: 06/08/2009 Document Revised: 01/18/2016 Document Reviewed: 09/16/2014 Elsevier Interactive Patient Education  2017 [employer].

## 2018-09-03 DIAGNOSIS — H02834 Dermatochalasis of left upper eyelid: Secondary | ICD-10-CM | POA: Diagnosis not present

## 2018-10-05 ENCOUNTER — Ambulatory Visit (INDEPENDENT_AMBULATORY_CARE_PROVIDER_SITE_OTHER): Payer: Medicare HMO | Admitting: Internal Medicine

## 2018-10-06 ENCOUNTER — Encounter (INDEPENDENT_AMBULATORY_CARE_PROVIDER_SITE_OTHER): Payer: Self-pay | Admitting: Internal Medicine

## 2018-10-19 ENCOUNTER — Encounter: Payer: Medicare HMO | Attending: Family Medicine | Admitting: Nutrition

## 2018-10-19 ENCOUNTER — Encounter: Payer: Self-pay | Admitting: Nutrition

## 2018-10-19 VITALS — Ht 62.0 in | Wt 186.0 lb

## 2018-10-19 DIAGNOSIS — E669 Obesity, unspecified: Secondary | ICD-10-CM

## 2018-10-19 DIAGNOSIS — K219 Gastro-esophageal reflux disease without esophagitis: Secondary | ICD-10-CM | POA: Insufficient documentation

## 2018-10-19 DIAGNOSIS — E782 Mixed hyperlipidemia: Secondary | ICD-10-CM

## 2018-10-19 NOTE — Progress Notes (Signed)
Medical Nutrition Therapy:  Appt start time: 1100 end time:  1130.  Assessment:  Primary concerns today: Overweight. LIves with her husband. She does the cooking and shopping. Eating breakfast and eating a bigger lunch and smaller dinner. Complains of a lot of REFLUX symptoms. Is trying to take tums and a magnesium carbonate to treat symptoms OTC. Gained 2 lbs. Working on getting settled in her new house.Hasn't been able to exercise like she wanted but is now ready to exercise more consistent. Like Yoga. Problem areas identified are : spicy salsa, eating mints, chocolate, liquor of scotch before bed, and not eating supper and eating a large lunch. Has done well cutting out snacks and being more mindful of portions and eating less processed foods and higher fiber foods. Not on any high cholesterol medications at present. Trying to do it with diet and exercise first.   CMP Latest Ref Rng & Units 06/11/2018 04/08/2017 02/20/2016  Glucose 65 - 99 mg/dL 94 92 103(H)  BUN 7 - 25 mg/dL 14 15 22   Creatinine 0.60 - 0.93 mg/dL 0.83 0.82 0.89  Sodium 135 - 146 mmol/L 140 140 140  Potassium 3.5 - 5.3 mmol/L 4.4 4.6 4.7  Chloride 98 - 110 mmol/L 102 105 102  CO2 20 - 32 mmol/L 28 27 26   Calcium 8.6 - 10.4 mg/dL 9.8 8.9 9.2  Total Protein 6.1 - 8.1 g/dL 6.8 6.3 -  Total Bilirubin 0.2 - 1.2 mg/dL 0.5 0.5 -  Alkaline Phos 33 - 130 U/L - 56 -  AST 10 - 35 U/L 17 17 -  ALT 6 - 29 U/L 13 15 -   Lipid Panel     Component Value Date/Time   CHOL 223 (H) 06/11/2018 0754   TRIG 192 (H) 06/11/2018 0754   HDL 50 (L) 06/11/2018 0754   CHOLHDL 4.5 06/11/2018 0754   VLDL 26 04/08/2017 0727   LDLCALC 140 (H) 06/11/2018 0754    Lab Results  Component Value Date   HGBA1C 5.3 04/08/2017     Preferred Learning Style:   No preference indicated   Learning Readiness:   Ready  Change in progress   MEDICATIONS:   DIETARY INTAKE:  24-hr recall:  B ( AM): Shredded wheat cereal, almond milk and  blueberries or eggs and bacon and toast, coffee Snk ( AM):   L ( PM): Eats late lunch Soup and sandwich; usually eaten out; [place] or sit down Snk ( PM):  D ( PM): skips,, usually some nuts  Beverages: water, usnweet tea  Usual physical activity: wateraerobics  Estimated energy needs: 1200  calories 135 g carbohydrates 90 g protein 33 g fat  Progress Towards Goal(s):  In progress.   Nutritional Diagnosis:  NB-1.1 Food and nutrition-related knowledge deficit As related to Obsity  And Hyperlipidemia As evidenced by BMI > 30 and TCHOL >. 200 and LDL > 100 mg/dl.  Interventiion:  Nutrition and Diabetes education provided on My Plate, CHO counting, meal planning, portion sizes, timing of meals, avoiding snacks between meals  taking medications as prescribed, benefits of exercising 30 minutes per day and prevention of DM. Marland Kitchen Goals Eat 3 balanced meals per day Substitute beer for scotch. Don't skip meals Eat 3 hrs before bed No peppermint Avoid spicy foods; tomato based or acidic foods. Follow GERD recommendations  Teaching Method Utilized:  Visual Auditory Hands on  Handouts given during visit include:  The Plate Method   Meal Plan Card  Barriers to learning/adherence to lifestyle change: none  Demonstrated degree of understanding via:  Teach Back   Monitoring/Evaluation:  Dietary intake, exercise, , and body weight in PRN Recommend to follow up Lipid profile and refer to GI if GERD doesn't improve.

## 2018-10-19 NOTE — Patient Instructions (Signed)
Goals Eat 3 balanced meals per day Substitute beer for scotch. Don't skip meals Eat 3 hrs before bed No peppermint Avoid salty  Follow GERD recommendations

## 2018-10-26 ENCOUNTER — Telehealth: Payer: Self-pay

## 2018-10-26 DIAGNOSIS — J069 Acute upper respiratory infection, unspecified: Secondary | ICD-10-CM | POA: Diagnosis not present

## 2018-10-26 DIAGNOSIS — J329 Chronic sinusitis, unspecified: Secondary | ICD-10-CM | POA: Diagnosis not present

## 2018-10-26 NOTE — Telephone Encounter (Signed)
Patient called complaining of fever (100.2), nonproductive cough, headache and body aches. Stated that It started this morning. Advised patient to go to the Urgent Care to be tested for flu, or be treated for symptoms. Patient agreed

## 2018-10-29 ENCOUNTER — Encounter: Payer: Self-pay | Admitting: Orthopaedic Surgery

## 2018-10-29 ENCOUNTER — Ambulatory Visit (INDEPENDENT_AMBULATORY_CARE_PROVIDER_SITE_OTHER): Payer: Medicare HMO | Admitting: Orthopaedic Surgery

## 2018-10-29 VITALS — BP 127/77 | HR 79 | Ht 62.0 in | Wt 185.0 lb

## 2018-10-29 DIAGNOSIS — M7061 Trochanteric bursitis, right hip: Secondary | ICD-10-CM | POA: Diagnosis not present

## 2018-10-29 NOTE — Progress Notes (Signed)
Patient [id number] Yvonne Brewer, female DOB:May 16, 1946, 73 y.o. [id number]  Chief Complaint  Patient presents with  . Hip Pain    right lateral     HPI  Yvonne Brewer is a 73 y.o. female who has developed pain of the right lateral hip area.  She has no trauma, no swelling.  It hurts to touch and roll over on it at night in bed.  She has a problem with the left foot and feels her gait has changed to make her have right hip pain.    She has had surgery on the left foot with a prolonged second toe.  The toe is fused.    Body mass index is 33.84 kg/m.  ROS  Review of Systems  Musculoskeletal: Positive for arthralgias, gait problem and joint swelling.  All other systems reviewed and are negative.   All other systems reviewed and are negative.  The following is a summary of the past history medically, past history surgically, known current medicines, social history and family history.  This information is gathered electronically by the computer from prior information and documentation.  I review this each visit and have found including this information at this point in the chart is beneficial and informative.    Past Medical History:  Diagnosis Date  . Essential hypertension 2015  . Female bladder prolapse   . History of cystitis   . History of skin cancer   . Hyperlipidemia   . Obesity     Past Surgical History:  Procedure Laterality Date  . CATARACT EXTRACTION  2012   bilateral   . COLONOSCOPY  11/06/2011   Procedure: COLONOSCOPY;  Surgeon: Rogene Houston, MD;  Location: [facility];  Service: Endoscopy;  Laterality: N/A;  7:30  . COLONOSCOPY N/A 05/21/2017   Procedure: COLONOSCOPY;  Surgeon: Rogene Houston, MD;  Location: [facility];  Service: Endoscopy;  Laterality: N/A;  730-moved to 9/26 @10 :30am per Lelon Frohlich  . EYE SURGERY  2011   bilateral cataract with implants  . HAMMER TOE SURGERY Left 01/2016  . Left bunionectomy    . Squamous cell ca rt ant chest 1989     . YAG LASER APPLICATION Left 2/95/1884   Procedure: YAG LASER APPLICATION;  Surgeon: Rutherford Guys, MD;  Location: [facility];  Service: Ophthalmology;  Laterality: Left;  . YAG LASER APPLICATION Right 1/66/0630   Procedure: YAG LASER APPLICATION;  Surgeon: Rutherford Guys, MD;  Location: [facility];  Service: Ophthalmology;  Laterality: Right;    Family History  Problem Relation Age of Onset  . Stroke Mother   . Hypertension Mother   . Heart disease Father   . Cancer Maternal Grandmother   . Anesthesia problems Neg Hx   . Hypotension Neg Hx   . Malignant hyperthermia Neg Hx   . Pseudochol deficiency Neg Hx     Social History Social History   Tobacco Use  . Smoking status: Never Smoker  . Smokeless tobacco: Never Used  Substance Use Topics  . Alcohol use: Yes    Comment: glass of red wine most nights or scotch and water  . Drug use: No    Allergies  Allergen Reactions  . Ace Inhibitors Cough    Cough  . Penicillins Hives    Has patient had a PCN reaction causing immediate rash, facial/tongue/throat swelling, SOB or lightheadedness with hypotension: No Has patient had a PCN reaction causing severe rash involving mucus membranes or skin necrosis: No Has patient had a PCN  reaction that required hospitalization No Has patient had a PCN reaction occurring within the last 10 years: No If all of the above answers are "NO", then may proceed with Cephalosporin use.   . Sulfonamide Derivatives Hives    Current Outpatient Medications  Medication Sig Dispense Refill  . amLODipine (NORVASC) 5 MG tablet TAKE 1 TABLET BY MOUTH EVERY DAY 90 tablet 1  . calcium carbonate (TUMS - DOSED IN MG ELEMENTAL CALCIUM) 500 MG chewable tablet Chew 1 tablet by mouth daily.    Marland Kitchen alum hydroxide-mag trisilicate (GAVISCON) 18-84 MG CHEW chewable tablet Chew by mouth 3 (three) times daily as needed for indigestion or heartburn.    . cholecalciferol (VITAMIN D) 1000 units tablet Take 1 tablet (1,000 Units total)  by mouth daily. 100 tablet 1  . omeprazole (PRILOSEC) 40 MG capsule TAKE 1 CAPSULE BY MOUTH EVERY DAY (Patient not taking: Reported on 07/16/2018) 90 capsule 2   No current facility-administered medications for this visit.      Physical Exam  Blood pressure 127/77, pulse 79, height 5\' 2"  (1.575 m), weight 185 lb (83.9 kg).  Constitutional: overall normal hygiene, normal nutrition, well developed, normal grooming, normal body habitus. Assistive device:none  Musculoskeletal: gait and station Limp none, muscle tone and strength are normal, no tremors or atrophy is present.  .  Neurological: coordination overall normal.  Deep tendon reflex/nerve stretch intact.  Sensation normal.  Cranial nerves II-XII intact.   Skin:   Normal overall no scars, lesions, ulcers or rashes. No psoriasis.  Psychiatric: Alert and oriented x 3.  Recent memory intact, remote memory unclear.  Normal mood and affect. Well groomed.  Good eye contact.  Cardiovascular: overall no swelling, no varicosities, no edema bilaterally, normal temperatures of the legs and arms, no clubbing, cyanosis and good capillary refill.  Lymphatic: palpation is normal.  Right hip has tenderness over the trochanteric area. There is no redness or swelling. ROM of the hips is normal. All other systems reviewed and are negative   The patient has been educated about the nature of the problem(s) and counseled on treatment options.  The patient appeared to understand what I have discussed and is in agreement with it.  Encounter Diagnosis  Name Primary?  . Trochanteric bursitis of right hip Yes   PROCEDURE NOTE:  The patient request injection, verbal consent was obtained.  The right trochanteric area of the hip was prepped appropriately after time out was performed.   Sterile technique was observed and injection of 1 cc of Depo-Medrol 40 mg with several cc's of plain xylocaine. Anesthesia was provided by ethyl chloride and a 20-gauge  needle was used to inject the hip area. The injection was tolerated well.  A band aid dressing was applied.  The patient was advised to apply ice later today and tomorrow to the injection sight as needed.   PLAN Call if any problems.  Precautions discussed.  Continue current medications.   Return to clinic 2 weeks   Electronically Signed Sanjuana Kava, MD 3/5/202011:36 AM

## 2018-11-12 ENCOUNTER — Ambulatory Visit: Payer: Medicare HMO | Admitting: Orthopaedic Surgery

## 2018-12-21 DIAGNOSIS — Z85828 Personal history of other malignant neoplasm of skin: Secondary | ICD-10-CM | POA: Diagnosis not present

## 2018-12-21 DIAGNOSIS — H547 Unspecified visual loss: Secondary | ICD-10-CM | POA: Diagnosis not present

## 2018-12-21 DIAGNOSIS — R32 Unspecified urinary incontinence: Secondary | ICD-10-CM | POA: Diagnosis not present

## 2018-12-21 DIAGNOSIS — K219 Gastro-esophageal reflux disease without esophagitis: Secondary | ICD-10-CM | POA: Diagnosis not present

## 2018-12-21 DIAGNOSIS — M1990 Unspecified osteoarthritis, unspecified site: Secondary | ICD-10-CM | POA: Diagnosis not present

## 2018-12-21 DIAGNOSIS — I10 Essential (primary) hypertension: Secondary | ICD-10-CM | POA: Diagnosis not present

## 2018-12-21 DIAGNOSIS — Z8249 Family history of ischemic heart disease and other diseases of the circulatory system: Secondary | ICD-10-CM | POA: Diagnosis not present

## 2018-12-21 DIAGNOSIS — Z882 Allergy status to sulfonamides status: Secondary | ICD-10-CM | POA: Diagnosis not present

## 2018-12-21 DIAGNOSIS — Z880 Allergy status to penicillin: Secondary | ICD-10-CM | POA: Diagnosis not present

## 2018-12-31 ENCOUNTER — Encounter: Payer: Self-pay | Admitting: *Deleted

## 2019-01-04 ENCOUNTER — Other Ambulatory Visit: Payer: Self-pay

## 2019-01-04 ENCOUNTER — Ambulatory Visit (INDEPENDENT_AMBULATORY_CARE_PROVIDER_SITE_OTHER): Payer: Medicare HMO | Admitting: Family Medicine

## 2019-01-04 ENCOUNTER — Encounter: Payer: Self-pay | Admitting: Family Medicine

## 2019-01-04 VITALS — BP 122/84 | HR 87 | Resp 15 | Ht 62.0 in | Wt 186.0 lb

## 2019-01-04 DIAGNOSIS — I10 Essential (primary) hypertension: Secondary | ICD-10-CM

## 2019-01-04 DIAGNOSIS — B369 Superficial mycosis, unspecified: Secondary | ICD-10-CM | POA: Diagnosis not present

## 2019-01-04 DIAGNOSIS — E669 Obesity, unspecified: Secondary | ICD-10-CM

## 2019-01-04 DIAGNOSIS — L03311 Cellulitis of abdominal wall: Secondary | ICD-10-CM | POA: Diagnosis not present

## 2019-01-04 DIAGNOSIS — Z7189 Other specified counseling: Secondary | ICD-10-CM

## 2019-01-04 DIAGNOSIS — S30861S Insect bite (nonvenomous) of abdominal wall, sequela: Secondary | ICD-10-CM

## 2019-01-04 DIAGNOSIS — E785 Hyperlipidemia, unspecified: Secondary | ICD-10-CM

## 2019-01-04 DIAGNOSIS — R7302 Impaired glucose tolerance (oral): Secondary | ICD-10-CM

## 2019-01-04 DIAGNOSIS — W57XXXS Bitten or stung by nonvenomous insect and other nonvenomous arthropods, sequela: Secondary | ICD-10-CM

## 2019-01-04 MED ORDER — CLOTRIMAZOLE-BETAMETHASONE 1-0.05 % EX CREA: TOPICAL_CREAM | CUTANEOUS | 0 refills | Status: DC

## 2019-01-04 NOTE — Patient Instructions (Addendum)
Physical exam with MD early November, call if you need me before  Antifungal cream to be applied to dry itchy rash  Use ointment provided on rash on lower abdomen please once daily for next 5 days  Please get fasting cBC, lipid, cmp and eGFr and hBA1C and [place]  Oct 20 or after  Think about what you will eat, plan ahead. Choose " clean, green, fresh or frozen" over canned, processed or packaged foods which are more sugary, salty and fatty. 70 to 75% of food eaten should be vegetables and fruit. Three meals at set times with snacks allowed between meals, but they must be fruit or vegetables. Aim to eat over a 12 hour period , example 7 am to 7 pm, and STOP after  your last meal of the day. Drink water,generally about 64 ounces per day, no other drink is as healthy. Fruit juice is best enjoyed in a healthy way, by EATING the fruit.  Thanks for choosing [facility], we consider it a privelige to serve you.

## 2019-01-05 ENCOUNTER — Ambulatory Visit: Payer: Medicare HMO | Admitting: Family Medicine

## 2019-01-10 DIAGNOSIS — S30861A Insect bite (nonvenomous) of abdominal wall, initial encounter: Secondary | ICD-10-CM | POA: Insufficient documentation

## 2019-01-10 DIAGNOSIS — Z7189 Other specified counseling: Secondary | ICD-10-CM | POA: Insufficient documentation

## 2019-01-10 DIAGNOSIS — L0390 Cellulitis, unspecified: Secondary | ICD-10-CM | POA: Insufficient documentation

## 2019-01-10 NOTE — Progress Notes (Signed)
Yvonne Brewer     MRN: [id number]      DOB: 21-Feb-1946   HPI Yvonne Brewer is here with skin concerns. Two different rashes present, one on upper extremity right primarily , which has occurred after increased sun exposure and working in her yard as well as at [employer],  Rash is mildly pruritic and never had purulent drainage. The other rash is red and linear on lower abdominal fold, had tick bit approx 3 to 4 weeks ago, was treated with course of doxycycline, now concerned about nodule in area as well as the new rash. Denies fever , chills, muscle aches or fatigue C/o inability to lose weight and volunteers that this is due to her food intake and reduced exercise , will work on both    ROS Denies recent fever or chills. Denies sinus pressure, nasal congestion, ear pain or sore throat. Denies chest congestion, productive cough or wheezing. Denies chest pains, palpitations and leg swelling Denies abdominal pain, nausea, vomiting,diarrhea or constipation.   Denies dysuria, frequency, Denies uncontrolled joint pain, swelling and limitation in mobility. Denies headaches, seizures, numbness, or tingling. Denies depression, anxiety or insomnia.  PE  BP 122/84   Pulse 87   Resp 15   Ht 5\' 2"  (1.575 m)   Wt 186 lb (84.4 kg)   SpO2 97%   BMI 34.02 kg/m   Patient alert and oriented and in no cardiopulmonary distress.  HEENT: No facial asymmetry, EOMI,   oropharynx pink and moist.  Neck supple no JVD, no mass.  Chest: Clear to auscultation bilaterally.  CVS: S1, S2 no murmurs,ABD: Soft non tender.   Ext: No edema  MS: Adequate ROM spine, shoulders, hips and knees.  Skin:macular rash on RUE ( arm diameter approx 4 cm max, scaly border, , hypopigmented rash) Linear erythematous rash on lower abdominal fold , no purulent drainage, no surrounding skin erythema Nodule in area where tick was removed, non tender, mildly erythematous, no purulent drainage  Psych: Good eye contact,  normal affect. Memory intact not anxious or depressed appearing.  CNS: CN 2-12 intact, power,  normal throughout.no focal deficits noted.   Assessment & Plan  Dermatomycosis Clotrimazole / betameth twice daily for 1 week, thsn as needed to affected area, primarily right arm  Cellulitis Erythematous rash on lower abdomen, topical antibiotic ointment / bactroban twice daily for 5 days , then as needed  Essential hypertension Controlled, no change in medication DASH diet and commitment to daily physical activity for a minimum of 30 minutes discussed and encouraged, as a part of hypertension management. The importance of attaining a healthy weight is also discussed.  BP/Weight 01/04/2019 10/29/2018 10/19/2018 08/10/2018 07/16/2018 06/15/2018 0/34/9179  Systolic BP 150 569 - 794 - 801 -  Diastolic BP 84 77 - 80 - 86 -  Wt. (Lbs) 186 185 186 184 184 181 187  BMI 34.02 33.84 34.02 33.65 33.65 33.11 34.2       Obesity (BMI 30.0-34.9) Unchanged Patient re-educated about  the importance of commitment to a  minimum of 150 minutes of exercise per week as able.  The importance of healthy food choices with portion control discussed, as well as eating regularly and within a 12 hour window most days. The need to choose "clean , green" food 50 to 75% of the time is discussed, as well as to make water the primary drink and set a goal of 64 ounces water daily.    Weight /BMI 01/04/2019 10/29/2018 10/19/2018  WEIGHT 186 lb 185 lb 186 lb  HEIGHT 5\' 2"  5\' 2"  5\' 2"   BMI 34.02 kg/m2 33.84 kg/m2 34.02 kg/m2      Tick bite of abdomen Residual erythematous nodular area, was treated by doxycycline, now with fine linear rash on lower abdominal fold, no residual tick in area, bactroban samples provided  Educated About Covid-19 Virus Infection Covid-19 Education  The signs and symptoms of of COVID -19 were discussed with the patient and how to seek care for testing. ( follow up with PCP or arrange   E-visit) The importance of social  distancing is discussed today.

## 2019-01-10 NOTE — Assessment & Plan Note (Signed)
Controlled, no change in medication DASH diet and commitment to daily physical activity for a minimum of 30 minutes discussed and encouraged, as a part of hypertension management. The importance of attaining a healthy weight is also discussed.  BP/Weight 01/04/2019 10/29/2018 10/19/2018 08/10/2018 07/16/2018 06/15/2018 4/85/9276  Systolic BP 394 320 - 037 - 944 -  Diastolic BP 84 77 - 80 - 86 -  Wt. (Lbs) 186 185 186 184 184 181 187  BMI 34.02 33.84 34.02 33.65 33.65 33.11 34.2

## 2019-01-10 NOTE — Assessment & Plan Note (Signed)
Erythematous rash on lower abdomen, topical antibiotic ointment / bactroban twice daily for 5 days , then as needed

## 2019-01-10 NOTE — Assessment & Plan Note (Signed)
Clotrimazole / betameth twice daily for 1 week, thsn as needed to affected area, primarily right arm

## 2019-01-10 NOTE — Assessment & Plan Note (Signed)
Unchanged Patient re-educated about  the importance of commitment to a  minimum of 150 minutes of exercise per week as able.  The importance of healthy food choices with portion control discussed, as well as eating regularly and within a 12 hour window most days. The need to choose "clean , green" food 50 to 75% of the time is discussed, as well as to make water the primary drink and set a goal of 64 ounces water daily.    Weight /BMI 01/04/2019 10/29/2018 10/19/2018  WEIGHT 186 lb 185 lb 186 lb  HEIGHT 5\' 2"  5\' 2"  5\' 2"   BMI 34.02 kg/m2 33.84 kg/m2 34.02 kg/m2

## 2019-01-10 NOTE — Assessment & Plan Note (Addendum)
Residual erythematous nodular area, was treated by doxycycline, now with fine linear rash on lower abdominal fold, no residual tick in area, bactroban samples provided

## 2019-01-10 NOTE — Assessment & Plan Note (Signed)
Covid-19 Education  The signs and symptoms of of COVID -19 were discussed with the patient and how to seek care for testing. ( follow up with PCP or arrange  E-visit) The importance of social  distancing is discussed today.  

## 2019-01-28 ENCOUNTER — Other Ambulatory Visit: Payer: Self-pay | Admitting: Family Medicine

## 2019-02-09 ENCOUNTER — Ambulatory Visit: Payer: BC Managed Care – PPO | Admitting: Family Medicine

## 2019-03-30 ENCOUNTER — Other Ambulatory Visit (HOSPITAL_COMMUNITY): Payer: Self-pay | Admitting: Family Medicine

## 2019-03-30 DIAGNOSIS — Z1231 Encounter for screening mammogram for malignant neoplasm of breast: Secondary | ICD-10-CM

## 2019-04-05 ENCOUNTER — Other Ambulatory Visit: Payer: Self-pay

## 2019-04-05 ENCOUNTER — Encounter: Payer: Self-pay | Admitting: Family Medicine

## 2019-04-05 ENCOUNTER — Ambulatory Visit (INDEPENDENT_AMBULATORY_CARE_PROVIDER_SITE_OTHER): Payer: Medicare HMO | Admitting: Family Medicine

## 2019-04-05 ENCOUNTER — Encounter (INDEPENDENT_AMBULATORY_CARE_PROVIDER_SITE_OTHER): Payer: Self-pay

## 2019-04-05 VITALS — BP 120/77 | HR 88 | Temp 98.2°F | Resp 12 | Ht 62.5 in | Wt 181.1 lb

## 2019-04-05 DIAGNOSIS — I10 Essential (primary) hypertension: Secondary | ICD-10-CM | POA: Diagnosis not present

## 2019-04-05 DIAGNOSIS — M7989 Other specified soft tissue disorders: Secondary | ICD-10-CM

## 2019-04-05 NOTE — Patient Instructions (Signed)
    Thank you for coming into the office today. I appreciate the opportunity to provide you with the care for your health and wellness. Today we discussed:  Swelling in hands  Follow up: as needed  Next appt: 07/07/2019  Avoid take out or ask for no salt if possible.  Wash hands well after gardening.  Please continue to practice social distancing to keep you, your family, and our community safe.  If you must go out, please wear a Mask and practice good handwashing.  [place] YOUR HANDS WELL AND FREQUENTLY. AVOID TOUCHING YOUR FACE, UNLESS YOUR HANDS ARE FRESHLY WASHED.  GET FRESH AIR DAILY. STAY HYDRATED WITH WATER.   It was a pleasure to see you and I look forward to continuing to work together on your health and well-being. Please do not hesitate to call the office if you need care or have questions about your care.  Have a wonderful day and week.  With Gratitude,  Cherly Beach, DNP, AGNP-BC

## 2019-04-05 NOTE — Progress Notes (Signed)
Subjective:     Patient ID: Yvonne Brewer, female   DOB: 06/12/1946, 73 y.o.   MRN: [id number]  Yvonne Brewer presents for Edema (really noticing it in her hands. has had to remove her wedding ring. may be some in her ankles)  About 2 weeks ago she noticed hand swelling. Unable to wear her wedding ring. No pain or injury. Reports eating take out more.Reports working in yard more as well. No itching or rash. Denies chest pain, cough, SHOB, leg swelling, palpitations.  Today patient denies signs and symptoms of COVID 19 infection including fever, chills, cough, shortness of breath, and headache.  Past Medical, Surgical, Social History, Allergies, and Medications have been Reviewed.  Past Medical History:  Diagnosis Date  . Essential hypertension 2015  . Female bladder prolapse   . History of cystitis   . History of skin cancer   . Hyperlipidemia   . Obesity    Past Surgical History:  Procedure Laterality Date  . CATARACT EXTRACTION  2012   bilateral   . COLONOSCOPY  11/06/2011   Procedure: COLONOSCOPY;  Surgeon: Rogene Houston, MD;  Location: [facility];  Service: Endoscopy;  Laterality: N/A;  7:30  . COLONOSCOPY N/A 05/21/2017   Procedure: COLONOSCOPY;  Surgeon: Rogene Houston, MD;  Location: [facility];  Service: Endoscopy;  Laterality: N/A;  730-moved to 9/26 @10 :30am per Lelon Frohlich  . EYE SURGERY  2011   bilateral cataract with implants  . HAMMER TOE SURGERY Left 01/2016  . Left bunionectomy    . Squamous cell ca rt ant chest 1989    . YAG LASER APPLICATION Left 11/19/7122   Procedure: YAG LASER APPLICATION;  Surgeon: Rutherford Guys, MD;  Location: [facility];  Service: Ophthalmology;  Laterality: Left;  . YAG LASER APPLICATION Right 5/80/9983   Procedure: YAG LASER APPLICATION;  Surgeon: Rutherford Guys, MD;  Location: [facility];  Service: Ophthalmology;  Laterality: Right;   Social History   Socioeconomic History  . Marital status: Married    Spouse name: Edison Nasuti   .  Number of children: 2  . Years of education: 62  . Highest education level: Master's degree (e.g., MA, MS, MEng, MEd, MSW, MBA)  Occupational History  . Not on file  Social Needs  . Financial resource strain: Not hard at all  . Food insecurity    Worry: Never true    Inability: Never true  . Transportation needs    Medical: No    Non-medical: No  Tobacco Use  . Smoking status: Never Smoker  . Smokeless tobacco: Never Used  Substance and Sexual Activity  . Alcohol use: Yes    Comment: glass of red wine most nights or scotch and water  . Drug use: No  . Sexual activity: Not Currently    Birth control/protection: Post-menopausal  Lifestyle  . Physical activity    Days per week: 3 days    Minutes per session: 60 min  . Stress: Not at all  Relationships  . Social Herbalist on phone: Never    Gets together: More than three times a week    Attends religious service: More than 4 times per year    Active member of club or organization: Yes    Attends meetings of clubs or organizations: More than 4 times per year    Relationship status: Married  . Intimate partner violence    Fear of current or ex partner: No  Emotionally abused: No    Physically abused: No    Forced sexual activity: No  Other Topics Concern  . Not on file  Social History Narrative   Lives alone with  husband     Outpatient Encounter Medications as of 04/05/2019  Medication Sig  . amLODipine (NORVASC) 5 MG tablet TAKE 1 TABLET BY MOUTH EVERY DAY  . omeprazole (PRILOSEC) 20 MG capsule Take 20 mg by mouth daily.  . [DISCONTINUED] alum hydroxide-mag trisilicate (GAVISCON) 84-69 MG CHEW chewable tablet Chew by mouth 3 (three) times daily as needed for indigestion or heartburn.  . [DISCONTINUED] calcium carbonate (TUMS - DOSED IN MG ELEMENTAL CALCIUM) 500 MG chewable tablet Chew 1 tablet by mouth daily.  . [DISCONTINUED] clotrimazole-betamethasone (LOTRISONE) cream Apply two times daily to dry scaly ,  itchy rashes for 1 week, then , as needed  . [DISCONTINUED] mupirocin nasal ointment (BACTROBAN) 2 % Apply sparingly once daily to rash on lower abdomen x 5 days, then as needed   No facility-administered encounter medications on file as of 04/05/2019.    Allergies  Allergen Reactions  . Ace Inhibitors Cough    Cough  . Penicillins Hives    Has patient had a PCN reaction causing immediate rash, facial/tongue/throat swelling, SOB or lightheadedness with hypotension: No Has patient had a PCN reaction causing severe rash involving mucus membranes or skin necrosis: No Has patient had a PCN reaction that required hospitalization No Has patient had a PCN reaction occurring within the last 10 years: No If all of the above answers are "NO", then may proceed with Cephalosporin use.   . Sulfonamide Derivatives Hives    Review of Systems  Constitutional: Negative for chills and fever.  HENT: Negative.   Eyes: Negative.   Respiratory: Negative.  Negative for cough and shortness of breath.   Cardiovascular: Negative.   Gastrointestinal: Negative.   Endocrine: Negative.   Genitourinary: Negative.   Musculoskeletal: Negative.   Skin: Negative.   Allergic/Immunologic: Negative.   Neurological: Negative.   Hematological: Negative.   Psychiatric/Behavioral: Negative.   All other systems reviewed and are negative.      Objective:     BP 120/77   Pulse 88   Temp 98.2 F (36.8 C) (Oral)   Resp 12   Ht 5' 2.5" (1.588 m)   Wt 181 lb 1.9 oz (82.2 kg)   SpO2 93%   BMI 32.60 kg/m   Physical Exam Vitals signs and nursing note reviewed.  Constitutional:      Appearance: Normal appearance. She is well-developed and well-groomed. She is obese.  HENT:     Head: Normocephalic and atraumatic.     Right Ear: External ear normal.     Left Ear: External ear normal.     Nose: Nose normal.     Mouth/Throat:     Mouth: Mucous membranes are moist.     Pharynx: Oropharynx is clear.  Eyes:      General:        Right eye: No discharge.        Left eye: No discharge.     Conjunctiva/sclera: Conjunctivae normal.  Neck:     Musculoskeletal: Normal range of motion and neck supple.  Cardiovascular:     Rate and Rhythm: Normal rate and regular rhythm.     Pulses: Normal pulses.     Heart sounds: Normal heart sounds.  Pulmonary:     Effort: Pulmonary effort is normal.     Breath sounds: Normal breath  sounds.  Musculoskeletal: Normal range of motion.     Comments: Finger swelling noted bilaterally   Skin:    General: Skin is warm.  Neurological:     General: No focal deficit present.     Mental Status: She is alert and oriented to person, place, and time.  Psychiatric:        Attention and Perception: Attention normal.        Mood and Affect: Mood normal.        Speech: Speech normal.        Behavior: Behavior normal. Behavior is cooperative.        Thought Content: Thought content normal.        Cognition and Memory: Cognition normal.        Judgment: Judgment normal.        Assessment and Plan       1. Swelling of finger of both hands Educated on causes of hand/finger swelling Encouraged to not get take out or if eating out try and avoid salt. Also encouraged to wash hands well after working in the garden. Keep hands propped up when sitting and not down by sides. Advised to call if this does not improve  There is no s&s of heart or lung involvement today.  2. HTN, goal below 140/90 Controlled, continue current medication regimen.  No refills needed.   Follow Up: 07/07/2019  Perlie Mayo, DNP, AGNP-BC [address], [place] [address] Office Hours: Mon-Thurs 8 am-5 pm; Fri 8 am-12 pm Office Phone:  [phone]  Office Fax: [phone]

## 2019-04-19 ENCOUNTER — Ambulatory Visit (INDEPENDENT_AMBULATORY_CARE_PROVIDER_SITE_OTHER): Payer: Medicare HMO

## 2019-04-19 ENCOUNTER — Other Ambulatory Visit: Payer: Self-pay

## 2019-04-19 DIAGNOSIS — Z23 Encounter for immunization: Secondary | ICD-10-CM

## 2019-05-01 ENCOUNTER — Other Ambulatory Visit: Payer: Self-pay | Admitting: Family Medicine

## 2019-05-05 ENCOUNTER — Other Ambulatory Visit: Payer: Self-pay

## 2019-05-05 ENCOUNTER — Ambulatory Visit (HOSPITAL_COMMUNITY)
Admission: RE | Admit: 2019-05-05 | Discharge: 2019-05-05 | Disposition: A | Discharge status: Home / Self Care | Payer: Medicare HMO | Source: Ambulatory Visit | Attending: Family Medicine | Admitting: Family Medicine

## 2019-05-05 DIAGNOSIS — Z1231 Encounter for screening mammogram for malignant neoplasm of breast: Secondary | ICD-10-CM | POA: Insufficient documentation

## 2019-05-12 ENCOUNTER — Other Ambulatory Visit: Payer: Self-pay | Admitting: Family Medicine

## 2019-06-09 DIAGNOSIS — R69 Illness, unspecified: Secondary | ICD-10-CM | POA: Diagnosis not present

## 2019-07-06 DIAGNOSIS — E785 Hyperlipidemia, unspecified: Secondary | ICD-10-CM | POA: Diagnosis not present

## 2019-07-06 DIAGNOSIS — E669 Obesity, unspecified: Secondary | ICD-10-CM | POA: Diagnosis not present

## 2019-07-06 DIAGNOSIS — R7302 Impaired glucose tolerance (oral): Secondary | ICD-10-CM | POA: Diagnosis not present

## 2019-07-06 DIAGNOSIS — I10 Essential (primary) hypertension: Secondary | ICD-10-CM | POA: Diagnosis not present

## 2019-07-07 ENCOUNTER — Other Ambulatory Visit: Payer: Self-pay

## 2019-07-07 ENCOUNTER — Encounter: Payer: Self-pay | Admitting: Family Medicine

## 2019-07-07 ENCOUNTER — Ambulatory Visit (INDEPENDENT_AMBULATORY_CARE_PROVIDER_SITE_OTHER): Payer: Medicare HMO | Admitting: Family Medicine

## 2019-07-07 VITALS — BP 122/80 | HR 87 | Temp 98.7°F | Resp 15 | Ht 63.0 in | Wt 181.0 lb

## 2019-07-07 DIAGNOSIS — F419 Anxiety disorder, unspecified: Secondary | ICD-10-CM

## 2019-07-07 DIAGNOSIS — Z0000 Encounter for general adult medical examination without abnormal findings: Secondary | ICD-10-CM

## 2019-07-07 DIAGNOSIS — R69 Illness, unspecified: Secondary | ICD-10-CM | POA: Diagnosis not present

## 2019-07-07 DIAGNOSIS — E669 Obesity, unspecified: Secondary | ICD-10-CM

## 2019-07-07 DIAGNOSIS — R12 Heartburn: Secondary | ICD-10-CM

## 2019-07-07 LAB — COMPLETE METABOLIC PANEL WITH GFR
AG Ratio: 1.8 (calc) (ref 1.0–2.5)
ALT: 12 U/L (ref 6–29)
AST: 16 U/L (ref 10–35)
Albumin: 4.4 g/dL (ref 3.6–5.1)
Alkaline phosphatase (APISO): 60 U/L (ref 37–153)
BUN: 13 mg/dL (ref 7–25)
CO2: 32 mmol/L (ref 20–32)
Calcium: 9.6 mg/dL (ref 8.6–10.4)
Chloride: 104 mmol/L (ref 98–110)
Creat: 0.89 mg/dL (ref 0.60–0.93)
GFR, Est African American: 75 mL/min/{1.73_m2} (ref >=60)
GFR, Est Non African American: 64 mL/min/{1.73_m2} (ref >=60)
Globulin: 2.4 g/dL (calc) (ref 1.9–3.7)
Glucose, Bld: 99 mg/dL (ref 65–99)
Potassium: 5 mmol/L (ref 3.5–5.3)
Sodium: 142 mmol/L (ref 135–146)
Total Bilirubin: 0.5 mg/dL (ref 0.2–1.2)
Total Protein: 6.8 g/dL (ref 6.1–8.1)

## 2019-07-07 LAB — CBC
HCT: 44.6 % (ref 35.0–45.0)
Hemoglobin: 14.3 g/dL (ref 11.7–15.5)
MCH: 30.7 pg (ref 27.0–33.0)
MCHC: 32.1 g/dL (ref 32.0–36.0)
MCV: 95.7 fL (ref 80.0–100.0)
MPV: 10.3 fL (ref 7.5–12.5)
Platelets: 321 10*3/uL (ref 140–400)
RBC: 4.66 10*6/uL (ref 3.80–5.10)
RDW: 11.9 % (ref 11.0–15.0)
WBC: 5.6 10*3/uL (ref 3.8–10.8)

## 2019-07-07 LAB — TSH: TSH: 2.11 mIU/L (ref 0.40–4.50)

## 2019-07-07 LAB — LIPID PANEL
Cholesterol: 224 mg/dL — ABNORMAL HIGH (ref <200)
HDL: 49 mg/dL — ABNORMAL LOW (ref >=50)
LDL Cholesterol (Calc): 143 mg/dL (calc) — ABNORMAL HIGH
Non-HDL Cholesterol (Calc): 175 mg/dL (calc) — ABNORMAL HIGH (ref <130)
Total CHOL/HDL Ratio: 4.6 (calc) (ref <5.0)
Triglycerides: 185 mg/dL — ABNORMAL HIGH (ref <150)

## 2019-07-07 LAB — HEMOGLOBIN A1C
Hgb A1c MFr Bld: 5.3 % of total Hgb (ref <5.7)
Mean Plasma Glucose: 105 (calc)
eAG (mmol/L): 5.8 (calc)

## 2019-07-07 MED ORDER — PANTOPRAZOLE SODIUM 20 MG PO TBEC: 20.0000 mg | DELAYED_RELEASE_TABLET | Freq: Every day | ORAL | 0 refills | Status: DC

## 2019-07-07 NOTE — Patient Instructions (Addendum)
Telephone visit with MD re heartburn and re cheeck weight and anxiety in 3 monrths, call if you need me sooner  EXCELLENT labs and exam except for cholesterol and weight  New medication for heartburn is once daily pantoprazole   DECREASE time spent with unpleasant situations and intentionally plan activities, though currently limited that you enjoy and that help you  Thanks for choosing [facility], we consider it a privelige to serve you.   Living With Anxiety  After being diagnosed with an anxiety disorder, you may be relieved to know why you have felt or behaved a certain way. It is natural to also feel overwhelmed about the treatment ahead and what it will mean for your life. With care and support, you can manage this condition and recover from it. How to cope with anxiety Dealing with stress Stress is your body's reaction to life changes and events, both good and bad. Stress can last just a few hours or it can be ongoing. Stress can play a major role in anxiety, so it is important to learn both how to cope with stress and how to think about it differently. Talk with your health care provider or a counselor to learn more about stress reduction. He or she may suggest some stress reduction techniques, such as:  Music therapy. This can include creating or listening to music that you enjoy and that inspires you.  Mindfulness-based meditation. This involves being aware of your normal breaths, rather than trying to control your breathing. It can be done while sitting or walking.  Centering prayer. This is a kind of meditation that involves focusing on a word, phrase, or sacred image that is meaningful to you and that brings you peace.  Deep breathing. To do this, expand your stomach and inhale slowly through your nose. Hold your breath for 3-5 seconds. Then exhale slowly, allowing your stomach muscles to relax.  Self-talk. This is a skill where you identify thought patterns that  lead to anxiety reactions and correct those thoughts.  Muscle relaxation. This involves tensing muscles then relaxing them. Choose a stress reduction technique that fits your lifestyle and personality. Stress reduction techniques take time and practice. Set aside 5-15 minutes a day to do them. Therapists can offer training in these techniques. The training may be covered by some insurance plans. Other things you can do to manage stress include:  Keeping a stress diary. This can help you learn what triggers your stress and ways to control your response.  Thinking about how you respond to certain situations. You may not be able to control everything, but you can control your reaction.  Making time for activities that help you relax, and not feeling guilty about spending your time in this way. Therapy combined with coping and stress-reduction skills provides the best chance for successful treatment. Medicines Medicines can help ease symptoms. Medicines for anxiety include:  Anti-anxiety drugs.  Antidepressants.  Beta-blockers. Medicines may be used as the main treatment for anxiety disorder, along with therapy, or if other treatments are not working. Medicines should be prescribed by a health care provider. Relationships Relationships can play a big part in helping you recover. Try to spend more time connecting with trusted friends and family members. Consider going to couples counseling, taking family education classes, or going to family therapy. Therapy can help you and others better understand the condition. How to recognize changes in your condition Everyone has a different response to treatment for anxiety. Recovery from anxiety  happens when symptoms decrease and stop interfering with your daily activities at home or work. This may mean that you will start to:  Have better concentration and focus.  Sleep better.  Be less irritable.  Have more energy.  Have improved memory. It is  important to recognize when your condition is getting worse. Contact your health care provider if your symptoms interfere with home or work and you do not feel like your condition is improving. Where to find help and support: You can get help and support from these sources:  Self-help groups.  Online and [employer].  A trusted spiritual leader.  Couples counseling.  Family education classes.  Family therapy. Follow these instructions at home:  Eat a healthy diet that includes plenty of vegetables, fruits, whole grains, low-fat dairy products, and lean protein. Do not eat a lot of foods that are high in solid fats, added sugars, or salt.  Exercise. Most adults should do the following: ? Exercise for at least 150 minutes each week. The exercise should increase your heart rate and make you sweat (moderate-intensity exercise). ? Strengthening exercises at least twice a week.  Cut down on caffeine, tobacco, alcohol, and other potentially harmful substances.  Get the right amount and quality of sleep. Most adults need 7-9 hours of sleep each night.  Make choices that simplify your life.  Take over-the-counter and prescription medicines only as told by your health care provider.  Avoid caffeine, alcohol, and certain over-the-counter cold medicines. These may make you feel worse. Ask your pharmacist which medicines to avoid.  Keep all follow-up visits as told by your health care provider. This is important. Questions to ask your health care provider  Would I benefit from therapy?  How often should I follow up with a health care provider?  How long do I need to take medicine?  Are there any long-term side effects of my medicine?  Are there any alternatives to taking medicine? Contact a health care provider if:  You have a hard time staying focused or finishing daily tasks.  You spend many hours a day feeling worried about everyday life.  You become exhausted by  worry.  You start to have headaches, feel tense, or have nausea.  You urinate more than normal.  You have diarrhea. Get help right away if:  You have a racing heart and shortness of breath.  You have thoughts of hurting yourself or others. If you ever feel like you may hurt yourself or others, or have thoughts about taking your own life, get help right away. You can go to your nearest emergency department or call:  Your local emergency services (911 in the [place]  A suicide crisis helpline, such as the [facility] at (724) 825-7064. This is open 24-hours a day. Summary  Taking steps to deal with stress can help calm you.  Medicines cannot cure anxiety disorders, but they can help ease symptoms.  Family, friends, and partners can play a big part in helping you recover from an anxiety disorder. This information is not intended to replace advice given to you by your health care provider. Make sure you discuss any questions you have with your health care provider. Document Released: 08/06/2016 Document Revised: 07/25/2017 Document Reviewed: 08/06/2016 Elsevier Patient Education  2020 [employer].

## 2019-07-07 NOTE — Assessment & Plan Note (Addendum)
Annual exam as documented. Counseling done  re healthy lifestyle involving commitment to 150 minutes exercise per week, heart healthy diet, and attaining healthy weight.Immunization and cancer screening needs are specifically addressed at this visit.

## 2019-07-07 NOTE — Progress Notes (Signed)
    Yvonne Brewer     MRN: [id number]      DOB: 1946/05/31  HPI: Patient is in for annual physical exam. C/o central; chest discomfort, denies dysphagia, often occurs after eating. Denies nausea, bloating or vomiting Recent labs, if available are reviewed. Immunization is reviewed , and  updated if needed.   PE: BP 122/80   Pulse 87   Temp 98.7 F (37.1 C) (Temporal)   Resp 15   Ht 5\' 3"  (1.6 m)   Wt 181 lb (82.1 kg)   SpO2 98%   BMI 32.06 kg/m   Pleasant  female, alert and oriented x 3, in no cardio-pulmonary distress. Afebrile. HEENT No facial trauma or asymetry. Sinuses non tender.  Extra occullar muscles intact.. External ears normal, . Neck: supple, no adenopathy,JVD or thyromegaly.No bruits.  Chest: Clear to ascultation bilaterally.No crackles or wheezes. Non tender to palpation  Breast: Normal mammogram 04/2019   Cardiovascular system; Heart sounds normal,  S1 and  S2 ,no S3.  No murmur, or thrill. Apical beat not displaced Peripheral pulses normal.  Abdomen: Soft, non tender, no organomegaly or masses. No bruits. Bowel sounds normal. No guarding, tenderness or rebound.   GU: Not examined, asymptomatic.  Musculoskeletal exam: Adequate though reduced  ROM of spine, hips , shoulders and knees. .   Neurologic: Cranial nerves 2 to 12 intact. Power, tone ,sensation  normal throughout. No disturbance in gait. No tremor.  Skin: Intact, no ulceration, erythema , scaling or rash noted. Pigmentation normal throughout  Psych; Normal mood and affect. Judgement and concentration normal   Assessment & Plan:  Annual physical exam Annual exam as documented. Counseling done  re healthy lifestyle involving commitment to 150 minutes exercise per week, heart healthy diet, and attaining healthy weight.Immunization and cancer screening needs are specifically addressed at this visit.   Obesity (BMI 30.0-34.9)  Patient re-educated about  the importance  of commitment to a  minimum of 150 minutes of exercise per week as able.  The importance of healthy food choices with portion control discussed, as well as eating regularly and within a 12 hour window most days. The need to choose "clean , green" food 50 to 75% of the time is discussed, as well as to make water the primary drink and set a goal of 64 ounces water daily.    Weight /BMI 07/07/2019 04/05/2019 01/04/2019  WEIGHT 181 lb 181 lb 1.9 oz 186 lb  HEIGHT 5\' 3"  5' 2.5" 5\' 2"   BMI 32.06 kg/m2 32.6 kg/m2 34.02 kg/m2      Heartburn Chronic heartburn, denies dysphagia. Trial of PPI, also  Pt to work on weight loss, and avoid caffeine Will refer to gI if symptoms persist  Anxiety Mild to  Moderate anxiety related to  current health crisis, and it's implications re lifestyle, encoouraged and discussed behavioral changes to cope with stress, will review in next 2 to 3 months

## 2019-07-10 ENCOUNTER — Encounter: Payer: Self-pay | Admitting: Family Medicine

## 2019-07-10 DIAGNOSIS — F419 Anxiety disorder, unspecified: Secondary | ICD-10-CM | POA: Insufficient documentation

## 2019-07-10 NOTE — Assessment & Plan Note (Signed)
Chronic heartburn, denies dysphagia. Trial of PPI, also  Pt to work on weight loss, and avoid caffeine Will refer to gI if symptoms persist

## 2019-07-10 NOTE — Assessment & Plan Note (Signed)
Mild to  Moderate anxiety related to  current health crisis, and it's implications re lifestyle, encoouraged and discussed behavioral changes to cope with stress, will review in next 2 to 3 months

## 2019-07-10 NOTE — Assessment & Plan Note (Signed)
  Patient re-educated about  the importance of commitment to a  minimum of 150 minutes of exercise per week as able.  The importance of healthy food choices with portion control discussed, as well as eating regularly and within a 12 hour window most days. The need to choose "clean , green" food 50 to 75% of the time is discussed, as well as to make water the primary drink and set a goal of 64 ounces water daily.    Weight /BMI 07/07/2019 04/05/2019 01/04/2019  WEIGHT 181 lb 181 lb 1.9 oz 186 lb  HEIGHT 5\' 3"  5' 2.5" 5\' 2"   BMI 32.06 kg/m2 32.6 kg/m2 34.02 kg/m2

## 2019-07-27 DIAGNOSIS — L820 Inflamed seborrheic keratosis: Secondary | ICD-10-CM | POA: Diagnosis not present

## 2019-07-27 DIAGNOSIS — D225 Melanocytic nevi of trunk: Secondary | ICD-10-CM | POA: Diagnosis not present

## 2019-07-27 DIAGNOSIS — B078 Other viral warts: Secondary | ICD-10-CM | POA: Diagnosis not present

## 2019-08-11 ENCOUNTER — Telehealth: Payer: Self-pay

## 2019-08-11 ENCOUNTER — Other Ambulatory Visit: Payer: Self-pay

## 2019-08-11 MED ORDER — AMLODIPINE BESYLATE 5 MG PO TABS: 5.0000 mg | ORAL_TABLET | Freq: Every day | ORAL | 0 refills | Status: DC

## 2019-08-11 NOTE — Telephone Encounter (Signed)
Prescription refilled and sent to pharmacy.

## 2019-08-11 NOTE — Telephone Encounter (Signed)
Patient called and is requesting refill on amLODipine.  She said the pharmacy does not have refills on the current rx.  Please send to [facility].

## 2019-08-12 ENCOUNTER — Ambulatory Visit (INDEPENDENT_AMBULATORY_CARE_PROVIDER_SITE_OTHER): Payer: Medicare HMO | Admitting: Family Medicine

## 2019-08-12 ENCOUNTER — Encounter: Payer: Self-pay | Admitting: Family Medicine

## 2019-08-12 ENCOUNTER — Other Ambulatory Visit: Payer: Self-pay

## 2019-08-12 VITALS — BP 122/80 | HR 87 | Resp 15 | Ht 63.0 in | Wt 181.0 lb

## 2019-08-12 DIAGNOSIS — Z0000 Encounter for general adult medical examination without abnormal findings: Secondary | ICD-10-CM

## 2019-08-12 NOTE — Progress Notes (Signed)
Subjective:   Yvonne Brewer is a 73 y.o. female who presents for Medicare Annual (Subsequent) preventive examination.  Location of Patient: Home Location of Provider: Telehealth Consent was obtain for visit to be over via telehealth. I verified that I am speaking with the correct person using two identifiers.  Review of Systems:    Cardiac Risk Factors include: advanced age (>25men, >36 women);dyslipidemia;hypertension;obesity (BMI >30kg/m2)     Objective:     Vitals: BP 122/80   Pulse 87   Resp 15   Ht 5\' 3"  (1.6 m)   Wt 181 lb (82.1 kg)   BMI 32.06 kg/m   Body mass index is 32.06 kg/m.  Advanced Directives 08/10/2018 05/21/2017 05/16/2016 03/08/2014 11/06/2011  Does Patient Have a Medical Advance Directive? No Yes No Patient would like information Patient has advance directive, copy not in chart  Type of Advance Directive - [place];Living will - - Living will  Copy of [place] in Chart? - - - - Copy requested from family  Would patient like information on creating a medical advance directive? Yes (ED - Information included in AVS) - - - -  Pre-existing out of facility DNR order (yellow form or pink MOST form) - - - - No    Tobacco Social History   Tobacco Use  Smoking Status Never Smoker  Smokeless Tobacco Never Used     Counseling given: Yes   Clinical Intake:  Pre-visit preparation completed: Yes  Pain : No/denies pain Pain Score: 0-No pain     BMI - recorded: 32.06 Nutritional Status: BMI > 30  Obese Nutritional Risks: None Diabetes: No  How often do you need to have someone help you when you read instructions, pamphlets, or other written materials from your doctor or pharmacy?: 1 - Never What is the last grade level you completed in school?: masters degree  Interpreter Needed?: No     Past Medical History:  Diagnosis Date  . Essential hypertension 2015  . Female bladder prolapse   . History of  cystitis   . History of skin cancer   . Hyperlipidemia   . Obesity    Past Surgical History:  Procedure Laterality Date  . CATARACT EXTRACTION  2012   bilateral   . COLONOSCOPY  11/06/2011   Procedure: COLONOSCOPY;  Surgeon: Rogene Houston, MD;  [facility];  Service: Endoscopy;  Laterality: N/A;  7:30  . COLONOSCOPY N/A 05/21/2017   Procedure: COLONOSCOPY;  Surgeon: Rogene Houston, MD;  [facility];  Service: Endoscopy;  Laterality: N/A;  730-moved to 9/26 @10 :30am per Lelon Frohlich  . EYE SURGERY  2011   bilateral cataract with implants  . HAMMER TOE SURGERY Left 01/2016  . Left bunionectomy    . Squamous cell ca rt ant chest 1989    . YAG LASER APPLICATION Left [id number]   Procedure: YAG LASER APPLICATION;  Surgeon: Rutherford Guys, MD;  Location: [facility];  Service: Ophthalmology;  Laterality: Left;  . YAG LASER APPLICATION Right [id number]   Procedure: YAG LASER APPLICATION;  Surgeon: Rutherford Guys, MD;  Location: [facility];  Service: Ophthalmology;  Laterality: Right;   Family History  Problem Relation Age of Onset  . Stroke Mother   . Hypertension Mother   . Heart disease Father   . Cancer Maternal Grandmother   . Anesthesia problems Neg Hx   . Hypotension Neg Hx   . Malignant hyperthermia Neg Hx   . Pseudochol deficiency  Neg Hx    Social History   Socioeconomic History  . Marital status: Married    Spouse name: Edison Nasuti   . Number of children: 2  . Years of education: 75  . Highest education level: Master's degree (e.g., MA, MS, MEng, MEd, MSW, MBA)  Occupational History  . Not on file  Tobacco Use  . Smoking status: Never Smoker  . Smokeless tobacco: Never Used  Substance and Sexual Activity  . Alcohol use: Yes    Comment: glass of red wine most nights or scotch and water  . Drug use: No  . Sexual activity: Not Currently    Birth control/protection: Post-menopausal  Other Topics Concern  . Not on file  Social History Narrative   Lives alone with   husband    Social Determinants of Health   Financial Resource Strain:   . Difficulty of Paying Living Expenses: Not on file  Food Insecurity:   . Worried About Charity fundraiser in the Last Year: Not on file  . Ran Out of Food in the Last Year: Not on file  Transportation Needs:   . Lack of Transportation (Medical): Not on file  . Lack of Transportation (Non-Medical): Not on file  Physical Activity:   . Days of Exercise per Week: Not on file  . Minutes of Exercise per Session: Not on file  Stress:   . Feeling of Stress : Not on file  Social Connections:   . Frequency of Communication with Friends and Family: Not on file  . Frequency of Social Gatherings with Friends and Family: Not on file  . Attends Religious Services: Not on file  . Active Member of Clubs or Organizations: Not on file  . Attends Archivist Meetings: Not on file  . Marital Status: Not on file    Outpatient Encounter Medications as of 08/12/2019  Medication Sig  . amLODipine (NORVASC) 5 MG tablet Take 1 tablet (5 mg total) by mouth daily.  . calcium elemental as carbonate (BARIATRIC TUMS ULTRA) 400 MG chewable tablet Chew 1,000 mg by mouth 3 (three) times daily.  . clotrimazole-betamethasone (LOTRISONE) cream APPLY TWO TIMES DAILY TO DRY SCALY , ITCHY RASHES FOR 1 WEEK, THEN , AS NEEDED  . pantoprazole (PROTONIX) 20 MG tablet Take 1 tablet (20 mg total) by mouth daily.   No facility-administered encounter medications on file as of 08/12/2019.    Activities of Daily Living In your present state of health, do you have any difficulty performing the following activities: 08/12/2019  Hearing? N  Vision? N  Difficulty concentrating or making decisions? N  Walking or climbing stairs? N  Dressing or bathing? N  Doing errands, shopping? N  Preparing Food and eating ? N  Using the Toilet? N  In the past six months, have you accidently leaked urine? N  Do you have problems with loss of bowel control? N   Managing your Medications? N  Managing your Finances? N  Housekeeping or managing your Housekeeping? N  Some recent data might be hidden    Patient Care Team: Fayrene Helper, MD as PCP - General Allyn Kenner, MD (Dermatology) Rogene Houston, MD as Consulting Physician (Gastroenterology) Sanjuana Kava, MD as Consulting Physician (Orthopedic Surgery) Rutherford Guys, MD as Consulting Physician (Ophthalmology) Wylene Simmer, MD as Consulting Physician (Orthopedic Surgery)    Assessment:   This is a routine wellness examination for [facility].  Exercise Activities and Dietary recommendations Current Exercise Habits: Home exercise routine, Time (Minutes):  45, Frequency (Times/Week): 3, Weekly Exercise (Minutes/Week): 135, Intensity: Moderate, Exercise limited by: None identified  Goals    . Weight (lb) < 200 lb (90.7 kg)       Fall Risk Fall Risk  08/12/2019 07/07/2019 04/05/2019 01/04/2019 10/19/2018  Falls in the past year? 0 0 0 1 0  Number falls in past yr: 0 0 - 0 -  Injury with Fall? 0 0 0 0 -  Risk for fall due to : - - - - -  Follow up Falls evaluation completed;Education provided;Falls prevention discussed - - - -   Is the patient's home free of loose throw rugs in walkways, pet beds, electrical cords, etc?   yes      Grab bars in the bathroom? yes      Handrails on the stairs?   yes      Adequate lighting?   yes     Depression Screen PHQ 2/9 Scores 08/12/2019 07/07/2019 04/05/2019 08/10/2018  PHQ - 2 Score 0 0 0 0  PHQ- 9 Score - - - -     Cognitive Function MMSE - Mini Mental State Exam 07/29/2016  Orientation to time 5  Orientation to Place 5  Registration 3  Attention/ Calculation 5  Recall 3  Language- name 2 objects 2  Language- repeat 1  Language- follow 3 step command 3  Language- read & follow direction 1  Write a sentence 1  Copy design 1  Total score 30     6CIT Screen 08/12/2019 08/10/2018  What Year? 0 points 0 points  What month? 0 points  0 points  What time? 0 points 0 points  Count back from 20 0 points 0 points  Months in reverse 0 points 0 points  Repeat phrase 0 points 2 points  Total Score 0 2    Immunization History  Administered Date(s) Administered  . Fluad Quad(high Dose 65+) 04/19/2019  . Hep A / Hep B 03/09/2018  . Hepatitis A 12/29/2002, 09/20/2003  . Influenza Split 05/23/2014  . Influenza Whole 10/13/2008, 06/06/2009  . Influenza-Unspecified 05/16/2017, 05/26/2018  . Pneumococcal Conjugate-13 09/06/2014  . Pneumococcal Polysaccharide-23 12/31/2010  . Td 12/15/2002  . Typhoid Live 03/09/2018  . Zoster 09/01/2006  . Zoster Recombinat (Shingrix) 07/09/2019    Qualifies for Shingles Vaccine?  In process  Screening Tests Health Maintenance  Topic Date Due  . TETANUS/TDAP  07/09/2020 (Originally 12/14/2012)  . MAMMOGRAM  05/04/2021  . COLONOSCOPY  05/21/2022  . INFLUENZA VACCINE  Completed  . DEXA SCAN  Completed  . Hepatitis C Screening  Completed  . PNA vac Low Risk Adult  Completed    Cancer Screenings: Lung: Low Dose CT Chest recommended if Age 87-80 years, 30 pack-year currently smoking OR have quit w/in 15years. Patient does not qualify. Breast:  Up to date on Mammogram? Yes   Up to date of Bone Density/Dexa? Yes Colorectal:  Due 2023  Additional Screenings:  Hepatitis C Screening:  completed     Plan:      1. Encounter for Medicare annual wellness exam   I have personally reviewed and noted the following in the patient's chart:   . Medical and social history . Use of alcohol, tobacco or illicit drugs  . Current medications and supplements . Functional ability and status . Nutritional status . Physical activity . Advanced directives . List of other physicians . Hospitalizations, surgeries, and ER visits in previous 12 months . Vitals . Screenings to include cognitive, depression, and  falls . Referrals and appointments  In addition, I have reviewed and discussed with  patient certain preventive protocols, quality metrics, and best practice recommendations. A written personalized care plan for preventive services as well as general preventive health recommendations were provided to patient.    I provided 20 minutes of non-face-to-face time during this encounter.   Perlie Mayo, NP  08/12/2019

## 2019-08-12 NOTE — Patient Instructions (Addendum)
Yvonne Brewer , Thank you for taking time to come for your Medicare Wellness Visit. I appreciate your ongoing commitment to your health goals. Please review the following plan we discussed and let me know if I can assist you in the future.   Please continue to practice social distancing to keep you, your family, and our community safe.  If you must go out, please wear a Mask and practice good handwashing.  Merry Christmas and Happy New Year :)   Screening recommendations/referrals: Colonoscopy: up to date Mammogram: up to date Bone Density: up to date  Recommended yearly ophthalmology/optometry visit for glaucoma screening and checkup Recommended yearly dental visit for hygiene and checkup  Vaccinations: Influenza vaccine: up to date Pneumococcal vaccine: up to date Tdap vaccine: up to date Shingles vaccine: in process (2-6 months is the length of time to get the second part of this vaccine)  You got it on 07/09/2019  Advanced directives:  completed  Conditions/risks identified: Falls,   Next appointment: 10/13/2019  Preventive Care 3 Years and Older, Female Preventive care refers to lifestyle choices and visits with your health care provider that can promote health and wellness. What does preventive care include?  A yearly physical exam. This is also called an annual well check.  Dental exams once or twice a year.  Routine eye exams. Ask your health care provider how often you should have your eyes checked.  Personal lifestyle choices, including:  Daily care of your teeth and gums.  Regular physical activity.  Eating a healthy diet.  Avoiding tobacco and drug use.  Limiting alcohol use.  Practicing safe sex.  Taking low-dose aspirin every day.  Taking vitamin and mineral supplements as recommended by your health care provider. What happens during an annual well check? The services and screenings done by your health care provider during your annual well check  will depend on your age, overall health, lifestyle risk factors, and family history of disease. Counseling  Your health care provider may ask you questions about your:  Alcohol use.  Tobacco use.  Drug use.  Emotional well-being.  Home and relationship well-being.  Sexual activity.  Eating habits.  History of falls.  Memory and ability to understand (cognition).  Work and work Statistician.  Reproductive health. Screening  You may have the following tests or measurements:  Height, weight, and BMI.  Blood pressure.  Lipid and cholesterol levels. These may be checked every 5 years, or more frequently if you are over 92 years old.  Skin check.  Lung cancer screening. You may have this screening every year starting at age 19 if you have a 30-pack-year history of smoking and currently smoke or have quit within the past 15 years.  Fecal occult blood test (FOBT) of the stool. You may have this test every year starting at age 67.  Flexible sigmoidoscopy or colonoscopy. You may have a sigmoidoscopy every 5 years or a colonoscopy every 10 years starting at age 37.  Hepatitis C blood test.  Hepatitis B blood test.  Sexually transmitted disease (STD) testing.  Diabetes screening. This is done by checking your blood sugar (glucose) after you have not eaten for a while (fasting). You may have this done every 1-3 years.  Bone density scan. This is done to screen for osteoporosis. You may have this done starting at age 24.  Mammogram. This may be done every 1-2 years. Talk to your health care provider about how often you should have regular mammograms. Talk with  your health care provider about your test results, treatment options, and if necessary, the need for more tests. Vaccines  Your health care provider may recommend certain vaccines, such as:  Influenza vaccine. This is recommended every year.  Tetanus, diphtheria, and acellular pertussis (Tdap, Td) vaccine. You may  need a Td booster every 10 years.  Zoster vaccine. You may need this after age 68.  Pneumococcal 13-valent conjugate (PCV13) vaccine. One dose is recommended after age 15.  Pneumococcal polysaccharide (PPSV23) vaccine. One dose is recommended after age 64. Talk to your health care provider about which screenings and vaccines you need and how often you need them. This information is not intended to replace advice given to you by your health care provider. Make sure you discuss any questions you have with your health care provider. Document Released: 09/08/2015 Document Revised: 05/01/2016 Document Reviewed: 06/13/2015 Elsevier Interactive Patient Education  2017 [place] Prevention in the Home Falls can cause injuries. They can happen to people of all ages. There are many things you can do to make your home safe and to help prevent falls. What can I do on the outside of my home?  Regularly fix the edges of walkways and driveways and fix any cracks.  Remove anything that might make you trip as you walk through a door, such as a raised step or threshold.  Trim any bushes or trees on the path to your home.  Use bright outdoor lighting.  Clear any walking paths of anything that might make someone trip, such as rocks or tools.  Regularly check to see if handrails are loose or broken. Make sure that both sides of any steps have handrails.  Any raised decks and porches should have guardrails on the edges.  Have any leaves, snow, or ice cleared regularly.  Use sand or salt on walking paths during winter.  Clean up any spills in your garage right away. This includes oil or grease spills. What can I do in the bathroom?  Use night lights.  Install grab bars by the toilet and in the tub and shower. Do not use towel bars as grab bars.  Use non-skid mats or decals in the tub or shower.  If you need to sit down in the shower, use a plastic, non-slip stool.  Keep the floor  dry. Clean up any water that spills on the floor as soon as it happens.  Remove soap buildup in the tub or shower regularly.  Attach bath mats securely with double-sided non-slip rug tape.  Do not have throw rugs and other things on the floor that can make you trip. What can I do in the bedroom?  Use night lights.  Make sure that you have a light by your bed that is easy to reach.  Do not use any sheets or blankets that are too big for your bed. They should not hang down onto the floor.  Have a firm chair that has side arms. You can use this for support while you get dressed.  Do not have throw rugs and other things on the floor that can make you trip. What can I do in the kitchen?  Clean up any spills right away.  Avoid walking on wet floors.  Keep items that you use a lot in easy-to-reach places.  If you need to reach something above you, use a strong step stool that has a grab bar.  Keep electrical cords out of the way.  Do not  use floor polish or wax that makes floors slippery. If you must use wax, use non-skid floor wax.  Do not have throw rugs and other things on the floor that can make you trip. What can I do with my stairs?  Do not leave any items on the stairs.  Make sure that there are handrails on both sides of the stairs and use them. Fix handrails that are broken or loose. Make sure that handrails are as long as the stairways.  Check any carpeting to make sure that it is firmly attached to the stairs. Fix any carpet that is loose or worn.  Avoid having throw rugs at the top or bottom of the stairs. If you do have throw rugs, attach them to the floor with carpet tape.  Make sure that you have a light switch at the top of the stairs and the bottom of the stairs. If you do not have them, ask someone to add them for you. What else can I do to help prevent falls?  Wear shoes that:  Do not have high heels.  Have rubber bottoms.  Are comfortable and fit you  well.  Are closed at the toe. Do not wear sandals.  If you use a stepladder:  Make sure that it is fully opened. Do not climb a closed stepladder.  Make sure that both sides of the stepladder are locked into place.  Ask someone to hold it for you, if possible.  Clearly mark and make sure that you can see:  Any grab bars or handrails.  First and last steps.  Where the edge of each step is.  Use tools that help you move around (mobility aids) if they are needed. These include:  Canes.  Walkers.  Scooters.  Crutches.  Turn on the lights when you go into a dark area. Replace any light bulbs as soon as they burn out.  Set up your furniture so you have a clear path. Avoid moving your furniture around.  If any of your floors are uneven, fix them.  If there are any pets around you, be aware of where they are.  Review your medicines with your doctor. Some medicines can make you feel dizzy. This can increase your chance of falling. Ask your doctor what other things that you can do to help prevent falls. This information is not intended to replace advice given to you by your health care provider. Make sure you discuss any questions you have with your health care provider. Document Released: 06/08/2009 Document Revised: 01/18/2016 Document Reviewed: 09/16/2014 Elsevier Interactive Patient Education  2017 [employer].

## 2019-08-13 ENCOUNTER — Ambulatory Visit: Payer: Medicare HMO

## 2019-09-16 ENCOUNTER — Ambulatory Visit: Payer: BC Managed Care – PPO | Attending: Internal Medicine

## 2019-09-16 DIAGNOSIS — Z23 Encounter for immunization: Secondary | ICD-10-CM | POA: Insufficient documentation

## 2019-09-16 NOTE — Progress Notes (Signed)
   Covid-19 Vaccination Clinic  Name:  Yvonne Brewer    MRN: [id number] DOB: Mar 21, 1946  09/16/2019  Yvonne Brewer was observed post Covid-19 immunization for 15 minutes without incidence. She was provided with Vaccine Information Sheet and instruction to access the V-Safe system.   Yvonne Brewer was instructed to call 911 with any severe reactions post vaccine: Marland Kitchen Difficulty breathing  . Swelling of your face and throat  . A fast heartbeat  . A bad rash all over your body  . Dizziness and weakness    Immunizations Administered    Name Date Dose VIS Date Route   Pfizer COVID-19 Vaccine 09/16/2019  4:04 PM 0.3 mL 08/06/2019 Intramuscular   Manufacturer: [place]   Lot: [id number]   [place]: [id number]

## 2019-10-07 ENCOUNTER — Ambulatory Visit: Payer: BC Managed Care – PPO | Attending: Internal Medicine

## 2019-10-07 DIAGNOSIS — Z23 Encounter for immunization: Secondary | ICD-10-CM

## 2019-10-07 NOTE — Progress Notes (Signed)
   Covid-19 Vaccination Clinic  Name:  Yvonne Brewer    MRN: [id number] DOB: 03-29-1946  10/07/2019  [name] was observed post Covid-19 immunization for 15 minutes without incidence. She was provided with Vaccine Information Sheet and instruction to access the V-Safe system.   [name] was instructed to call 911 with any severe reactions post vaccine: Marland Kitchen Difficulty breathing  . Swelling of your face and throat  . A fast heartbeat  . A bad rash all over your body  . Dizziness and weakness    Immunizations Administered    Name Date Dose VIS Date Route   Pfizer COVID-19 Vaccine 10/07/2019  5:11 PM 0.3 mL 08/06/2019 Intramuscular   Manufacturer: [place]   Lot: [id number]   [place]: [id number]

## 2019-10-13 ENCOUNTER — Ambulatory Visit: Payer: Medicare HMO | Admitting: Family Medicine

## 2019-11-05 ENCOUNTER — Other Ambulatory Visit: Payer: Self-pay | Admitting: Family Medicine

## 2019-11-05 ENCOUNTER — Telehealth: Payer: Self-pay

## 2019-11-05 ENCOUNTER — Other Ambulatory Visit: Payer: Self-pay

## 2019-11-05 MED ORDER — AMLODIPINE BESYLATE 5 MG PO TABS: 5.0000 mg | ORAL_TABLET | Freq: Every day | ORAL | 0 refills | Status: DC

## 2019-11-05 NOTE — Telephone Encounter (Signed)
Please sent [facility] to [facility] ph# [phone] Please fax to [phone]

## 2019-11-05 NOTE — Telephone Encounter (Signed)
[facility]

## 2019-11-18 ENCOUNTER — Other Ambulatory Visit: Payer: Self-pay | Admitting: Family Medicine

## 2019-11-29 ENCOUNTER — Other Ambulatory Visit: Payer: Self-pay | Admitting: Family Medicine

## 2019-12-17 ENCOUNTER — Other Ambulatory Visit: Payer: Self-pay | Admitting: *Deleted

## 2019-12-17 ENCOUNTER — Telehealth: Payer: Self-pay

## 2019-12-17 MED ORDER — PANTOPRAZOLE SODIUM 20 MG PO TBEC: 20.0000 mg | DELAYED_RELEASE_TABLET | Freq: Every day | ORAL | 3 refills | Status: DC

## 2019-12-17 NOTE — Telephone Encounter (Signed)
Pt stopped by to get a refill on her Pantoprazole sodium 20 mg , [facility] to [facility] [address] , [address]

## 2019-12-17 NOTE — Telephone Encounter (Signed)
Medications sent to the pharmacy.

## 2020-03-21 ENCOUNTER — Other Ambulatory Visit: Payer: Self-pay

## 2020-04-01 ENCOUNTER — Other Ambulatory Visit: Payer: Self-pay | Admitting: Family Medicine

## 2020-04-03 NOTE — Telephone Encounter (Signed)
Will you change patients Protonix to Prilosec due to cost?

## 2020-04-07 ENCOUNTER — Other Ambulatory Visit: Payer: Self-pay | Admitting: Family Medicine

## 2020-04-07 MED ORDER — OMEPRAZOLE 20 MG PO CPDR
20.0000 mg | DELAYED_RELEASE_CAPSULE | Freq: Every day | ORAL | 1 refills | Status: DC
Start: 2020-04-07 — End: 2020-08-15

## 2020-04-13 ENCOUNTER — Telehealth: Payer: Self-pay | Admitting: Orthopaedic Surgery

## 2020-04-13 NOTE — Telephone Encounter (Signed)
Patient called to relay new problem of shoulder pain; requested appointment with Dr Luna Glasgow. Relayed that Dr is now out of clinic until 04/25/20. Discussed option of primary care, urgent care; patient asked if Dr Aline Brochure may have availability any sooner, which we discussed he does not, unfortunately. Patient opted to schedule for 04/25/20 with  Dr Luna Glasgow. Aware we are keeping on wait list as well.

## 2020-04-25 ENCOUNTER — Ambulatory Visit (INDEPENDENT_AMBULATORY_CARE_PROVIDER_SITE_OTHER): Payer: PPO | Admitting: Orthopaedic Surgery

## 2020-04-25 ENCOUNTER — Other Ambulatory Visit: Payer: Self-pay

## 2020-04-25 ENCOUNTER — Ambulatory Visit: Payer: BC Managed Care – PPO

## 2020-04-25 ENCOUNTER — Encounter: Payer: Self-pay | Admitting: Orthopaedic Surgery

## 2020-04-25 VITALS — BP 154/85 | HR 78 | Ht 62.0 in | Wt 185.0 lb

## 2020-04-25 DIAGNOSIS — M25512 Pain in left shoulder: Secondary | ICD-10-CM

## 2020-04-25 DIAGNOSIS — G8929 Other chronic pain: Secondary | ICD-10-CM

## 2020-04-25 NOTE — Progress Notes (Signed)
Patient Yvonne Brewer, female DOB:[birth year], 74 y.o. ENI:[id number]  Chief Complaint  Patient presents with  . Shoulder Pain    left  . Arm Pain    left/ from shoulder to elbow     HPI  Yvonne Brewer is a 74 y.o. female who has had increasing pain of the left shoulder over the last two months or so.  She has no trauma.  She is awakened at times with shoulder pain.  She has pain with extension.  She has no redness,no swelling, no numbness.   Body mass index is 33.84 kg/m.  ROS  Review of Systems  Musculoskeletal: Positive for arthralgias, gait problem and joint swelling.  All other systems reviewed and are negative.   All other systems reviewed and are negative.  The following is a summary of the past history medically, past history surgically, known current medicines, social history and family history.  This information is gathered electronically by the computer from prior information and documentation.  I review this each visit and have found including this information at this point in the chart is beneficial and informative.    Past Medical History:  Diagnosis Date  . Essential hypertension 2015  . Female bladder prolapse   . History of cystitis   . History of skin cancer   . Hyperlipidemia   . Obesity     Past Surgical History:  Procedure Laterality Date  . CATARACT EXTRACTION  2012   bilateral   . COLONOSCOPY  11/06/2011   Procedure: COLONOSCOPY;  Surgeon: Rogene Houston, MD;  Location: [facility];  Service: Endoscopy;  Laterality: N/A;  7:30  . COLONOSCOPY N/A 05/21/2017   Procedure: COLONOSCOPY;  Surgeon: Rogene Houston, MD;  Location: [facility];  Service: Endoscopy;  Laterality: N/A;  730-moved to 9/26 @10 :30am per Lelon Frohlich  . EYE SURGERY  2011   bilateral cataract with implants  . HAMMER TOE SURGERY Left 01/2016  . Left bunionectomy    . Squamous cell ca rt ant chest 1989    . YAG LASER APPLICATION Left 02/06/4314   Procedure: YAG LASER  APPLICATION;  Surgeon: Rutherford Guys, MD;  Location: [facility];  Service: Ophthalmology;  Laterality: Left;  . YAG LASER APPLICATION Right 4/00/8676   Procedure: YAG LASER APPLICATION;  Surgeon: Rutherford Guys, MD;  Location: [facility];  Service: Ophthalmology;  Laterality: Right;    Family History  Problem Relation Age of Onset  . Stroke Mother   . Hypertension Mother   . Heart disease Father   . Cancer Maternal Grandmother   . Anesthesia problems Neg Hx   . Hypotension Neg Hx   . Malignant hyperthermia Neg Hx   . Pseudochol deficiency Neg Hx     Social History Social History   Tobacco Use  . Smoking status: Never Smoker  . Smokeless tobacco: Never Used  Vaping Use  . Vaping Use: Never used  Substance Use Topics  . Alcohol use: Yes    Comment: glass of red wine most nights or scotch and water  . Drug use: No    Allergies  Allergen Reactions  . Ace Inhibitors Cough    Cough  . Penicillins Hives    Has patient had a PCN reaction causing immediate rash, facial/tongue/throat swelling, SOB or lightheadedness with hypotension: No Has patient had a PCN reaction causing severe rash involving mucus membranes or skin necrosis: No Has patient had a PCN reaction that required hospitalization No Has patient had a PCN reaction  occurring within the last 10 years: No If all of the above answers are "NO", then may proceed with Cephalosporin use.   . Sulfonamide Derivatives Hives    Current Outpatient Medications  Medication Sig Dispense Refill  . amLODipine (NORVASC) 5 MG tablet TAKE 1 TABLET BY MOUTH EVERY DAY 30 tablet 5  . calcium elemental as carbonate (BARIATRIC TUMS ULTRA) 400 MG chewable tablet Chew 1,000 mg by mouth 3 (three) times daily.    . clotrimazole-betamethasone (LOTRISONE) cream APPLY TWO TIMES DAILY TO DRY SCALY , ITCHY RASHES FOR 1 WEEK, THEN , AS NEEDED 45 g 0  . omeprazole (PRILOSEC) 20 MG capsule Take 1 capsule (20 mg total) by mouth daily. 90 capsule 1   No current  facility-administered medications for this visit.     Physical Exam  Blood pressure (!) 154/85, pulse 78, height 5\' 2"  (1.575 m), weight 185 lb (83.9 kg).  Constitutional: overall normal hygiene, normal nutrition, well developed, normal grooming, normal body habitus. Assistive device:none  Musculoskeletal: gait and station Limp none, muscle tone and strength are normal, no tremors or atrophy is present.  .  Neurological: coordination overall normal.  Deep tendon reflex/nerve stretch intact.  Sensation normal.  Cranial nerves II-XII intact.   Skin:   Normal overall no scars, lesions, ulcers or rashes. No psoriasis.  Psychiatric: Alert and oriented x 3.  Recent memory intact, remote memory unclear.  Normal mood and affect. Well groomed.  Good eye contact.  Cardiovascular: overall no swelling, no varicosities, no edema bilaterally, normal temperatures of the legs and arms, no clubbing, cyanosis and good capillary refill.  Lymphatic: palpation is normal.  Examination of left Upper Extremity is done.  Inspection:   Overall:  Elbow non-tender without crepitus or defects, forearm non-tender without crepitus or defects, wrist non-tender without crepitus or defects, hand non-tender.    Shoulder: with glenohumeral joint tenderness, without effusion.   Upper arm:  without swelling and tenderness   Range of motion:   Overall:  Full range of motion of the elbow, full range of motion of wrist and full range of motion in fingers.   Shoulder:  left  160 degrees forward flexion; 150 degrees abduction; 35 degrees internal rotation, 35 degrees external rotation, 15 degrees extension, 40 degrees adduction.   Stability:   Overall:  Shoulder, elbow and wrist stable   Strength and Tone:   Overall full shoulder muscles strength, full upper arm strength and normal upper arm bulk and tone. All other systems reviewed and are negative   The patient has been educated about the nature of the problem(s) and  counseled on treatment options.  The patient appeared to understand what I have discussed and is in agreement with it.  Encounter Diagnosis  Name Primary?  . Chronic left shoulder pain Yes  X-rays were done of the left shoulder, reported separately.   PROCEDURE NOTE:  The patient request injection, verbal consent was obtained.  The left shoulder was prepped appropriately after time out was performed.   Sterile technique was observed and injection of 1 cc of Depo-Medrol 40 mg with several cc's of plain xylocaine. Anesthesia was provided by ethyl chloride and a 20-gauge needle was used to inject the shoulder area. A posterior approach was used.  The injection was tolerated well.  A band aid dressing was applied.  The patient was advised to apply ice later today and tomorrow to the injection sight as needed.   PLAN Call if any problems.  Precautions discussed.  Continue current medications.   Return to clinic as needed.   Take one or two Aleve bid pc.   Electronically Signed Sanjuana Kava, MD 8/31/202111:15 AM

## 2020-06-01 ENCOUNTER — Ambulatory Visit (INDEPENDENT_AMBULATORY_CARE_PROVIDER_SITE_OTHER): Payer: PPO

## 2020-06-01 ENCOUNTER — Other Ambulatory Visit: Payer: Self-pay

## 2020-06-01 ENCOUNTER — Other Ambulatory Visit: Payer: Self-pay | Admitting: *Deleted

## 2020-06-01 ENCOUNTER — Telehealth: Payer: Self-pay

## 2020-06-01 DIAGNOSIS — Z23 Encounter for immunization: Secondary | ICD-10-CM | POA: Diagnosis not present

## 2020-06-01 DIAGNOSIS — E559 Vitamin D deficiency, unspecified: Secondary | ICD-10-CM

## 2020-06-01 DIAGNOSIS — I10 Essential (primary) hypertension: Secondary | ICD-10-CM

## 2020-06-01 DIAGNOSIS — Z131 Encounter for screening for diabetes mellitus: Secondary | ICD-10-CM

## 2020-06-01 DIAGNOSIS — E785 Hyperlipidemia, unspecified: Secondary | ICD-10-CM

## 2020-06-01 NOTE — Telephone Encounter (Signed)
Yes , needs fasting cBC, lipid, cmp and eGFr, hBA1c, TSH and vit D, pls order and let her know

## 2020-06-01 NOTE — Telephone Encounter (Signed)
LVM for pt letting her know the labs have been ordered

## 2020-06-01 NOTE — Telephone Encounter (Signed)
Pt has an upcoming appointment, wants to know if you want her to have any labs completed.

## 2020-06-13 ENCOUNTER — Other Ambulatory Visit (HOSPITAL_COMMUNITY): Payer: Self-pay | Admitting: Family Medicine

## 2020-06-13 DIAGNOSIS — Z1231 Encounter for screening mammogram for malignant neoplasm of breast: Secondary | ICD-10-CM

## 2020-06-15 ENCOUNTER — Other Ambulatory Visit: Payer: Self-pay | Admitting: Family Medicine

## 2020-06-15 DIAGNOSIS — I10 Essential (primary) hypertension: Secondary | ICD-10-CM | POA: Diagnosis not present

## 2020-06-15 DIAGNOSIS — E785 Hyperlipidemia, unspecified: Secondary | ICD-10-CM | POA: Diagnosis not present

## 2020-06-15 DIAGNOSIS — E559 Vitamin D deficiency, unspecified: Secondary | ICD-10-CM | POA: Diagnosis not present

## 2020-06-15 DIAGNOSIS — Z131 Encounter for screening for diabetes mellitus: Secondary | ICD-10-CM | POA: Diagnosis not present

## 2020-06-16 LAB — CBC
HCT: 41.9 % (ref 35.0–45.0)
Hemoglobin: 13.8 g/dL (ref 11.7–15.5)
MCH: 31.2 pg (ref 27.0–33.0)
MCHC: 32.9 g/dL (ref 32.0–36.0)
MCV: 94.6 fL (ref 80.0–100.0)
MPV: 10.4 fL (ref 7.5–12.5)
Platelets: 291 10*3/uL (ref 140–400)
RBC: 4.43 10*6/uL (ref 3.80–5.10)
RDW: 12 % (ref 11.0–15.0)
WBC: 5.5 10*3/uL (ref 3.8–10.8)

## 2020-06-16 LAB — VITAMIN D 25 HYDROXY (VIT D DEFICIENCY, FRACTURES): Vit D, 25-Hydroxy: 27 ng/mL — ABNORMAL LOW (ref 30–100)

## 2020-06-16 LAB — COMPLETE METABOLIC PANEL WITH GFR
AG Ratio: 1.8 (calc) (ref 1.0–2.5)
ALT: 13 U/L (ref 6–29)
AST: 17 U/L (ref 10–35)
Albumin: 4.2 g/dL (ref 3.6–5.1)
Alkaline phosphatase (APISO): 57 U/L (ref 37–153)
BUN/Creatinine Ratio: 15 (calc) (ref 6–22)
BUN: 14 mg/dL (ref 7–25)
CO2: 29 mmol/L (ref 20–32)
Calcium: 9.5 mg/dL (ref 8.6–10.4)
Chloride: 105 mmol/L (ref 98–110)
Creat: 0.94 mg/dL — ABNORMAL HIGH (ref 0.60–0.93)
GFR, Est African American: 69 mL/min/{1.73_m2} (ref >=60)
GFR, Est Non African American: 60 mL/min/{1.73_m2} (ref >=60)
Globulin: 2.4 g/dL (calc) (ref 1.9–3.7)
Glucose, Bld: 103 mg/dL — ABNORMAL HIGH (ref 65–99)
Potassium: 4.6 mmol/L (ref 3.5–5.3)
Sodium: 141 mmol/L (ref 135–146)
Total Bilirubin: 0.5 mg/dL (ref 0.2–1.2)
Total Protein: 6.6 g/dL (ref 6.1–8.1)

## 2020-06-16 LAB — LIPID PANEL
Cholesterol: 226 mg/dL — ABNORMAL HIGH (ref <200)
HDL: 53 mg/dL (ref >=50)
LDL Cholesterol (Calc): 146 mg/dL (calc) — ABNORMAL HIGH
Non-HDL Cholesterol (Calc): 173 mg/dL (calc) — ABNORMAL HIGH (ref <130)
Total CHOL/HDL Ratio: 4.3 (calc) (ref <5.0)
Triglycerides: 148 mg/dL (ref <150)

## 2020-06-16 LAB — TSH: TSH: 1.68 mIU/L (ref 0.40–4.50)

## 2020-06-16 LAB — HEMOGLOBIN A1C W/OUT EAG: Hgb A1c MFr Bld: 5.4 % of total Hgb (ref <5.7)

## 2020-07-10 ENCOUNTER — Other Ambulatory Visit: Payer: Self-pay

## 2020-07-10 ENCOUNTER — Encounter: Payer: BC Managed Care – PPO | Admitting: Family Medicine

## 2020-07-10 ENCOUNTER — Ambulatory Visit (INDEPENDENT_AMBULATORY_CARE_PROVIDER_SITE_OTHER): Payer: PPO | Admitting: Family Medicine

## 2020-07-10 ENCOUNTER — Ambulatory Visit (HOSPITAL_COMMUNITY): Payer: PPO

## 2020-07-10 VITALS — BP 136/81 | HR 79 | Resp 16 | Ht 62.0 in | Wt 186.0 lb

## 2020-07-10 DIAGNOSIS — Z0000 Encounter for general adult medical examination without abnormal findings: Secondary | ICD-10-CM

## 2020-07-10 DIAGNOSIS — E785 Hyperlipidemia, unspecified: Secondary | ICD-10-CM

## 2020-07-10 DIAGNOSIS — I10 Essential (primary) hypertension: Secondary | ICD-10-CM | POA: Diagnosis not present

## 2020-07-10 DIAGNOSIS — M25512 Pain in left shoulder: Secondary | ICD-10-CM

## 2020-07-10 DIAGNOSIS — G8929 Other chronic pain: Secondary | ICD-10-CM

## 2020-07-10 NOTE — Assessment & Plan Note (Signed)
Hyperlipidemia:Low fat diet discussed and encouraged.   Lipid Panel  Lab Results  Component Value Date   CHOL 226 (H) 06/15/2020   HDL 53 06/15/2020   LDLCALC 146 (H) 06/15/2020   TRIG 148 06/15/2020   CHOLHDL 4.3 06/15/2020     Updated lab needed at/ before next visit.;

## 2020-07-10 NOTE — Assessment & Plan Note (Signed)
persisitent and uncopntrolled with certain m,ovements, refer Ortho

## 2020-07-10 NOTE — Patient Instructions (Addendum)
Follow-up in office with MD in 6 months call if you need me sooner.  You are referred to Dr. Luna Glasgow re left shoulder pain.  Please follow low-fat diet egg yolks and red meat shellfish and butter and processed foods.  Increase vegetable and fruit and been intake.  For bones and heartburn take calcium 1000 mg every day.  For bones start over-the-counter vitamin D3 1000 international units daily.  Ensure you have regular eye exams, vision screen shows reduced vision, nurse please refer if  No eye appt this year to the Prov[facility] that she sees  Fasting lipid chem 7 and EGFR in 5.5 months   Fat and Cholesterol Restricted Eating Plan Getting too much fat and cholesterol in your diet may cause health problems. Choosing the right foods helps keep your fat and cholesterol at normal levels. This can keep you from getting certain diseases. Your doctor may recommend an eating plan that includes:  Total fat: ______% or less of total calories a day.  Saturated fat: ______% or less of total calories a day.  Cholesterol: less than _________mg a day.  Fiber: ______g a day. What are tips for following this plan? Meal planning  At meals, divide your plate into four equal parts: ? Fill one-half of your plate with vegetables and green salads. ? Fill one-fourth of your plate with whole grains. ? Fill one-fourth of your plate with low-fat (lean) protein foods.  Eat fish that is high in omega-3 fats at least two times a week. This includes mackerel, tuna, sardines, and salmon.  Eat foods that are high in fiber, such as whole grains, beans, apples, broccoli, carrots, peas, and barley. General tips   Work with your doctor to lose weight if you need to.  Avoid: ? Foods with added sugar. ? Fried foods. ? Foods with partially hydrogenated oils.  Limit alcohol intake to no more than 1 drink a day for nonpregnant women and 2 drinks a day for men. One drink equals 12 oz of beer, 5 oz of wine, or 1 oz  of hard liquor. Reading food labels  Check food labels for: ? Trans fats. ? Partially hydrogenated oils. ? Saturated fat (g) in each serving. ? Cholesterol (mg) in each serving. ? Fiber (g) in each serving.  Choose foods with healthy fats, such as: ? Monounsaturated fats. ? Polyunsaturated fats. ? Omega-3 fats.  Choose grain products that have whole grains. Look for the word "whole" as the first word in the ingredient list. Cooking  Cook foods using low-fat methods. These include baking, boiling, grilling, and broiling.  Eat more home-cooked foods. Eat at restaurants and buffets less often.  Avoid cooking using saturated fats, such as butter, cream, palm oil, palm kernel oil, and coconut oil. Recommended foods  Fruits  All fresh, canned (in natural juice), or frozen fruits. Vegetables  Fresh or frozen vegetables (raw, steamed, roasted, or grilled). Green salads. Grains  Whole grains, such as whole wheat or whole grain breads, crackers, cereals, and pasta. Unsweetened oatmeal, bulgur, barley, quinoa, or brown rice. Corn or whole wheat flour tortillas. Meats and other protein foods  Ground beef (85% or leaner), grass-fed beef, or beef trimmed of fat. Skinless chicken or [place]. Ground chicken or [place]. Pork trimmed of fat. All fish and seafood. Egg whites. Dried beans, peas, or lentils. Unsalted nuts or seeds. Unsalted canned beans. Nut butters without added sugar or oil. Dairy  Low-fat or nonfat dairy products, such as skim or 1% milk, 2% or reduced-fat  cheeses, low-fat and fat-free ricotta or cottage cheese, or plain low-fat and nonfat yogurt. Fats and oils  Tub margarine without trans fats. Light or reduced-fat mayonnaise and salad dressings. Avocado. Olive, canola, sesame, or safflower oils. The items listed above may not be a complete list of foods and beverages you can eat. Contact a dietitian for more information. Foods to avoid Fruits  Canned fruit in heavy  syrup. Fruit in cream or butter sauce. Fried fruit. Vegetables  Vegetables cooked in cheese, cream, or butter sauce. Fried vegetables. Grains  White bread. White pasta. White rice. Cornbread. Bagels, pastries, and croissants. Crackers and snack foods that contain trans fat and hydrogenated oils. Meats and other protein foods  Fatty cuts of meat. Ribs, chicken wings, bacon, sausage, bologna, salami, chitterlings, fatback, hot dogs, bratwurst, and packaged lunch meats. Liver and organ meats. Whole eggs and egg yolks. Chicken and [place] with skin. Fried meat. Dairy  Whole or 2% milk, cream, half-and-half, and cream cheese. Whole milk cheeses. Whole-fat or sweetened yogurt. Full-fat cheeses. Nondairy creamers and whipped toppings. Processed cheese, cheese spreads, and cheese curds. Beverages  Alcohol. Sugar-sweetened drinks such as sodas, lemonade, and fruit drinks. Fats and oils  Butter, stick margarine, lard, shortening, ghee, or bacon fat. Coconut, palm kernel, and palm oils. Sweets and desserts  Corn syrup, sugars, honey, and molasses. Candy. Jam and jelly. Syrup. Sweetened cereals. Cookies, pies, cakes, donuts, muffins, and ice cream. The items listed above may not be a complete list of foods and beverages you should avoid. Contact a dietitian for more information. Summary  Choosing the right foods helps keep your fat and cholesterol at normal levels. This can keep you from getting certain diseases.  At meals, fill one-half of your plate with vegetables and green salads.  Eat high-fiber foods, like whole grains, beans, apples, carrots, peas, and barley.  Limit added sugar, saturated fats, alcohol, and fried foods. This information is not intended to replace advice given to you by your health care provider. Make sure you discuss any questions you have with your health care provider. Document Revised: 04/15/2018 Document Reviewed: 04/29/2017 Elsevier Patient Education  [place].

## 2020-07-10 NOTE — Assessment & Plan Note (Signed)

## 2020-07-11 ENCOUNTER — Encounter: Payer: Self-pay | Admitting: Family Medicine

## 2020-07-11 NOTE — Progress Notes (Signed)
    Yvonne Brewer     MRN: [id number]      DOB: 1945-12-19 HPI: Patient is in for annual physical exam. C/o left shoulder pain with certain movements, no improvement with intra articular injection , x ray was normal, will refer back to Ortho Recent labs, if are reviewed. Immunization is reviewed , and  updated.   PE: BP 136/81   Pulse 79   Resp 16   Ht 5\' 2"  (1.575 m)   Wt 186 lb (84.4 kg)   SpO2 96%   BMI 34.02 kg/m   Pleasant  female, alert and oriented x 3, in no cardio-pulmonary distress. Afebrile. HEENT No facial trauma or asymetry. Sinuses non tender.  Extra occullar muscles intact.. External ears normal, . Neck: supple, no adenopathy,JVD or thyromegaly.No bruits.  Chest: Clear to ascultation bilaterally.No crackles or wheezes. Non tender to palpation  Breast: No asymetry,no masses or lumps. No tenderness. No nipple discharge or inversion. No axillary or supraclavicular adenopathy  Cardiovascular system; Heart sounds normal,  S1 and  S2 ,no S3.  No murmur, or thrill. Apical beat not displaced Peripheral pulses normal.  Abdomen: Soft, non tender, no organomegaly or masses. No bruits. Bowel sounds normal. No guarding, tenderness or rebound.     Musculoskeletal exam: [place]  ROM of spine, hips , right shoulder and knees.Markedly reduced ROM left shoulder  No deformity ,swelling or crepitus noted. No muscle wasting or atrophy.   Neurologic: Cranial nerves 2 to 12 intact. Power, tone ,sensation and reflexes normal throughout. No disturbance in gait. No tremor.  Skin: Intact, no ulceration, erythema , scaling or rash noted. Pigmentation normal throughout  Psych; Normal mood and affect. Judgement and concentration normal   Assessment & Plan:  Annual physical exam Annual exam as documented. Counseling done  re healthy lifestyle involving commitment to 150 minutes exercise per week, heart healthy diet, and attaining healthy weight.The importance  of adequate sleep also discussed. Regular seat belt use and home safety, is also discussed. Changes in health habits are decided on by the patient with goals and time frames  set for achieving them. Immunization and cancer screening needs are specifically addressed at this visit.   Hyperlipidemia LDL goal <100 Hyperlipidemia:Low fat diet discussed and encouraged.   Lipid Panel  Lab Results  Component Value Date   CHOL 226 (H) 06/15/2020   HDL 53 06/15/2020   LDLCALC 146 (H) 06/15/2020   TRIG 148 06/15/2020   CHOLHDL 4.3 06/15/2020     Updated lab needed at/ before next visit.;  Left shoulder pain persisitent and uncopntrolled with certain m,ovements, refer Ortho

## 2020-07-12 ENCOUNTER — Encounter: Payer: Self-pay | Admitting: Orthopaedic Surgery

## 2020-07-12 ENCOUNTER — Other Ambulatory Visit: Payer: Self-pay

## 2020-07-12 ENCOUNTER — Ambulatory Visit: Payer: PPO

## 2020-07-12 ENCOUNTER — Ambulatory Visit (INDEPENDENT_AMBULATORY_CARE_PROVIDER_SITE_OTHER): Payer: PPO | Admitting: Orthopaedic Surgery

## 2020-07-12 VITALS — BP 147/90 | HR 93 | Ht 62.0 in | Wt 185.0 lb

## 2020-07-12 DIAGNOSIS — G8929 Other chronic pain: Secondary | ICD-10-CM

## 2020-07-12 DIAGNOSIS — M79671 Pain in right foot: Secondary | ICD-10-CM | POA: Diagnosis not present

## 2020-07-12 DIAGNOSIS — M25512 Pain in left shoulder: Secondary | ICD-10-CM | POA: Diagnosis not present

## 2020-07-12 NOTE — Patient Instructions (Addendum)
Check your shoes for arch supports, you can get those over the counter, make sure they give you the support you need.   Use Aspercreme, Biofreeze or Voltaren gel over the counter 2-3 times daily make sure you rub it in well each time you use it.

## 2020-07-12 NOTE — Progress Notes (Signed)
Patient TG:GYIRSWN SATONYA Brewer, female DOB:February 13, 1946, 74 y.o. IOE:[id number]  Chief Complaint  Patient presents with  . Shoulder Pain    Lt shoulder   . Foot Pain    Rt foot    HPI  Yvonne Brewer is a 74 y.o. female who has continued pain of the left shoulder.  It gets bad then she has minimal pain. She has pain of the left upper arm also but no radiation, no swelling.  I will get MRI as this has been present so long and not improving.  She has pain of the right foot arch area with some swelling.  She has no trauma, no redness.   Body mass index is 33.84 kg/m.  ROS  Review of Systems  Musculoskeletal: Positive for arthralgias, gait problem and joint swelling.  All other systems reviewed and are negative.   All other systems reviewed and are negative.  The following is a summary of the past history medically, past history surgically, known current medicines, social history and family history.  This information is gathered electronically by the computer from prior information and documentation.  I review this each visit and have found including this information at this point in the chart is beneficial and informative.    Past Medical History:  Diagnosis Date  . Essential hypertension 2015  . Female bladder prolapse   . History of cystitis   . History of skin cancer   . Hyperlipidemia   . Obesity     Past Surgical History:  Procedure Laterality Date  . CATARACT EXTRACTION  2012   bilateral   . COLONOSCOPY  11/06/2011   Procedure: COLONOSCOPY;  Surgeon: Rogene Houston, MD;  [facility];  Service: Endoscopy;  Laterality: N/A;  7:30  . COLONOSCOPY N/A 05/21/2017   Procedure: COLONOSCOPY;  Surgeon: Rogene Houston, MD;  [facility];  Service: Endoscopy;  Laterality: N/A;  730-moved to 9/26 @10 :30am per Lelon Frohlich  . EYE SURGERY  2011   bilateral cataract with implants  . HAMMER TOE SURGERY Left 01/2016  . Left bunionectomy    . Squamous cell ca rt ant  chest 1989    . YAG LASER APPLICATION Left 1/82/9937   Procedure: YAG LASER APPLICATION;  Surgeon: Rutherford Guys, MD;  Location: [facility];  Service: Ophthalmology;  Laterality: Left;  . YAG LASER APPLICATION Right 1/69/6789   Procedure: YAG LASER APPLICATION;  Surgeon: Rutherford Guys, MD;  Location: [facility];  Service: Ophthalmology;  Laterality: Right;    Family History  Problem Relation Age of Onset  . Stroke Mother   . Hypertension Mother   . Heart disease Father   . Cancer Maternal Grandmother   . Anesthesia problems Neg Hx   . Hypotension Neg Hx   . Malignant hyperthermia Neg Hx   . Pseudochol deficiency Neg Hx     Social History Social History   Tobacco Use  . Smoking status: Never Smoker  . Smokeless tobacco: Never Used  Vaping Use  . Vaping Use: Never used  Substance Use Topics  . Alcohol use: Yes    Comment: glass of red wine most nights or scotch and water  . Drug use: No    Allergies  Allergen Reactions  . Ace Inhibitors Cough    Cough  . Penicillins Hives    Has patient had a PCN reaction causing immediate rash, facial/tongue/throat swelling, SOB or lightheadedness with hypotension: No Has patient had a PCN reaction causing severe rash involving mucus membranes  or skin necrosis: No Has patient had a PCN reaction that required hospitalization No Has patient had a PCN reaction occurring within the last 10 years: No If all of the above answers are "NO", then may proceed with Cephalosporin use.   . Sulfonamide Derivatives Hives    Current Outpatient Medications  Medication Sig Dispense Refill  . amLODipine (NORVASC) 5 MG tablet TAKE 1 TABLET BY MOUTH EVERY DAY 90 tablet 1  . calcium elemental as carbonate (BARIATRIC TUMS ULTRA) 400 MG chewable tablet Chew 1,000 mg by mouth 3 (three) times daily.    . clotrimazole-betamethasone (LOTRISONE) cream APPLY TWO TIMES DAILY TO DRY SCALY , ITCHY RASHES FOR 1 WEEK, THEN , AS NEEDED 45 g 0  . omeprazole (PRILOSEC) 20 MG  capsule Take 1 capsule (20 mg total) by mouth daily. 90 capsule 1   No current facility-administered medications for this visit.     Physical Exam  Blood pressure (!) 147/90, pulse 93, height 5\' 2"  (1.575 m), weight 185 lb (83.9 kg).  Constitutional: overall normal hygiene, normal nutrition, well developed, normal grooming, normal body habitus. Assistive device:none  Musculoskeletal: gait and station Limp none, muscle tone and strength are normal, no tremors or atrophy is present.  .  Neurological: coordination overall normal.  Deep tendon reflex/nerve stretch intact.  Sensation normal.  Cranial nerves II-XII intact.   Skin:   Normal overall no scars, lesions, ulcers or rashes. No psoriasis.  Psychiatric: Alert and oriented x 3.  Recent memory intact, remote memory unclear.  Normal mood and affect. Well groomed.  Good eye contact.  Cardiovascular: overall no swelling, no varicosities, no edema bilaterally, normal temperatures of the legs and arms, no clubbing, cyanosis and good capillary refill.  Lymphatic: palpation is normal.  Left shoulder has full motion but pain in the extremes.  NV intact.  The right foot has some tenderness of the arch area mid foot but no redness.  NV intact.  Gait is good.  All other systems reviewed and are negative   The patient has been educated about the nature of the problem(s) and counseled on treatment options.  The patient appeared to understand what I have discussed and is in agreement with it.  Encounter Diagnoses  Name Primary?  . Pain in right foot Yes  . Chronic left shoulder pain   x-rays were done of the right foot, reported separately.   PLAN Call if any problems.  Precautions discussed.  Continue current medications.   Return to clinic 3 weeks   Get MRI of the left shoulder.  Ibuprofen and rubs for the foot.  Insert in shoe may help also.  Electronically Signed Sanjuana Kava, MD 11/17/20213:51 PM

## 2020-07-17 ENCOUNTER — Other Ambulatory Visit: Payer: Self-pay | Admitting: Orthopaedic Surgery

## 2020-07-17 DIAGNOSIS — G8929 Other chronic pain: Secondary | ICD-10-CM

## 2020-07-17 NOTE — Progress Notes (Signed)
I received a call from [facility] PT that for the shoulder it would be Occupational therapy. Orders changed.

## 2020-07-27 ENCOUNTER — Telehealth: Payer: Self-pay | Admitting: Orthopaedic Surgery

## 2020-07-27 ENCOUNTER — Ambulatory Visit: Payer: PPO | Admitting: Orthopaedic Surgery

## 2020-07-27 DIAGNOSIS — M79671 Pain in right foot: Secondary | ICD-10-CM

## 2020-07-27 NOTE — Telephone Encounter (Signed)
Please arrange

## 2020-07-27 NOTE — Telephone Encounter (Signed)
Please advise 

## 2020-07-27 NOTE — Telephone Encounter (Signed)
Patient called to inquire as to whether Dr Luna Glasgow would consider ordering physical therapy for her right foot problem which he had seen her for at time of visit 07/12/20. If so, she would like to have it done at [facility].

## 2020-07-31 ENCOUNTER — Other Ambulatory Visit: Payer: Self-pay

## 2020-07-31 ENCOUNTER — Ambulatory Visit (HOSPITAL_COMMUNITY)
Admission: RE | Admit: 2020-07-31 | Discharge: 2020-07-31 | Disposition: A | Discharge status: Home / Self Care | Payer: PPO | Source: Ambulatory Visit | Attending: Family Medicine | Admitting: Family Medicine

## 2020-07-31 DIAGNOSIS — Z1231 Encounter for screening mammogram for malignant neoplasm of breast: Secondary | ICD-10-CM | POA: Insufficient documentation

## 2020-08-01 ENCOUNTER — Ambulatory Visit (HOSPITAL_COMMUNITY): Payer: PPO | Attending: Orthopaedic Surgery | Admitting: Occupational Therapy

## 2020-08-01 DIAGNOSIS — M79671 Pain in right foot: Secondary | ICD-10-CM | POA: Insufficient documentation

## 2020-08-01 DIAGNOSIS — R2689 Other abnormalities of gait and mobility: Secondary | ICD-10-CM | POA: Insufficient documentation

## 2020-08-01 DIAGNOSIS — M25512 Pain in left shoulder: Secondary | ICD-10-CM

## 2020-08-01 DIAGNOSIS — R29898 Other symptoms and signs involving the musculoskeletal system: Secondary | ICD-10-CM | POA: Diagnosis not present

## 2020-08-01 DIAGNOSIS — M6281 Muscle weakness (generalized): Secondary | ICD-10-CM | POA: Insufficient documentation

## 2020-08-01 NOTE — Patient Instructions (Signed)
  1) Flexion Wall Stretch ° ° ° °Face wall, place affected handon wall in front of you. Slide hand up the wall  °and lean body in towards the wall. Hold for 10 seconds. Repeat 3-5 times. 1-2 times/day.  ° ° ° °2) Towel Stretch with Internal Rotation °  Or    ° °Gently pull up (or to the side) your affected arm  behind your back with the assist of a towel. Hold 10 seconds, repeat 3-5 times. 1-2 times/day.  ° ° ° ° ° ° ° ° ° ° ° °3) Corner Stretch  ° ° °Stand at a corner of a wall, place your arms on the walls with elbows bent. Lean into the corner until a stretch is felt along the front of your chest and/or shoulders. Hold for 10 seconds. Repeat 3-5X, 1-2 times/day.  ° ° °4) Posterior Capsule Stretch ° ° ° °Bring the involved arm across chest. Grasp elbow and pull toward chest until you feel a stretch in the back of the upper arm and shoulder. Hold 10 seconds. Repeat 3-5X. Complete 1-2 times/day.  ° ° °5) Scapular Retraction ° ° ° °Tuck chin back as you pinch shoulder blades together.  Hold 5 seconds. Repeat 3-5X. Complete 1-2 times/day.  ° ° ° ° ° ° °

## 2020-08-01 NOTE — Therapy (Signed)
[place], [place], [address] Phone: [phone]   Fax:  [phone]  Occupational Therapy Evaluation  Patient Details  Name: Yvonne Brewer MRN: [id number] Date of Birth: [birth year] Referring Provider (OT): Dr. Sanjuana Kava   Encounter Date: 08/01/2020   OT End of Session - 08/01/20 1417    Visit Number 1    Number of Visits 5    Date for OT Re-Evaluation 08/31/20    Authorization Type 1) [employer] $15 copay 2) [employer]BS    Authorization Time Period No visit limit    OT Start Time 1345    OT Stop Time 1415    OT Time Calculation (min) 30 min    Activity Tolerance Patient tolerated treatment well    Behavior During Therapy [facility] for tasks assessed/performed           Past Medical History:  Diagnosis Date  . Essential hypertension 2015  . Female bladder prolapse   . History of cystitis   . History of skin cancer   . Hyperlipidemia   . Obesity     Past Surgical History:  Procedure Laterality Date  . CATARACT EXTRACTION  2012   bilateral   . COLONOSCOPY  11/06/2011   Procedure: COLONOSCOPY;  Surgeon: Rogene Houston, MD;  [facility];  Service: Endoscopy;  Laterality: N/A;  7:30  . COLONOSCOPY N/A 05/21/2017   Procedure: COLONOSCOPY;  Surgeon: Rogene Houston, MD;  [facility];  Service: Endoscopy;  Laterality: N/A;  730-moved to 9/26 @10 :30am per Lelon Frohlich  . EYE SURGERY  2011   bilateral cataract with implants  . HAMMER TOE SURGERY Left 01/2016  . Left bunionectomy    . Squamous cell ca rt ant chest 1989    . YAG LASER APPLICATION Left 0/92/3300   Procedure: YAG LASER APPLICATION;  Surgeon: Rutherford Guys, MD;  Location: [facility];  Service: Ophthalmology;  Laterality: Left;  . YAG LASER APPLICATION Right 7/62/2633   Procedure: YAG LASER APPLICATION;  Surgeon: Rutherford Guys, MD;  Location: [facility];  Service: Ophthalmology;  Laterality: Right;    There were no vitals filed for  this visit.   Subjective Assessment - 08/01/20 1406    Subjective  S: I'm not sure what I did to hurt my arm.    Pertinent History Pt is a 74 y/o female presenting with left shoulder pain present for approximately 1 month. Pt with negative x-ray, insurance would not pay for MRI until after therapy has been completed. Pt was referred to occupational therapy for evaluation and treatment by Dr. Sanjuana Kava.    Special Tests FOTO: 58/100    Patient Stated Goals To have less pain in my arm.    Currently in Pain? No/denies             [facility] OT Assessment - 08/01/20 1342      Assessment   Medical Diagnosis left shoulder pain    Referring Provider (OT) Dr. Sanjuana Kava    Onset Date/Surgical Date 07/02/20    Hand Dominance Right    Next MD Visit None scheduled     Prior Therapy None      Precautions   Precautions None      Restrictions   Weight Bearing Restrictions No      Balance Screen   Has the patient fallen in the past 6 months No    Has the patient had a decrease in activity level because of  a fear of falling?  No    Is the patient reluctant to leave their home because of a fear of falling?  No      Prior Function   Level of Independence Independent    Vocation Retired    Leisure traveling, [employer]      ADL   ADL comments Pt is having difficulty with reaching behind her back, donning shirts, reaching to the backseat of a car, doing yardwork. Pt reports difficulty sleeping on the left side.       Cognition   Overall Cognitive Status Within Functional Limits for tasks assessed      Observation/Other Assessments   Focus on Therapeutic Outcomes (FOTO)  58/100      ROM / Strength   AROM / PROM / Strength AROM;Strength      Palpation   Palpation comment mod fascial restrictions along upper arm and trapeizus regions      AROM   Overall AROM Comments Assessed seated, er/IR adducted    AROM Assessment Site Shoulder    Right/Left Shoulder Left    Left Shoulder  Flexion 140 Degrees    Left Shoulder ABduction 139 Degrees    Left Shoulder Internal Rotation 90 Degrees    Left Shoulder External Rotation 77 Degrees      Strength   Overall Strength Comments Assessed seated, er/IR adducted    Strength Assessment Site Shoulder    Right/Left Shoulder Left    Left Shoulder Flexion 4+/5    Left Shoulder ABduction 4/5    Left Shoulder Internal Rotation 5/5    Left Shoulder External Rotation 4+/5                           OT Education - 08/01/20 1406    Education Details shoulder stretches    Person(s) Educated Patient    Methods Explanation;Demonstration;Handout    Comprehension Verbalized understanding;Returned demonstration            OT Short Term Goals - 08/01/20 1420      OT SHORT TERM GOAL #1   Title Pt will be provided with and educated on HEP to improve LUE functional use as non-dominant during ADLs.    Time 4    Period Weeks    Status New    Target Date 08/31/20      OT SHORT TERM GOAL #2   Title Pt will decrease pain in LUE to 3/10 or less to improve ability to sleep on her left side for 4+ hours without waking due to pain.    Time 4    Period Weeks    Status New      OT SHORT TERM GOAL #3   Title Pt will decrease LUE fascial restrictions to improve ability to perform functional reaching during dressing.    Time 4    Period Weeks    Status New      OT SHORT TERM GOAL #4   Title Pt will improve LUE shoulder stability required for participation in water aerobics.    Time 4    Period Weeks    Status New                    Plan - 08/01/20 1418    Clinical Impression Statement A: Pt is a 74 y/o female presenting with left shoulder pain limiting functional use of LUE during ADLs and leisure tasks. Pt with equivalent BUE ROM and strength,  however reports LUE pain with functional reaching and ADL tasks.    OT Occupational Profile and History Problem Focused Assessment - Including review of records  relating to presenting problem    Occupational performance deficits (Please refer to evaluation for details): ADL's;IADL's;Rest and Sleep;Leisure    Body Structure / Function / Physical Skills ADL;Endurance;UE functional use;Fascial restriction;Pain;ROM;IADL;Strength    Rehab Potential Good    Clinical Decision Making Limited treatment options, no task modification necessary    Comorbidities Affecting Occupational Performance: None    Modification or Assistance to Complete Evaluation  No modification of tasks or assist necessary to complete eval    OT Frequency 1x / week    OT Duration 4 weeks    OT Treatment/Interventions Self-care/ADL training;Ultrasound;DME and/or AE instruction;Patient/family education;Passive range of motion;Cryotherapy;Electrical Stimulation;Splinting;Moist Heat;Therapeutic exercise;Therapeutic activities;Manual Therapy    Plan P: Pt will benefit from skilled OT services to decrease pain and fascial restrictions, increase functional use of LUE during ADL completion. Treatment plan: myofascial release and manual techniques, passive stretching, A/ROM, shoulder and scapular stability and strengthening, modalities prn    OT Home Exercise Plan 12/7: shoulder stretches    Consulted and Agree with Plan of Care Patient           Patient will benefit from skilled therapeutic intervention in order to improve the following deficits and impairments:   Body Structure / Function / Physical Skills: ADL, Endurance, UE functional use, Fascial restriction, Pain, ROM, IADL, Strength       Visit Diagnosis: Acute pain of left shoulder  Other symptoms and signs involving the musculoskeletal system    Problem List Patient Active Problem List   Diagnosis Date Noted  . Left shoulder pain 07/10/2020  . Anxiety 07/10/2019  . Heartburn 07/07/2019  . Educated about COVID-19 virus infection 01/10/2019  . Fracture, foot, left, sequela 02/09/2018  . Dyspepsia 04/18/2017  . Chest pain  04/18/2017  . Osteoarthritis of both hips 04/18/2017  . History of colonic polyps 11/13/2016  . Pseudophakia 11/12/2016  . Urinary incontinence in female 07/29/2016  . Environmental allergies 07/14/2016  . Annual physical exam 04/02/2015  . Chronic radicular pain of lower back 03/13/2014  . Reduced vision 03/13/2014  . Essential hypertension 03/08/2014  . Obesity (BMI 30.0-34.9) 02/19/2010  . Vitamin D deficiency 07/27/2009  . Hyperlipidemia LDL goal <100 02/19/2008  . Disorder of bladder 02/19/2008   Guadelupe Sabin, OTR/L  [phone] 08/01/2020, 2:23 PM  [place], [place], [address] Phone: [phone]   Fax:  [phone]  Name: Yvonne Brewer MRN: [id number] Date of Birth: 07-Jan-1946

## 2020-08-03 ENCOUNTER — Ambulatory Visit: Payer: PPO | Admitting: Orthopaedic Surgery

## 2020-08-07 ENCOUNTER — Encounter (HOSPITAL_COMMUNITY): Payer: Self-pay | Admitting: Physical Therapy

## 2020-08-07 ENCOUNTER — Ambulatory Visit (HOSPITAL_COMMUNITY): Payer: PPO | Admitting: Physical Therapy

## 2020-08-07 ENCOUNTER — Other Ambulatory Visit: Payer: Self-pay

## 2020-08-07 DIAGNOSIS — M25512 Pain in left shoulder: Secondary | ICD-10-CM | POA: Diagnosis not present

## 2020-08-07 DIAGNOSIS — M79671 Pain in right foot: Secondary | ICD-10-CM

## 2020-08-07 DIAGNOSIS — M6281 Muscle weakness (generalized): Secondary | ICD-10-CM

## 2020-08-07 DIAGNOSIS — R2689 Other abnormalities of gait and mobility: Secondary | ICD-10-CM

## 2020-08-07 DIAGNOSIS — R29898 Other symptoms and signs involving the musculoskeletal system: Secondary | ICD-10-CM

## 2020-08-07 NOTE — Patient Instructions (Signed)
Access Code: [email or web address] URL: [email or web address] Date: 08/07/2020 Prepared by: [facility] Medical illustrator on Wall - 2 x daily - 7 x weekly - 3 reps - 30 second hold Seated Ankle Inversion Eversion PROM - 2 x daily - 7 x weekly - 10 reps - 5 second hold

## 2020-08-07 NOTE — Therapy (Signed)
[place], [place], [address] Phone: [phone]   Fax:  [phone]  Physical Therapy Treatment  Patient Details  Name: Yvonne Brewer MRN: [id number] Date of Birth: July 02, 1946 Referring Provider (PT): Sanjuana Kava MD   Encounter Date: 08/07/2020   PT End of Session - 08/07/20 1357    Visit Number 1    Number of Visits 8    Date for PT Re-Evaluation 09/04/20    Authorization Type [employer] Secondary: [employer] (no visit limit, No auth)    PT Start Time 1315    PT Stop Time 1355    PT Time Calculation (min) 40 min    Activity Tolerance Patient tolerated treatment well    Behavior During Therapy [facility] for tasks assessed/performed           Past Medical History:  Diagnosis Date  . Essential hypertension 2015  . Female bladder prolapse   . History of cystitis   . History of skin cancer   . Hyperlipidemia   . Obesity     Past Surgical History:  Procedure Laterality Date  . CATARACT EXTRACTION  2012   bilateral   . COLONOSCOPY  11/06/2011   Procedure: COLONOSCOPY;  Surgeon: Rogene Houston, MD;  [facility];  Service: Endoscopy;  Laterality: N/A;  7:30  . COLONOSCOPY N/A 05/21/2017   Procedure: COLONOSCOPY;  Surgeon: Rogene Houston, MD;  [facility];  Service: Endoscopy;  Laterality: N/A;  730-moved to 9/26 @10 :30am per Lelon Frohlich  . EYE SURGERY  2011   bilateral cataract with implants  . HAMMER TOE SURGERY Left 01/2016  . Left bunionectomy    . Squamous cell ca rt ant chest 1989    . YAG LASER APPLICATION Left 08/27/5850   Procedure: YAG LASER APPLICATION;  Surgeon: Rutherford Guys, MD;  Location: [facility];  Service: Ophthalmology;  Laterality: Left;  . YAG LASER APPLICATION Right 7/78/2423   Procedure: YAG LASER APPLICATION;  Surgeon: Rutherford Guys, MD;  Location: [facility];  Service: Ophthalmology;  Laterality: Right;    There were no vitals filed for this visit.    Subjective Assessment - 08/07/20 1322    Subjective Patient is a 74 y.o. female who presents to physical therapy with c/o R foot pain. Patient states she is having pain in the arch of her foot. She went to [facility] about a month ago and they took an xray which didn't show anything. Pain is worse with walking. She can walk for about 10 minutes. Symptoms improve with rest. She has tried ice which feels good. She tried different footwear without relief. Symptoms have come and gone sometimes. She feels herself limping at night when she gets up to go the bathroom. Symptoms began in October with insidious onset. Her main goal is to have her feet to be normal and not limp.    Limitations Walking;House hold activities;Standing    How long can you walk comfortably? 10 minutes    Patient Stated Goals Not limp    Currently in Pain? Yes    Pain Score 1     Pain Location Foot    Pain Orientation Right    Pain Descriptors / Indicators Tender    Pain Type Acute pain    Pain Onset More than a month ago    Pain Frequency Intermittent              [facility] PT Assessment - 08/07/20 0001  Assessment   Medical Diagnosis R foot pain    Referring Provider (PT) Sanjuana Kava MD    Onset Date/Surgical Date 05/29/20    Next MD Visit None scheduled     Prior Therapy None      Precautions   Precautions None      Restrictions   Weight Bearing Restrictions No      Balance Screen   Has the patient fallen in the past 6 months No    Has the patient had a decrease in activity level because of a fear of falling?  No    Is the patient reluctant to leave their home because of a fear of falling?  No      Prior Function   Level of Independence Independent    Vocation Retired    Leisure traveling, water aerobics      Observation/Other Assessments   Observations Ambulates without AD, pes planus bilateral with R>L, R bunion    Focus on Therapeutic Outcomes (FOTO)  complete next session      AROM   AROM Assessment  Site Ankle    Right/Left Ankle Right;Left    Right Ankle Dorsiflexion 5   lacking   Right Ankle Plantar Flexion 55    Right Ankle Inversion 33    Right Ankle Eversion 15    Left Ankle Dorsiflexion 4    Left Ankle Plantar Flexion 55    Left Ankle Inversion 32    Left Ankle Eversion 45      Strength   Strength Assessment Site Knee;Ankle    Right/Left Knee Right;Left    Right Knee Flexion 4-/5    Right Knee Extension 5/5    Left Knee Flexion 5/5    Left Knee Extension 5/5    Right/Left Ankle Right;Left    Right Ankle Dorsiflexion 5/5    Right Ankle Inversion 5/5    Right Ankle Eversion 5/5    Left Ankle Dorsiflexion 5/5    Left Ankle Inversion 5/5    Left Ankle Eversion 5/5      Palpation   Palpation comment TTP navicular tuberosity, posterior tib tendon and muscle belly R leg; decreased medial arch in standing bilaterally with R>L      Ambulation/Gait   Ambulation/Gait Yes    Ambulation/Gait Assistance 7: Independent    Ambulation Distance (Feet) 525 Feet    Assistive device None    Gait Pattern --   decreased DF R ankle   Ambulation Surface Level;Indoor    Gait velocity decreased    Gait Comments 2 MWT, decreased R ankle DF, early heel off, increase in limping due to posterior lateral knee pain                         [facility] Adult PT Treatment/Exercise - 08/07/20 0001      Exercises   Exercises Ankle      Ankle Exercises: Stretches   Gastroc Stretch 3 reps;30 seconds    Gastroc Stretch Limitations at wall      Ankle Exercises: Seated   Other Seated Ankle Exercises ankle inversion iso 10x 5 second holds                  PT Education - 08/07/20 1314    Education Details Patient educated on exam findings, POC, scope of PT    Person(s) Educated Patient    Methods Explanation;Demonstration    Comprehension Verbalized understanding;Returned demonstration  PT Short Term Goals - 08/07/20 1434      PT SHORT TERM GOAL #1   Title  Patient will be independent with HEP in order to improve functional outcomes.    Time 2    Period Weeks    Status New    Target Date 08/21/20      PT SHORT TERM GOAL #2   Title Patient will report at least 25% improvement in symptoms for improved quality of life.    Time 2    Period Weeks    Status New    Target Date 08/21/20             PT Long Term Goals - 08/07/20 1434      PT LONG TERM GOAL #1   Title Patient will report at least 75% improvement in symptoms for improved quality of life.    Time 4    Period Weeks    Status New    Target Date 09/04/20      PT LONG TERM GOAL #2   Title Patient will be able to ambulate for at least 20 minutes with pain no greater than 1/10 in order to demonstrate improved ability to ambulate in the community.    Time 4    Period Weeks    Status New    Target Date 09/04/20      PT LONG TERM GOAL #3   Title Patient will improve R ankle DF to 5 degrees in order to improve gait pattern for community ambulation.    Time 4    Period Weeks    Status New    Target Date 09/04/20                 Plan - 08/07/20 1421    Clinical Impression Statement Patient is a 74 y.o. female who presents to physical therapy with c/o R foot pain. She presents with pain limited deficits in R foot strength, ROM, gait, and functional mobility with ADL. She is having to modify and restrict ADL as indicated by subjective information and objective measures which is affecting overall participation. Patient will benefit from skilled physical therapy in order to improve function and reduce impairment.    Personal Factors and Comorbidities Age;Fitness;Time since onset of injury/illness/exacerbation;Past/Current Experience    Examination-Activity Limitations Locomotion Level;Transfers;Stairs;Squat    Examination-Participation Restrictions Cleaning;Meal Prep;Yard Work;Volunteer;Shop;Community Activity    Stability/Clinical Decision Making Stable/Uncomplicated     Clinical Decision Making Low    Rehab Potential Good    PT Frequency 2x / week    PT Duration 4 weeks    PT Treatment/Interventions ADLs/Self Care Home Management;Aquatic Therapy;Cryotherapy;Electrical Stimulation;Iontophoresis 4mg /ml Dexamethasone;Moist Heat;Traction;DME Instruction;Gait training;Stair training;Ultrasound;Functional mobility training;Therapeutic activities;Therapeutic exercise;Balance training;Neuromuscular re-education;Patient/family education;Manual techniques;Compression bandaging;Dry needling;Energy conservation;Splinting;Taping;Spinal Manipulations;Joint Manipulations    PT Next Visit Plan f/u on HEP, continue posterior tib strengthening and perform resistance band, foot doming, balance exercises, foot/ankle strengthening, discuss good arch supports    PT Home Exercise Plan 12/13  inversion iso, calf stretch    Consulted and Agree with Plan of Care Patient           Patient will benefit from skilled therapeutic intervention in order to improve the following deficits and impairments:  Abnormal gait,Difficulty walking,Decreased range of motion,Decreased endurance,Decreased activity tolerance,Decreased balance,Decreased mobility,Decreased strength,Pain,Improper body mechanics,Impaired flexibility  Visit Diagnosis: Pain in right foot  Muscle weakness (generalized)  Other abnormalities of gait and mobility  Other symptoms and signs involving the musculoskeletal system     Problem List  Patient Active Problem List   Diagnosis Date Noted  . Left shoulder pain 07/10/2020  . Anxiety 07/10/2019  . Heartburn 07/07/2019  . Educated about COVID-19 virus infection 01/10/2019  . Fracture, foot, left, sequela 02/09/2018  . Dyspepsia 04/18/2017  . Chest pain 04/18/2017  . Osteoarthritis of both hips 04/18/2017  . History of colonic polyps 11/13/2016  . Pseudophakia 11/12/2016  . Urinary incontinence in female 07/29/2016  . Environmental allergies 07/14/2016  . Annual  physical exam 04/02/2015  . Chronic radicular pain of lower back 03/13/2014  . Reduced vision 03/13/2014  . Essential hypertension 03/08/2014  . Obesity (BMI 30.0-34.9) 02/19/2010  . Vitamin D deficiency 07/27/2009  . Hyperlipidemia LDL goal <100 02/19/2008  . Disorder of bladder 02/19/2008    2:39 PM, 08/07/20 Mearl Latin PT, DPT Physical Therapist at [place], [place], [address] Phone: [phone]   Fax:  [phone]  Name: DIYANA STARRETT MRN: [id number] Date of Birth: 20-Mar-1946

## 2020-08-08 ENCOUNTER — Encounter (HOSPITAL_COMMUNITY): Payer: Self-pay

## 2020-08-08 ENCOUNTER — Ambulatory Visit (HOSPITAL_COMMUNITY): Payer: PPO

## 2020-08-08 DIAGNOSIS — M25512 Pain in left shoulder: Secondary | ICD-10-CM | POA: Diagnosis not present

## 2020-08-08 DIAGNOSIS — R29898 Other symptoms and signs involving the musculoskeletal system: Secondary | ICD-10-CM

## 2020-08-08 NOTE — Therapy (Signed)
[facility] [place], [place], [address] Phone: [phone]   Fax:  [phone]  Occupational Therapy Treatment  Patient Details  Name: Yvonne Brewer MRN: [id number] Date of Birth: 1945-12-03 Referring Provider (OT): Dr. Sanjuana Kava   Encounter Date: 08/08/2020   OT End of Session - 08/08/20 1438    Visit Number 2    Number of Visits 5    Date for OT Re-Evaluation 08/31/20    Authorization Type 1) [employer] $15 copay 2) BCBS    Authorization Time Period No visit limit    OT Start Time 1300    OT Stop Time 1340    OT Time Calculation (min) 40 min    Activity Tolerance Patient tolerated treatment well    Behavior During Therapy [facility] for tasks assessed/performed           Past Medical History:  Diagnosis Date  . Essential hypertension 2015  . Female bladder prolapse   . History of cystitis   . History of skin cancer   . Hyperlipidemia   . Obesity     Past Surgical History:  Procedure Laterality Date  . CATARACT EXTRACTION  2012   bilateral   . COLONOSCOPY  11/06/2011   Procedure: COLONOSCOPY;  Surgeon: Rogene Houston, MD;  [facility];  Service: Endoscopy;  Laterality: N/A;  7:30  . COLONOSCOPY N/A 05/21/2017   Procedure: COLONOSCOPY;  Surgeon: Rogene Houston, MD;  [facility];  Service: Endoscopy;  Laterality: N/A;  730-moved to 9/26 @10 :30am per Lelon Frohlich  . EYE SURGERY  2011   bilateral cataract with implants  . HAMMER TOE SURGERY Left 01/2016  . Left bunionectomy    . Squamous cell ca rt ant chest 1989    . YAG LASER APPLICATION Left 1/32/4401   Procedure: YAG LASER APPLICATION;  Surgeon: Rutherford Guys, MD;  Location: [facility];  Service: Ophthalmology;  Laterality: Left;  . YAG LASER APPLICATION Right 0/27/2536   Procedure: YAG LASER APPLICATION;  Surgeon: Rutherford Guys, MD;  Location: [facility];  Service: Ophthalmology;  Laterality: Right;    There were no vitals filed for  this visit.   Subjective Assessment - 08/08/20 1319    Subjective  S: I do water aerobics every day.We do stretching in the class at the end.    Currently in Pain? Yes    Pain Score 4     Pain Location Shoulder    Pain Orientation Left    Pain Descriptors / Indicators Sore    Pain Type Acute pain    Pain Onset More than a month ago    Pain Frequency Constant    Aggravating Factors  overuse or wrong positioning when sleeping.    Pain Relieving Factors resting it    Effect of Pain on Daily Activities None.    Multiple Pain Sites No              [facility] OT Assessment - 08/08/20 1308      Assessment   Medical Diagnosis left shoulder pain      Precautions   Precautions None                    OT Treatments/Exercises (OP) - 08/08/20 1309      Exercises   Exercises Shoulder      Shoulder Exercises: Supine   Protraction PROM;5 reps    Horizontal ABduction PROM;5 reps    External Rotation PROM;5  reps    Internal Rotation PROM;5 reps    Flexion PROM;5 reps    ABduction PROM;5 reps      Shoulder Exercises: Standing   Protraction AROM;10 reps    Horizontal ABduction AROM;10 reps    External Rotation AROM;10 reps    Internal Rotation AROM;10 reps    Flexion AROM;10 reps    ABduction AROM;10 reps      Shoulder Exercises: Therapy Ball   Other Therapy Ball Exercises green thrapy ball; 12X; chest press, flexion, circles (both directions)      Shoulder Exercises: ROM/Strengthening   UBE (Upper Arm Bike) Level 1 2' reverse 2' forward   pace: 3.5-5.0   Proximal Shoulder Strengthening, Seated 10X A/ROM no rest breaks.      Manual Therapy   Manual Therapy Myofascial release    Manual therapy comments manual therapy completed prior to exercises.    Myofascial Release Myofascial release and manual stretching completed to left upper arm, trapezius, and scapularis region.                  OT Education - 08/08/20 1437    Education Details reviewed therapy  goals. Discussed using her LUE as normally as possible within pain tolerance versus none use. Importance of completing exercises within pain tolerance    Person(s) Educated Patient    Methods Explanation    Comprehension Verbalized understanding            OT Short Term Goals - 08/08/20 1306      OT SHORT TERM GOAL #1   Title Pt will be provided with and educated on HEP to improve LUE functional use as non-dominant during ADLs.    Time 4    Period Weeks    Status On-going    Target Date 08/31/20      OT SHORT TERM GOAL #2   Title Pt will decrease pain in LUE to 3/10 or less to improve ability to sleep on her left side for 4+ hours without waking due to pain.    Time 4    Period Weeks    Status On-going      OT SHORT TERM GOAL #3   Title Pt will decrease LUE fascial restrictions to improve ability to perform functional reaching during dressing.    Time 4    Period Weeks    Status On-going      OT SHORT TERM GOAL #4   Title Pt will improve LUE shoulder stability required for participation in water aerobics.    Time 4    Period Weeks    Status On-going                    Plan - 08/08/20 1438    Clinical Impression Statement A: Initiated myofascial release and manual stretching. Patient able to achieve full passive ROM during manual stretching. Slight pull at end stretch. Complete standing A/ROM and proximal shoulder strengthening was completed with VC for form and technique. Patient with minimal fascial restrictions noted more near the upper trapezius region. Completed manual techniques to address.    Body Structure / Function / Physical Skills ADL;Endurance;UE functional use;Fascial restriction;Pain;ROM;IADL;Strength    Plan P: Complete scapular theraband strengthening if able to tolerate.    Consulted and Agree with Plan of Care Patient           Patient will benefit from skilled therapeutic intervention in order to improve the following deficits and  impairments:   Body Structure /  Function / Physical Skills: ADL,Endurance,UE functional use,Fascial restriction,Pain,ROM,IADL,Strength       Visit Diagnosis: Acute pain of left shoulder  Other symptoms and signs involving the musculoskeletal system    Problem List Patient Active Problem List   Diagnosis Date Noted  . Left shoulder pain 07/10/2020  . Anxiety 07/10/2019  . Heartburn 07/07/2019  . Educated about COVID-19 virus infection 01/10/2019  . Fracture, foot, left, sequela 02/09/2018  . Dyspepsia 04/18/2017  . Chest pain 04/18/2017  . Osteoarthritis of both hips 04/18/2017  . History of colonic polyps 11/13/2016  . Pseudophakia 11/12/2016  . Urinary incontinence in female 07/29/2016  . Environmental allergies 07/14/2016  . Annual physical exam 04/02/2015  . Chronic radicular pain of lower back 03/13/2014  . Reduced vision 03/13/2014  . Essential hypertension 03/08/2014  . Obesity (BMI 30.0-34.9) 02/19/2010  . Vitamin D deficiency 07/27/2009  . Hyperlipidemia LDL goal <100 02/19/2008  . Disorder of bladder 02/19/2008   Ailene Ravel, OTR/L,CBIS  [phone]  08/08/2020, 3:34 PM  [facility] [address] [place], [place], [address] Phone: [phone]   Fax:  [phone]  Name: Yvonne Brewer MRN: [id number] Date of Birth: 23-Aug-1946

## 2020-08-10 ENCOUNTER — Ambulatory Visit (HOSPITAL_COMMUNITY): Payer: PPO

## 2020-08-10 ENCOUNTER — Encounter (HOSPITAL_COMMUNITY): Payer: Self-pay

## 2020-08-10 ENCOUNTER — Other Ambulatory Visit: Payer: Self-pay

## 2020-08-10 DIAGNOSIS — M6281 Muscle weakness (generalized): Secondary | ICD-10-CM

## 2020-08-10 DIAGNOSIS — M25512 Pain in left shoulder: Secondary | ICD-10-CM

## 2020-08-10 DIAGNOSIS — R29898 Other symptoms and signs involving the musculoskeletal system: Secondary | ICD-10-CM

## 2020-08-10 DIAGNOSIS — M79671 Pain in right foot: Secondary | ICD-10-CM

## 2020-08-10 NOTE — Therapy (Signed)
[place], [place], [address] Phone: [phone]   Fax:  [phone]  Physical Therapy Treatment  Patient Details  Name: Yvonne Brewer MRN: [id number] Date of Birth: [birth year] Referring Provider (PT): Sanjuana Kava MD   Encounter Date: 08/10/2020   PT End of Session - 08/10/20 1122    Visit Number 2    Number of Visits 8    Date for PT Re-Evaluation 09/04/20    Authorization Type [employer] Secondary: BCBS (no visit limit, No auth)    PT Start Time 1053    PT Stop Time 1137    PT Time Calculation (min) 44 min    Activity Tolerance Patient tolerated treatment well    Behavior During Therapy [facility] for tasks assessed/performed           Past Medical History:  Diagnosis Date  . Essential hypertension 2015  . Female bladder prolapse   . History of cystitis   . History of skin cancer   . Hyperlipidemia   . Obesity     Past Surgical History:  Procedure Laterality Date  . CATARACT EXTRACTION  2012   bilateral   . COLONOSCOPY  11/06/2011   Procedure: COLONOSCOPY;  Surgeon: Rogene Houston, MD;  [facility];  Service: Endoscopy;  Laterality: N/A;  7:30  . COLONOSCOPY N/A 05/21/2017   Procedure: COLONOSCOPY;  Surgeon: Rogene Houston, MD;  [facility];  Service: Endoscopy;  Laterality: N/A;  730-moved to 9/26 @10 :30am per Lelon Frohlich  . EYE SURGERY  2011   bilateral cataract with implants  . HAMMER TOE SURGERY Left 01/2016  . Left bunionectomy    . Squamous cell ca rt ant chest 1989    . YAG LASER APPLICATION Left 7/78/2423   Procedure: YAG LASER APPLICATION;  Surgeon: Rutherford Guys, MD;  Location: [facility];  Service: Ophthalmology;  Laterality: Left;  . YAG LASER APPLICATION Right 5/36/1443   Procedure: YAG LASER APPLICATION;  Surgeon: Rutherford Guys, MD;  Location[facility];  Service: Ophthalmology;  Laterality: Right;    There were no vitals filed for this visit.    Subjective Assessment - 08/10/20 1056    Subjective Pt reports she is feeling good, no reoprts of pain currenlty.  Reports she wore a pair of good arch support shoes today, would like therapist to look at them.  Stated she was on feet a lot yesterday and had increased pain following.    Patient Stated Goals Not limp    Currently in Pain? No/denies                             [facility] Adult PT Treatment/Exercise - 08/10/20 0001      Exercises   Exercises Ankle      Ankle Exercises: Stretches   Gastroc Stretch 3 reps;30 seconds    Gastroc Stretch Limitations at wall      Ankle Exercises: Seated   Towel Crunch 1 rep    Towel Inversion/Eversion 3 reps    Other Seated Ankle Exercises ankle inversion iso 10x 5 second holds      Ankle Exercises: Supine   Isometrics ankle inversion iso 10x 5 second holds    T-Band GTB all directions 10x 5"                  PT Education - 08/10/20 1106    Education Details Reviewed goals,  educated importance of HEP complaince, pt able to recall current exercise with min cueing for appropriate isometric hold time.  Educated on assistance wiht proper arch support inserts.    Person(s) Educated Patient    Methods Explanation;Demonstration    Comprehension Verbalized understanding;Returned demonstration            PT Short Term Goals - 08/07/20 1434      PT SHORT TERM GOAL #1   Title Patient will be independent with HEP in order to improve functional outcomes.    Time 2    Period Weeks    Status New    Target Date 08/21/20      PT SHORT TERM GOAL #2   Title Patient will report at least 25% improvement in symptoms for improved quality of life.    Time 2    Period Weeks    Status New    Target Date 08/21/20             PT Long Term Goals - 08/07/20 1434      PT LONG TERM GOAL #1   Title Patient will report at least 75% improvement in symptoms for improved quality of life.    Time 4    Period Weeks    Status New     Target Date 09/04/20      PT LONG TERM GOAL #2   Title Patient will be able to ambulate for at least 20 minutes with pain no greater than 1/10 in order to demonstrate improved ability to ambulate in the community.    Time 4    Period Weeks    Status New    Target Date 09/04/20      PT LONG TERM GOAL #3   Title Patient will improve R ankle DF to 5 degrees in order to improve gait pattern for community ambulation.    Time 4    Period Weeks    Status New    Target Date 09/04/20                 Plan - 08/10/20 1304    Clinical Impression Statement Reviewed goals, educated importance of HEP compliance for maximal benefits with PT.  Pt able to recall current HEP, cueing to improve isometric holds and proper hold time with stretch.  Session focus on ankle mobility and strengthening.  Pt educated on importance of intrisic strengthening to improve arch for pain control as well as proper support in shoes.  Advanced HEP with additional towel crunch for intrinsic strengthening and theraband for ankle strengthening.  No reports of pain at EOS.  Was given printout and folder wiht additional HEP to improve follow through.    Personal Factors and Comorbidities Age;Fitness;Time since onset of injury/illness/exacerbation;Past/Current Experience    Examination-Activity Limitations Locomotion Level;Transfers;Stairs;Squat    Examination-Participation Restrictions Cleaning;Meal Prep;Yard Work;Volunteer;Shop;Community Activity    Stability/Clinical Decision Making Stable/Uncomplicated    Clinical Decision Making Low    PT Frequency 2x / week    PT Duration 4 weeks    PT Treatment/Interventions ADLs/Self Care Home Management;Aquatic Therapy;Cryotherapy;Electrical Stimulation;Iontophoresis 4mg /ml Dexamethasone;Moist Heat;Traction;DME Instruction;Gait training;Stair training;Ultrasound;Functional mobility training;Therapeutic activities;Therapeutic exercise;Balance training;Neuromuscular  re-education;Patient/family education;Manual techniques;Compression bandaging;Dry needling;Energy conservation;Splinting;Taping;Spinal Manipulations;Joint Manipulations    PT Next Visit Plan f/u on HEP, continue posterior tib strengthening and perform resistance band, foot doming, balance exercises, foot/ankle strengthening, discuss good arch supports    PT Home Exercise Plan 12/13  inversion iso, calf stretch; 08/10/20: towel crunch, inv/ev wiht towel, 4 way GTB  Patient will benefit from skilled therapeutic intervention in order to improve the following deficits and impairments:  Abnormal gait,Difficulty walking,Decreased range of motion,Decreased endurance,Decreased activity tolerance,Decreased balance,Decreased mobility,Decreased strength,Pain,Improper body mechanics,Impaired flexibility  Visit Diagnosis: Acute pain of left shoulder  Other symptoms and signs involving the musculoskeletal system  Pain in right foot  Muscle weakness (generalized)     Problem List Patient Active Problem List   Diagnosis Date Noted  . Left shoulder pain 07/10/2020  . Anxiety 07/10/2019  . Heartburn 07/07/2019  . Educated about COVID-19 virus infection 01/10/2019  . Fracture, foot, left, sequela 02/09/2018  . Dyspepsia 04/18/2017  . Chest pain 04/18/2017  . Osteoarthritis of both hips 04/18/2017  . History of colonic polyps 11/13/2016  . Pseudophakia 11/12/2016  . Urinary incontinence in female 07/29/2016  . Environmental allergies 07/14/2016  . Annual physical exam 04/02/2015  . Chronic radicular pain of lower back 03/13/2014  . Reduced vision 03/13/2014  . Essential hypertension 03/08/2014  . Obesity (BMI 30.0-34.9) 02/19/2010  . Vitamin D deficiency 07/27/2009  . Hyperlipidemia LDL goal <100 02/19/2008  . Disorder of bladder 02/19/2008   Yvonne Brewer, LPTA/CLT; CBIS [phone]  Yvonne Brewer 08/10/2020, 1:09 PM  [place], [place], [address] Phone: [phone]   Fax:  [phone]  Name: Yvonne Brewer MRN: [id number] Date of Birth: [birth year]

## 2020-08-11 ENCOUNTER — Encounter: Payer: Medicare HMO | Admitting: Family Medicine

## 2020-08-15 ENCOUNTER — Encounter (HOSPITAL_COMMUNITY): Payer: Self-pay

## 2020-08-15 ENCOUNTER — Other Ambulatory Visit: Payer: Self-pay

## 2020-08-15 ENCOUNTER — Encounter (HOSPITAL_COMMUNITY): Payer: PPO

## 2020-08-15 ENCOUNTER — Ambulatory Visit (HOSPITAL_COMMUNITY): Payer: PPO

## 2020-08-15 ENCOUNTER — Encounter: Payer: Self-pay | Admitting: Family Medicine

## 2020-08-15 ENCOUNTER — Ambulatory Visit (INDEPENDENT_AMBULATORY_CARE_PROVIDER_SITE_OTHER): Payer: PPO | Admitting: Family Medicine

## 2020-08-15 ENCOUNTER — Telehealth (HOSPITAL_COMMUNITY): Payer: Self-pay

## 2020-08-15 VITALS — BP 136/81 | Ht 62.0 in | Wt 186.0 lb

## 2020-08-15 DIAGNOSIS — Z0000 Encounter for general adult medical examination without abnormal findings: Secondary | ICD-10-CM | POA: Diagnosis not present

## 2020-08-15 DIAGNOSIS — M25512 Pain in left shoulder: Secondary | ICD-10-CM | POA: Diagnosis not present

## 2020-08-15 DIAGNOSIS — R29898 Other symptoms and signs involving the musculoskeletal system: Secondary | ICD-10-CM

## 2020-08-15 NOTE — Telephone Encounter (Signed)
pt has cancelled the remainder of her appts for PT stating that her foot feels better.

## 2020-08-15 NOTE — Patient Instructions (Signed)
[name] , Thank you for taking time to come for your Medicare Wellness Visit. I appreciate your ongoing commitment to your health goals. Please review the following plan we discussed and let me know if I can assist you in the future.   Please continue to practice social distancing to keep you, your family, and our community safe.  If you must go out, please wear a Mask and practice good handwashing.  Screening recommendations/referrals: Colonoscopy: Due in 2023 Mammogram: Up to date Bone Density: Due  Recommended yearly ophthalmology/optometry visit for glaucoma screening and checkup Recommended yearly dental visit for hygiene and checkup  Vaccinations: Influenza vaccine: up to date Pneumococcal vaccine: up to date  Tdap vaccine: due Shingles vaccine: up to date     Advanced directives: In process of doing them now- let us know if you have questions about the healthcare aspect of this.  Conditions/risks identified: BP and Falls  Next appointment: 01/08/2021   Preventive Care 65 Years and Older, Female Preventive care refers to lifestyle choices and visits with your health care provider that can promote health and wellness. What does preventive care include?  A yearly physical exam. This is also called an annual well check.  Dental exams once or twice a year.  Routine eye exams. Ask your health care provider how often you should have your eyes checked.  Personal lifestyle choices, including:  Daily care of your teeth and gums.  Regular physical activity.  Eating a healthy diet.  Avoiding tobacco and drug use.  Limiting alcohol use.  Practicing safe sex.  Taking low-dose aspirin every day.  Taking vitamin and mineral supplements as recommended by your health care provider. What happens during an annual well check? The services and screenings done by your health care provider during your annual well check will depend on your age, overall health, lifestyle risk  factors, and family history of disease. Counseling  Your health care provider may ask you questions about your:  Alcohol use.  Tobacco use.  Drug use.  Emotional well-being.  Home and relationship well-being.  Sexual activity.  Eating habits.  History of falls.  Memory and ability to understand (cognition).  Work and work Statistician.  Reproductive health. Screening  You may have the following tests or measurements:  Height, weight, and BMI.  Blood pressure.  Lipid and cholesterol levels. These may be checked every 5 years, or more frequently if you are over 34 years old.  Skin check.  Lung cancer screening. You may have this screening every year starting at age 9 if you have a 30-pack-year history of smoking and currently smoke or have quit within the past 15 years.  Fecal occult blood test (FOBT) of the stool. You may have this test every year starting at age 54.  Flexible sigmoidoscopy or colonoscopy. You may have a sigmoidoscopy every 5 years or a colonoscopy every 10 years starting at age 78.  Hepatitis C blood test.  Hepatitis B blood test.  Sexually transmitted disease (STD) testing.  Diabetes screening. This is done by checking your blood sugar (glucose) after you have not eaten for a while (fasting). You may have this done every 1-3 years.  Bone density scan. This is done to screen for osteoporosis. You may have this done starting at age 79.  Mammogram. This may be done every 1-2 years. Talk to your health care provider about how often you should have regular mammograms. Talk with your health care provider about your test results, treatment options,  and if necessary, the need for more tests. Vaccines  Your health care provider may recommend certain vaccines, such as:  Influenza vaccine. This is recommended every year.  Tetanus, diphtheria, and acellular pertussis (Tdap, Td) vaccine. You may need a Td booster every 10 years.  Zoster vaccine. You  may need this after age 51.  Pneumococcal 13-valent conjugate (PCV13) vaccine. One dose is recommended after age 73.  Pneumococcal polysaccharide (PPSV23) vaccine. One dose is recommended after age 94. Talk to your health care provider about which screenings and vaccines you need and how often you need them. This information is not intended to replace advice given to you by your health care provider. Make sure you discuss any questions you have with your health care provider. Document Released: 09/08/2015 Document Revised: 05/01/2016 Document Reviewed: 06/13/2015 Elsevier Interactive Patient Education  2017 [employer] Prevention in the Home Falls can cause injuries. They can happen to people of all ages. There are many things you can do to make your home safe and to help prevent falls. What can I do on the outside of my home?  Regularly fix the edges of walkways and driveways and fix any cracks.  Remove anything that might make you trip as you walk through a door, such as a raised step or threshold.  Trim any bushes or trees on the path to your home.  Use bright outdoor lighting.  Clear any walking paths of anything that might make someone trip, such as rocks or tools.  Regularly check to see if handrails are loose or broken. Make sure that both sides of any steps have handrails.  Any raised decks and porches should have guardrails on the edges.  Have any leaves, snow, or ice cleared regularly.  Use sand or salt on walking paths during winter.  Clean up any spills in your garage right away. This includes oil or grease spills. What can I do in the bathroom?  Use night lights.  Install grab bars by the toilet and in the tub and shower. Do not use towel bars as grab bars.  Use non-skid mats or decals in the tub or shower.  If you need to sit down in the shower, use a plastic, non-slip stool.  Keep the floor dry. Clean up any water that spills on the floor as soon as  it happens.  Remove soap buildup in the tub or shower regularly.  Attach bath mats securely with double-sided non-slip rug tape.  Do not have throw rugs and other things on the floor that can make you trip. What can I do in the bedroom?  Use night lights.  Make sure that you have a light by your bed that is easy to reach.  Do not use any sheets or blankets that are too big for your bed. They should not hang down onto the floor.  Have a firm chair that has side arms. You can use this for support while you get dressed.  Do not have throw rugs and other things on the floor that can make you trip. What can I do in the kitchen?  Clean up any spills right away.  Avoid walking on wet floors.  Keep items that you use a lot in easy-to-reach places.  If you need to reach something above you, use a strong step stool that has a grab bar.  Keep electrical cords out of the way.  Do not use floor polish or wax that makes floors slippery. If  you must use wax, use non-skid floor wax.  Do not have throw rugs and other things on the floor that can make you trip. What can I do with my stairs?  Do not leave any items on the stairs.  Make sure that there are handrails on both sides of the stairs and use them. Fix handrails that are broken or loose. Make sure that handrails are as long as the stairways.  Check any carpeting to make sure that it is firmly attached to the stairs. Fix any carpet that is loose or worn.  Avoid having throw rugs at the top or bottom of the stairs. If you do have throw rugs, attach them to the floor with carpet tape.  Make sure that you have a light switch at the top of the stairs and the bottom of the stairs. If you do not have them, ask someone to add them for you. What else can I do to help prevent falls?  Wear shoes that:  Do not have high heels.  Have rubber bottoms.  Are comfortable and fit you well.  Are closed at the toe. Do not wear sandals.  If  you use a stepladder:  Make sure that it is fully opened. Do not climb a closed stepladder.  Make sure that both sides of the stepladder are locked into place.  Ask someone to hold it for you, if possible.  Clearly mark and make sure that you can see:  Any grab bars or handrails.  First and last steps.  Where the edge of each step is.  Use tools that help you move around (mobility aids) if they are needed. These include:  Canes.  Walkers.  Scooters.  Crutches.  Turn on the lights when you go into a dark area. Replace any light bulbs as soon as they burn out.  Set up your furniture so you have a clear path. Avoid moving your furniture around.  If any of your floors are uneven, fix them.  If there are any pets around you, be aware of where they are.  Review your medicines with your doctor. Some medicines can make you feel dizzy. This can increase your chance of falling. Ask your doctor what other things that you can do to help prevent falls. This information is not intended to replace advice given to you by your health care provider. Make sure you discuss any questions you have with your health care provider. Document Released: 06/08/2009 Document Revised: 01/18/2016 Document Reviewed: 09/16/2014 Elsevier Interactive Patient Education  2017 [employer].

## 2020-08-15 NOTE — Therapy (Signed)
[place], [place], [address] Phone: [phone]   Fax:  [phone]  Occupational Therapy Treatment  Patient Details  Name: Yvonne Brewer MRN: [id number] Date of Birth: July 11, 1946 Referring Provider (OT): Dr. Sanjuana Kava   Encounter Date: 08/15/2020   OT End of Session - 08/15/20 1514    Visit Number 3    Number of Visits 5    Date for OT Re-Evaluation 08/31/20    Authorization Type 1) [employer] $15 copay 2) [employer]BS    Authorization Time Period No visit limit    OT Start Time 1303    OT Stop Time 1341    OT Time Calculation (min) 38 min    Activity Tolerance Patient tolerated treatment well    Behavior During Therapy [facility] for tasks assessed/performed           Past Medical History:  Diagnosis Date  . Essential hypertension 2015  . Female bladder prolapse   . History of cystitis   . History of skin cancer   . Hyperlipidemia   . Obesity     Past Surgical History:  Procedure Laterality Date  . CATARACT EXTRACTION  2012   bilateral   . COLONOSCOPY  11/06/2011   Procedure: COLONOSCOPY;  Surgeon: Rogene Houston, MD;  [facility];  Service: Endoscopy;  Laterality: N/A;  7:30  . COLONOSCOPY N/A 05/21/2017   Procedure: COLONOSCOPY;  Surgeon: Rogene Houston, MD;  [facility];  Service: Endoscopy;  Laterality: N/A;  730-moved to 9/26 @10 :30am per Lelon Frohlich  . EYE SURGERY  2011   bilateral cataract with implants  . HAMMER TOE SURGERY Left 01/2016  . Left bunionectomy    . Squamous cell ca rt ant chest 1989    . YAG LASER APPLICATION Left 10/21/3333   Procedure: YAG LASER APPLICATION;  Surgeon: Rutherford Guys, MD;  Location: [facility];  Service: Ophthalmology;  Laterality: Left;  . YAG LASER APPLICATION Right 4/56/2563   Procedure: YAG LASER APPLICATION;  Surgeon: Rutherford Guys, MD;  Location: [facility];  Service: Ophthalmology;  Laterality: Right;    There were no vitals filed for  this visit.   Subjective Assessment - 08/15/20 1512    Subjective  S: My foot has gotten so much better since that therapist suggested the inserts. My shoulder is the same. Maybe a little improvement; I don't know.    Currently in Pain? Yes    Pain Score 4     Pain Location Shoulder    Pain Orientation Left    Pain Descriptors / Indicators Aching;Sore    Pain Type Chronic pain    Pain Onset More than a month ago    Pain Frequency Intermittent    Aggravating Factors  overuse    Pain Relieving Factors pain medication, ice/cold therapy    Effect of Pain on Daily Activities min-mod effect    Multiple Pain Sites No              [facility] OT Assessment - 08/15/20 1322      Assessment   Medical Diagnosis left shoulder pain      Precautions   Precautions None                    OT Treatments/Exercises (OP) - 08/15/20 1322      Exercises   Exercises Shoulder      Shoulder Exercises: Standing   Protraction Strengthening;12 reps    Protraction  Weight (lbs) 1    Horizontal ABduction Strengthening;12 reps    Horizontal ABduction Weight (lbs) 1    External Rotation Strengthening;12 reps    External Rotation Weight (lbs) 2    Internal Rotation Strengthening;12 reps    Internal Rotation Weight (lbs) 2    Flexion Strengthening;12 reps   to shoulder level only   Shoulder Flexion Weight (lbs) 2    ABduction Strengthening;12 reps   to shoulder level   Shoulder ABduction Weight (lbs) 2    Extension Theraband;12 reps    Theraband Level (Shoulder Extension) Level 2 (Red)    Row Theraband;12 reps   palms up   Theraband Level (Shoulder Row) Level 2 (Red)    Retraction Theraband;12 reps    Theraband Level (Shoulder Retraction) Level 2 (Red)      Shoulder Exercises: ROM/Strengthening   Over Head Lace seated 2'    Proximal Shoulder Strengthening, Seated 12X A/ROM      Manual Therapy   Manual Therapy Myofascial release    Manual therapy comments manual therapy completed prior  to exercises.    Myofascial Release Myofascial release completed to left upper arm, trapezius region.                    OT Short Term Goals - 08/08/20 1306      OT SHORT TERM GOAL #1   Title Pt will be provided with and educated on HEP to improve LUE functional use as non-dominant during ADLs.    Time 4    Period Weeks    Status On-going    Target Date 08/31/20      OT SHORT TERM GOAL #2   Title Pt will decrease pain in LUE to 3/10 or less to improve ability to sleep on her left side for 4+ hours without waking due to pain.    Time 4    Period Weeks    Status On-going      OT SHORT TERM GOAL #3   Title Pt will decrease LUE fascial restrictions to improve ability to perform functional reaching during dressing.    Time 4    Period Weeks    Status On-going      OT SHORT TERM GOAL #4   Title Pt will improve LUE shoulder stability required for participation in water aerobics.    Time 4    Period Weeks    Status On-going                    Plan - 08/15/20 1515    Clinical Impression Statement A: No passive stretching needed this date. Added low wieght (1-2lbs) for shoulder/scapular strengthening. Added red theraband for scapular strengthening with VC and tactile cues for form and technique. patient had difficulty with determining her level of pain tolerance and if she should put through the pain versus not. Pt voices difficulty with deferentiating safe pain or discomfort versus pain that was harmful. Encouraged patient to voice if any exercise was too painful to complete.    Body Structure / Function / Physical Skills ADL;Endurance;UE functional use;Fascial restriction;Pain;ROM;IADL;Strength    Plan P: reassess and determine if discharge is appropriate.    Consulted and Agree with Plan of Care Patient           Patient will benefit from skilled therapeutic intervention in order to improve the following deficits and impairments:   Body Structure / Function /  Physical Skills: ADL,Endurance,UE functional use,Fascial restriction,Pain,ROM,IADL,Strength  Visit Diagnosis: Other symptoms and signs involving the musculoskeletal system  Acute pain of left shoulder    Problem List Patient Active Problem List   Diagnosis Date Noted  . Left shoulder pain 07/10/2020  . Anxiety 07/10/2019  . Heartburn 07/07/2019  . Educated about COVID-19 virus infection 01/10/2019  . Fracture, foot, left, sequela 02/09/2018  . Dyspepsia 04/18/2017  . Chest pain 04/18/2017  . Osteoarthritis of both hips 04/18/2017  . History of colonic polyps 11/13/2016  . Pseudophakia 11/12/2016  . Urinary incontinence in female 07/29/2016  . Environmental allergies 07/14/2016  . Annual physical exam 04/02/2015  . Chronic radicular pain of lower back 03/13/2014  . Reduced vision 03/13/2014  . Essential hypertension 03/08/2014  . Obesity (BMI 30.0-34.9) 02/19/2010  . Vitamin D deficiency 07/27/2009  . Hyperlipidemia LDL goal <100 02/19/2008  . Disorder of bladder 02/19/2008   Ailene Ravel, OTR/L,CBIS  [id number]   08/15/2020, 3:18 PM  [place], [place], [address] Phone: [phone]   Fax:  [phone]  Name: Yvonne Brewer MRN: [id number] Date of Birth: 06-Feb-1946

## 2020-08-15 NOTE — Progress Notes (Signed)
Subjective:   Yvonne Brewer is a 74 y.o. female who presents for Medicare Annual (Subsequent) preventive examination.  Participants: CMA for intake and work up; Patient and Provider for Visit and Wrap up  Method of visit: Telephone  Location of Patient: Home Location of Provider: Office Consent was obtain for visit over the telephone. Services rendered by provider: Visit was performed via telephone  I verified that I am speaking with the correct person using two identifiers.  Review of Systems     Cardiac Risk Factors include: advanced age (>77men, >45 women);hypertension     Objective:    Today's Vitals   08/15/20 1042  PainSc: 2    There is no height or weight on file to calculate BMI.  Advanced Directives 08/07/2020 08/01/2020 08/10/2018 05/21/2017 05/16/2016 03/08/2014 11/06/2011  Does Patient Have a Medical Advance Directive? No No No Yes No Patient would like information Patient has advance directive, copy not in chart  Type of Advance Directive - - - [place];Living will - - Living will  Copy of [place] in Chart? - - - - - - Copy requested from family  Would patient like information on creating a medical advance directive? No - Patient declined No - Patient declined Yes (ED - Information included in AVS) - - - -  Pre-existing out of facility DNR order (yellow form or pink MOST form) - - - - - - No    Current Medications (verified) Outpatient Encounter Medications as of 08/15/2020  Medication Sig   amLODipine (NORVASC) 5 MG tablet TAKE 1 TABLET BY MOUTH EVERY DAY   calcium elemental as carbonate (BARIATRIC TUMS ULTRA) 400 MG chewable tablet Chew 1,000 mg by mouth 3 (three) times daily. (Patient not taking: Reported on 08/15/2020)   [DISCONTINUED] clotrimazole-betamethasone (LOTRISONE) cream APPLY TWO TIMES DAILY TO DRY SCALY , ITCHY RASHES FOR 1 WEEK, THEN , AS NEEDED (Patient not taking: Reported on 08/15/2020)    [DISCONTINUED] omeprazole (PRILOSEC) 20 MG capsule Take 1 capsule (20 mg total) by mouth daily. (Patient not taking: Reported on 08/15/2020)   No facility-administered encounter medications on file as of 08/15/2020.    Allergies (verified) Ace inhibitors, Penicillins, and Sulfonamide derivatives   History: Past Medical History:  Diagnosis Date   Essential hypertension 2015   Female bladder prolapse    History of cystitis    History of skin cancer    Hyperlipidemia    Obesity    Past Surgical History:  Procedure Laterality Date   CATARACT EXTRACTION  2012   bilateral    COLONOSCOPY  11/06/2011   Procedure: COLONOSCOPY;  Surgeon: Rogene Houston, MD;  [facility];  Service: Endoscopy;  Laterality: N/A;  7:30   COLONOSCOPY N/A 05/21/2017   Procedure: COLONOSCOPY;  Surgeon: Rogene Houston, MD;  [facility];  Service: Endoscopy;  Laterality: N/A;  730-moved to 9/26 @10 :30am per [facility]   EYE SURGERY  2011   bilateral cataract with implants   HAMMER TOE SURGERY Left 01/2016   Left bunionectomy     Squamous cell ca rt ant chest 1989     YAG LASER APPLICATION Left 12/20/621   Procedure: YAG LASER APPLICATION;  Surgeon: Rutherford Guys, MD;  Location: [facility];  Service: Ophthalmology;  Laterality: Left;   YAG LASER APPLICATION Right 7/62/8315   Procedure: YAG LASER APPLICATION;  Surgeon: Rutherford Guys, MD;  Location[facility];  Service: Ophthalmology;  Laterality: Right;   Family History  Problem Relation Age of Onset   Stroke Mother    Hypertension Mother    Heart disease Father    Cancer Maternal Grandmother    Anesthesia problems Neg Hx    Hypotension Neg Hx    Malignant hyperthermia Neg Hx    Pseudochol deficiency Neg Hx    Social History   Socioeconomic History   Marital status: Married    Spouse name: Gerilyn Pilgrim    Number of children: 2   Years of education: 16   Highest education level: Master's degree (e.g., MA, MS, MEng, MEd,  MSW, MBA)  Occupational History   Not on file  Tobacco Use   Smoking status: Never Smoker   Smokeless tobacco: Never Used  Vaping Use   Vaping Use: Never used  Substance and Sexual Activity   Alcohol use: Yes    Comment: glass of red wine most nights or scotch and water   Drug use: No   Sexual activity: Not Currently    Birth control/protection: Post-menopausal  Other Topics Concern   Not on file  Social History Narrative   Lives alone with  husband    Social Determinants of Health   Financial Resource Strain: Low Risk    Difficulty of Paying Living Expenses: Not hard at all  Food Insecurity: No Food Insecurity   Worried About Programme researcher, broadcasting/film/video in the Last Year: Never true   Ran Out of Food in the Last Year: Never true  Transportation Needs: No Transportation Needs   Lack of Transportation (Medical): No   Lack of Transportation (Non-Medical): No  Physical Activity: Sufficiently Active   Days of Exercise per Week: 5 days   Minutes of Exercise per Session: 60 min  Stress: Stress Concern Present   Feeling of Stress : To some extent  Social Connections: Press photographer of Communication with Friends and Family: Twice a week   Frequency of Social Gatherings with Friends and Family: Once a week   Attends Religious Services: More than 4 times per year   Active Member of [employer] or Organizations: Yes   Attends Banker Meetings: Never   Marital Status: Married    Tobacco Counseling Counseling given: Yes   Clinical Intake:  Pre-visit preparation completed: Yes  Pain : 0-10 Pain Score: 2  Pain Type: Chronic pain Pain Location: Shoulder Pain Orientation: Left Pain Descriptors / Indicators: Dull,Tender Pain Onset: More than a month ago Pain Frequency: Constant Pain Relieving Factors: pain medication Effect of Pain on Daily Activities: no  Pain Relieving Factors: pain medication  Nutritional Risks: None Diabetes:  No  How often do you need to have someone help you when you read instructions, pamphlets, or other written materials from your doctor or pharmacy?: 1 - Never What is the last grade level you completed in school?: masters degree  Diabetic?  Interpreter Needed?: No      Activities of Daily Living In your present state of health, do you have any difficulty performing the following activities: 08/15/2020  Hearing? N  Vision? N  Difficulty concentrating or making decisions? N  Walking or climbing stairs? N  Dressing or bathing? N  Doing errands, shopping? N  Preparing Food and eating ? N  Using the Toilet? N  In the past six months, have you accidently leaked urine? N  Do you have problems with loss of bowel control? N  Managing your Medications? N  Managing your Finances? N  Housekeeping or managing your Housekeeping? N  Some recent data might be hidden    Patient Care Team: Kerri Perches, MD as PCP - General Nita Sells, MD (Dermatology) Malissa Hippo, MD as Consulting Physician (Gastroenterology) Darreld Mclean, MD as Consulting Physician (Orthopedic Surgery) Jethro Bolus, MD as Consulting Physician (Ophthalmology) Toni Arthurs, MD as Consulting Physician (Orthopedic Surgery)  Indicate any recent Medical Services you may have received from other than [facility] providers in the past year (date may be approximate).     Assessment:   This is a routine wellness examination for [facility].  Hearing/Vision screen No exam data present  Dietary issues and exercise activities discussed: Current Exercise Habits: Home exercise routine, Type of exercise: Other - see comments (water aerobics), Time (Minutes): 60, Frequency (Times/Week): 5, Weekly Exercise (Minutes/Week): 300, Intensity: Moderate, Exercise limited by: None identified  Goals     Weight (lb) < 200 lb (90.7 kg)      Depression Screen PHQ 2/9 Scores 08/15/2020 08/15/2020 08/12/2019 07/07/2019 04/05/2019 08/10/2018  06/15/2018  PHQ - 2 Score 0 0 0 0 0 0 0  PHQ- 9 Score - - - - - - -    Fall Risk Fall Risk  08/15/2020 07/10/2020 03/21/2020 08/12/2019 07/07/2019  Falls in the past year? 0 0 0 0 0  Comment - - Emmi Telephone Survey: data to providers prior to load - -  Number falls in past yr: - 0 - 0 0  Injury with Fall? - 0 - 0 0  Risk for fall due to : - - - - -  Follow up - - - Falls evaluation completed;Education provided;Falls prevention discussed -    FALL RISK PREVENTION PERTAINING TO THE HOME:  Any stairs in or around the home? Yes  If so, are there any without handrails? No  Home free of loose throw rugs in walkways, pet beds, electrical cords, etc? No  Adequate lighting in your home to reduce risk of falls? Yes   ASSISTIVE DEVICES UTILIZED TO PREVENT FALLS:  Life alert? No  Use of a cane, walker or w/c? Yes  Grab bars in the bathroom? yes Shower chair or bench in shower? Yes  Elevated toilet seat or a handicapped toilet? No     Cognitive Function: MMSE - Mini Mental State Exam 07/29/2016  Orientation to time 5  Orientation to Place 5  Registration 3  Attention/ Calculation 5  Recall 3  Language- name 2 objects 2  Language- repeat 1  Language- follow 3 step command 3  Language- read & follow direction 1  Write a sentence 1  Copy design 1  Total score 30     6CIT Screen 08/15/2020 08/12/2019 08/10/2018  What Year? 0 points 0 points 0 points  What month? 0 points 0 points 0 points  What time? 0 points 0 points 0 points  Count back from 20 0 points 0 points 0 points  Months in reverse 0 points 0 points 0 points  Repeat phrase 0 points 0 points 2 points  Total Score 0 0 2    Immunizations Immunization History  Administered Date(s) Administered   Fluad Quad(high Dose 65+) 04/19/2019, 06/01/2020   Hep A / Hep B 03/09/2018   Hepatitis A 12/29/2002, 09/20/2003   Influenza Split 05/23/2014   Influenza Whole 10/13/2008, 06/06/2009   Influenza-Unspecified  05/16/2017, 05/26/2018   PFIZER SARS-COV-2 Vaccination 09/16/2019, 10/07/2019, 05/23/2020   Pneumococcal Conjugate-13 09/06/2014   Pneumococcal Polysaccharide-23 12/31/2010   Td 12/15/2002   Typhoid Live 03/09/2018   Zoster 09/01/2006  Zoster Recombinat (Shingrix) 07/09/2019, 11/18/2019    TDAP status: Due, Education has been provided regarding the importance of this vaccine. Advised may receive this vaccine at local pharmacy or Health Dept. Aware to provide a copy of the vaccination record if obtained from local pharmacy or Health Dept. Verbalized acceptance and understanding.  Flu Vaccine status: Up to date  Pneumococcal vaccine status: Up to date  Covid-19 vaccine status: Completed vaccines  Qualifies for Shingles Vaccine? Yes   Zostavax completed Yes   Shingrix Completed?: Yes  Screening Tests Health Maintenance  Topic Date Due   TETANUS/TDAP  12/14/2012   COLONOSCOPY  05/21/2022   MAMMOGRAM  07/31/2022   INFLUENZA VACCINE  Completed   DEXA SCAN  Completed   COVID-19 Vaccine  Completed   Hepatitis C Screening  Completed   PNA vac Low Risk Adult  Completed    Health Maintenance  Health Maintenance Due  Topic Date Due   TETANUS/TDAP  12/14/2012    Colorectal cancer screening: Type of screening: Colonoscopy. Completed 05/21/2017. Repeat every 5 years  Mammogram status: Completed 07/31/20. Repeat every year  Bone Density status: Completed 04/16/2018. Results reflect: Bone density results: NORMAL. Repeat every 2 years.  Lung Cancer Screening: (Low Dose CT Chest recommended if Age 25-80 years, 30 pack-year currently smoking OR have quit w/in 15years.) does not qualify.     Additional Screening:  Hepatitis C Screening: does not qualify; Completed 07/27/2015  Vision Screening: Recommended annual ophthalmology exams for early detection of glaucoma and other disorders of the eye. Is the patient up to date with their annual eye exam?  Yes  Who is  the provider or what is the name of the office in which the patient attends annual eye exams? [facility] If pt is not established with a provider, would they like to be referred to a provider to establish care? No .   Dental Screening: Recommended annual dental exams for proper oral hygiene  Community Resource Referral / Chronic Care Management: CRR required this visit?  Yes   CCM required this visit?  No      Plan:    1. Encounter for Medicare annual wellness exam  I have personally reviewed and noted the following in the patients chart:    Medical and social history  Use of alcohol, tobacco or illicit drugs   Current medications and supplements  Functional ability and status  Nutritional status  Physical activity  Advanced directives  List of other physicians  Hospitalizations, surgeries, and ER visits in previous 12 months  Vitals  Screenings to include cognitive, depression, and falls  Referrals and appointments  In addition, I have reviewed and discussed with patient certain preventive protocols, quality metrics, and best practice recommendations. A written personalized care plan for preventive services as well as general preventive health recommendations were provided to patient.     Perlie Mayo, NP   08/15/2020   Agreed with the above documentation. Telephone visit was 30 mins from start to finish. Patient was at home, Provider (I) was in the office.

## 2020-08-17 ENCOUNTER — Encounter (HOSPITAL_COMMUNITY): Payer: PPO

## 2020-08-21 ENCOUNTER — Ambulatory Visit (HOSPITAL_COMMUNITY): Payer: PPO | Admitting: Occupational Therapy

## 2020-08-22 ENCOUNTER — Ambulatory Visit (HOSPITAL_COMMUNITY): Payer: PPO | Admitting: Occupational Therapy

## 2020-08-22 ENCOUNTER — Encounter (HOSPITAL_COMMUNITY): Payer: Self-pay | Admitting: Occupational Therapy

## 2020-08-22 ENCOUNTER — Encounter (HOSPITAL_COMMUNITY): Payer: PPO | Admitting: Physical Therapy

## 2020-08-22 ENCOUNTER — Encounter (HOSPITAL_COMMUNITY): Payer: PPO | Admitting: Specialist

## 2020-08-22 ENCOUNTER — Other Ambulatory Visit: Payer: Self-pay

## 2020-08-22 DIAGNOSIS — M25512 Pain in left shoulder: Secondary | ICD-10-CM | POA: Diagnosis not present

## 2020-08-22 DIAGNOSIS — R29898 Other symptoms and signs involving the musculoskeletal system: Secondary | ICD-10-CM

## 2020-08-22 NOTE — Therapy (Signed)
[place], [place], [address] Phone: [phone]   Fax:  [phone]  Occupational Therapy Reassessment, Treatment, Discharge Summary  Patient Details  Name: Yvonne Brewer MRN: [id number] Date of Birth: 1945-10-07 Referring Provider (OT): Dr. Sanjuana Kava   Encounter Date: 08/22/2020   OT End of Session - 08/22/20 1717    Visit Number 4    Number of Visits 5    Date for OT Re-Evaluation 08/31/20    Authorization Type 1) [employer] $15 copay 2) [employer]BS    Authorization Time Period No visit limit    OT Start Time 1644    OT Stop Time 1715    OT Time Calculation (min) 31 min    Activity Tolerance Patient tolerated treatment well    Behavior During Therapy [facility] for tasks assessed/performed           Past Medical History:  Diagnosis Date  . Essential hypertension 2015  . Female bladder prolapse   . History of cystitis   . History of skin cancer   . Hyperlipidemia   . Obesity     Past Surgical History:  Procedure Laterality Date  . CATARACT EXTRACTION  2012   bilateral   . COLONOSCOPY  11/06/2011   Procedure: COLONOSCOPY;  Surgeon: Rogene Houston, MD;  [facility];  Service: Endoscopy;  Laterality: N/A;  7:30  . COLONOSCOPY N/A 05/21/2017   Procedure: COLONOSCOPY;  Surgeon: Rogene Houston, MD;  [facility];  Service: Endoscopy;  Laterality: N/A;  730-moved to 9/26 _0 :30am per Lelon Frohlich  . EYE SURGERY  2011   bilateral cataract with implants  . HAMMER TOE SURGERY Left 01/2016  . Left bunionectomy    . Squamous cell ca rt ant chest 1989    . YAG LASER APPLICATION Left 10/14/7586   Procedure: YAG LASER APPLICATION;  Surgeon: Rutherford Guys, MD;  Location: [facility];  Service: Ophthalmology;  Laterality: Left;  . YAG LASER APPLICATION Right 11/17/4980   Procedure: YAG LASER APPLICATION;  Surgeon: Rutherford Guys, MD;  Location: [facility];  Service: Ophthalmology;  Laterality: Right;     There were no vitals filed for this visit.   Subjective Assessment - 08/22/20 1646    Subjective  S: I've been doing a lot of water aerobics.    Currently in Pain? Yes    Pain Score 3     Pain Location Shoulder    Pain Orientation Left    Pain Descriptors / Indicators Sore    Pain Type Chronic pain    Pain Radiating Towards N/A    Pain Onset More than a month ago    Pain Frequency Intermittent    Aggravating Factors  overuse    Pain Relieving Factors pain medication, ice/cold therapy    Effect of Pain on Daily Activities min-mod effect    Multiple Pain Sites No              [facility] OT Assessment - 08/22/20 1641      Assessment   Medical Diagnosis left shoulder pain      Precautions   Precautions None      AROM   Overall AROM Comments Assessed seated, er/IR adducted    AROM Assessment Site Shoulder    Left Shoulder Flexion 150 Degrees   140 previous   Left Shoulder ABduction 165 Degrees   139 previous   Left Shoulder Internal Rotation 90 Degrees   same as previous  Left Shoulder External Rotation 80 Degrees   77 previous     Strength   Right/Left Shoulder Left    Left Shoulder Flexion 5/5   4+/5 previous   Left Shoulder ABduction 5/5   4/5 previous   Left Shoulder Internal Rotation 5/5   same as previous   Left Shoulder External Rotation 4+/5   same as previous                   OT Treatments/Exercises (OP) - 08/22/20 1700      Exercises   Exercises Shoulder      Shoulder Exercises: Standing   Protraction Strengthening;12 reps    Protraction Weight (lbs) 1    Horizontal ABduction Strengthening;12 reps    Horizontal ABduction Weight (lbs) 1    External Rotation Strengthening;12 reps    External Rotation Weight (lbs) 1    Internal Rotation Strengthening;12 reps    Internal Rotation Weight (lbs) 1    Flexion Strengthening;12 reps    Shoulder Flexion Weight (lbs) 1    ABduction Strengthening;12 reps    Shoulder ABduction Weight (lbs) 1                   OT Education - 08/22/20 1700    Education Details shoulder strengthening using 1-2# weights    Person(s) Educated Patient    Methods Explanation;Demonstration;Handout    Comprehension Verbalized understanding;Returned demonstration            OT Short Term Goals - 08/22/20 1655      OT SHORT TERM GOAL #1   Title Pt will be provided with and educated on HEP to improve LUE functional use as non-dominant during ADLs.    Time 4    Period Weeks    Status Achieved    Target Date 08/31/20      OT SHORT TERM GOAL #2   Title Pt will decrease pain in LUE to 3/10 or less to improve ability to sleep on her left side for 4+ hours without waking due to pain.    Time 4    Period Weeks    Status Achieved      OT SHORT TERM GOAL #3   Title Pt will decrease LUE fascial restrictions to improve ability to perform functional reaching during dressing.    Time 4    Period Weeks    Status Achieved      OT SHORT TERM GOAL #4   Title Pt will improve LUE shoulder stability required for participation in water aerobics.    Time 4    Period Weeks    Status Achieved                    Plan - 08/22/20 1704    Clinical Impression Statement A: Reassessment completed this session, pt has met all goals and has improved ROM, strength, and shoulder stability required for ADLs and leisure participation. Discussed progress and current functioning with pt and updated HEP for shoulder strengthening. Discussed altering water aerobics to every other day with and without the weights to decrease stress on the shoulder during class. Educated on pain management and the benefits of mobility for shoulder pain and to prevent frozen shoulder. Pt verbalized understanding, updated HEP, pt is agreeable to discharge today.    Body Structure / Function / Physical Skills ADL;Endurance;UE functional use;Fascial restriction;Pain;ROM;IADL;Strength    Plan P: Discharge pt    OT Home Exercise Plan 12/7:  shoulder stretches; 12/28: shoulder  strengthening with 1#    Consulted and Agree with Plan of Care Patient           Patient will benefit from skilled therapeutic intervention in order to improve the following deficits and impairments:   Body Structure / Function / Physical Skills: ADL,Endurance,UE functional use,Fascial restriction,Pain,ROM,IADL,Strength       Visit Diagnosis: Other symptoms and signs involving the musculoskeletal system  Acute pain of left shoulder    Problem List Patient Active Problem List   Diagnosis Date Noted  . Left shoulder pain 07/10/2020  . Anxiety 07/10/2019  . Heartburn 07/07/2019  . Educated about COVID-19 virus infection 01/10/2019  . Fracture, foot, left, sequela 02/09/2018  . Dyspepsia 04/18/2017  . Chest pain 04/18/2017  . Osteoarthritis of both hips 04/18/2017  . History of colonic polyps 11/13/2016  . Pseudophakia 11/12/2016  . Urinary incontinence in female 07/29/2016  . Environmental allergies 07/14/2016  . Annual physical exam 04/02/2015  . Chronic radicular pain of lower back 03/13/2014  . Reduced vision 03/13/2014  . Essential hypertension 03/08/2014  . Obesity (BMI 30.0-34.9) 02/19/2010  . Vitamin D deficiency 07/27/2009  . Hyperlipidemia LDL goal <100 02/19/2008  . Disorder of bladder 02/19/2008   Guadelupe Sabin, OTR/L  [phone] 08/22/2020, 5:18 PM  [place], [place], [address] Phone: [phone]   Fax:  [phone]  Name: Yvonne Brewer MRN: [id number] Date of Birth: Aug 16, 1946   OCCUPATIONAL THERAPY DISCHARGE SUMMARY  Visits from Start of Care: 4  Current functional level related to goals / functional outcomes: See above. Pt has met all goals and has improved ROM, strength, and functional use of LUE during daily tasks.    Remaining deficits: Pain/soreness in LUE, worse with extended use   Education / Equipment: HEP for stretching  and LUE strengthening  Plan: Patient agrees to discharge.  Patient goals were met. Patient is being discharged due to meeting the stated rehab goals.  ?????

## 2020-08-22 NOTE — Patient Instructions (Signed)

## 2020-08-23 ENCOUNTER — Encounter (HOSPITAL_COMMUNITY): Payer: Self-pay | Admitting: Physical Therapy

## 2020-08-23 ENCOUNTER — Ambulatory Visit (HOSPITAL_COMMUNITY): Payer: PPO | Admitting: Physical Therapy

## 2020-08-23 DIAGNOSIS — R29898 Other symptoms and signs involving the musculoskeletal system: Secondary | ICD-10-CM

## 2020-08-23 DIAGNOSIS — M79671 Pain in right foot: Secondary | ICD-10-CM

## 2020-08-23 DIAGNOSIS — M25512 Pain in left shoulder: Secondary | ICD-10-CM | POA: Diagnosis not present

## 2020-08-23 DIAGNOSIS — R2689 Other abnormalities of gait and mobility: Secondary | ICD-10-CM

## 2020-08-23 DIAGNOSIS — M6281 Muscle weakness (generalized): Secondary | ICD-10-CM

## 2020-08-23 NOTE — Therapy (Signed)
[facility] [address] [place], [place], [address] Phone: [phone]   Fax:  [phone]  Physical Therapy Treatment/Discharge Summary  Patient Details  Name: Yvonne Brewer MRN: [id number] Date of Birth: [birth year] Referring Provider (PT): Sanjuana Kava MD   Encounter Date: 08/23/2020   PHYSICAL THERAPY DISCHARGE SUMMARY  Visits from Start of Care: 3  Current functional level related to goals / functional outcomes: See below   Remaining deficits: See below   Education / Equipment: See below  Plan: Patient agrees to discharge.  Patient goals were met. Patient is being discharged due to being pleased with the current functional level.  ?????        PT End of Session - 08/23/20 1318    Visit Number 3    Number of Visits 8    Date for PT Re-Evaluation 09/04/20    Authorization Type [employer] Secondary: BCBS (no visit limit, No auth)    PT Start Time 1318    PT Stop Time 1352    PT Time Calculation (min) 34 min    Activity Tolerance Patient tolerated treatment well    Behavior During Therapy WFL for tasks assessed/performed           Past Medical History:  Diagnosis Date  . Essential hypertension 2015  . Female bladder prolapse   . History of cystitis   . History of skin cancer   . Hyperlipidemia   . Obesity     Past Surgical History:  Procedure Laterality Date  . CATARACT EXTRACTION  2012   bilateral   . COLONOSCOPY  11/06/2011   Procedure: COLONOSCOPY;  Surgeon: Rogene Houston, MD;  [facility];  Service: Endoscopy;  Laterality: N/A;  7:30  . COLONOSCOPY N/A 05/21/2017   Procedure: COLONOSCOPY;  Surgeon: Rogene Houston, MD;  [facility];  Service: Endoscopy;  Laterality: N/A;  730-moved to 9/26 '@10' :30am per Lelon Frohlich  . EYE SURGERY  2011   bilateral cataract with implants  . HAMMER TOE SURGERY Left 01/2016  . Left bunionectomy    . Squamous cell ca rt ant chest  1989    . YAG LASER APPLICATION Left 6/38/7564   Procedure: YAG LASER APPLICATION;  Surgeon: Rutherford Guys, MD;  Location: [facility];  Service: Ophthalmology;  Laterality: Left;  . YAG LASER APPLICATION Right 3/32/9518   Procedure: YAG LASER APPLICATION;  Surgeon: Rutherford Guys, MD;  Location: [facility];  Service: Ophthalmology;  Laterality: Right;    There were no vitals filed for this visit.   Subjective Assessment - 08/23/20 1319    Subjective Patient states she is continuing to have foot pain. She got some foot inserts which she feels is helping. She had pain in her foot with standing at a funeral for about a half hour.    Patient Stated Goals Not limp    Currently in Pain? No/denies              [facility] PT Assessment - 08/23/20 0001      Assessment   Medical Diagnosis R foot pain    Referring Provider (PT) Sanjuana Kava MD    Onset Date/Surgical Date 05/29/20    Next MD Visit None scheduled     Prior Therapy None      Precautions   Precautions None      Restrictions   Weight Bearing Restrictions No      Prior Function   Level of Independence Independent  Vocation Retired    Leisure traveling, [employer]      Observation/Other Assessments   Observations Ambulates without AD, pes planus bilateral with R>L, R bunion                         [facility] Adult PT Treatment/Exercise - 08/23/20 0001      Ankle Exercises: Standing   Heel Raises 10 reps    Toe Raise 10 reps                  PT Education - 08/23/20 1325    Education Details Patient educated on HEP, exercise mechanics, discussed patients water aerobics, weightbearing vs. aquatics, walking program, gradual progression of activity, use of AD, bone density, strength training    Person(s) Educated Patient    Methods Explanation;Demonstration;Handout    Comprehension Verbalized understanding;Returned demonstration            PT Short Term Goals - 08/23/20 1341      PT SHORT TERM GOAL #1    Title Patient will be independent with HEP in order to improve functional outcomes.    Time 2    Period Weeks    Status Achieved    Target Date 08/21/20      PT SHORT TERM GOAL #2   Title Patient will report at least 25% improvement in symptoms for improved quality of life.    Time 2    Period Weeks    Status Achieved    Target Date 08/21/20             PT Long Term Goals - 08/23/20 1341      PT LONG TERM GOAL #1   Title Patient will report at least 75% improvement in symptoms for improved quality of life.    Baseline 70%    Time 4    Period Weeks    Status Partially Met      PT LONG TERM GOAL #2   Title Patient will be able to ambulate for at least 20 minutes with pain no greater than 1/10 in order to demonstrate improved ability to ambulate in the community.    Time 4    Period Weeks    Status Achieved      PT LONG TERM GOAL #3   Title Patient will improve R ankle DF to 5 degrees in order to improve gait pattern for community ambulation.    Time 4    Period Weeks    Status Deferred                 Plan - 08/23/20 1318    Clinical Impression Statement Patient has met all short and long term goals with ability to complete HEP and improved symptoms and ambulation with one goal being deferred for ROM as symptoms have improved. Discussed exercise at length with patient today and educated her as seen in education section above. Patient also educated on returning to physical therapy if needed. Patient discharged from skilled physical therapy at this time.    Personal Factors and Comorbidities Age;Fitness;Time since onset of injury/illness/exacerbation;Past/Current Experience    Examination-Activity Limitations Locomotion Level;Transfers;Stairs;Squat    Examination-Participation Restrictions Cleaning;Meal Prep;Yard Work;Volunteer;Shop;Community Activity    Stability/Clinical Decision Making Stable/Uncomplicated    PT Frequency 2x / week    PT Duration 4 weeks     PT Treatment/Interventions ADLs/Self Care Home Management;Aquatic Therapy;Cryotherapy;Electrical Stimulation;Iontophoresis 2m/ml Dexamethasone;Moist Heat;Traction;DME Instruction;Gait training;Stair training;Ultrasound;Functional mobility training;Therapeutic activities;Therapeutic exercise;Balance training;Neuromuscular re-education;Patient/family  education;Manual techniques;Compression bandaging;Dry needling;Energy conservation;Splinting;Taping;Spinal Manipulations;Joint Manipulations    PT Next Visit Plan f/u on HEP, continue posterior tib strengthening and perform resistance band, foot doming, balance exercises, foot/ankle strengthening, discuss good arch supports    PT Home Exercise Plan 12/13  inversion iso, calf stretch; 08/10/20: towel crunch, inv/ev wiht towel, 4 way GTB           Patient will benefit from skilled therapeutic intervention in order to improve the following deficits and impairments:  Abnormal gait,Difficulty walking,Decreased range of motion,Decreased endurance,Decreased activity tolerance,Decreased balance,Decreased mobility,Decreased strength,Pain,Improper body mechanics,Impaired flexibility  Visit Diagnosis: Pain in right foot  Muscle weakness (generalized)  Other abnormalities of gait and mobility  Other symptoms and signs involving the musculoskeletal system     Problem List Patient Active Problem List   Diagnosis Date Noted  . Left shoulder pain 07/10/2020  . Anxiety 07/10/2019  . Heartburn 07/07/2019  . Educated about COVID-19 virus infection 01/10/2019  . Fracture, foot, left, sequela 02/09/2018  . Dyspepsia 04/18/2017  . Chest pain 04/18/2017  . Osteoarthritis of both hips 04/18/2017  . History of colonic polyps 11/13/2016  . Pseudophakia 11/12/2016  . Urinary incontinence in female 07/29/2016  . Environmental allergies 07/14/2016  . Annual physical exam 04/02/2015  . Chronic radicular pain of lower back 03/13/2014  . Reduced vision 03/13/2014   . Essential hypertension 03/08/2014  . Obesity (BMI 30.0-34.9) 02/19/2010  . Vitamin D deficiency 07/27/2009  . Hyperlipidemia LDL goal <100 02/19/2008  . Disorder of bladder 02/19/2008    1:57 PM, 08/23/20 Mearl Latin PT, DPT Physical Therapist at [facility] [place], [place], [address] Phone: [phone]   Fax:  [phone]  Name: Yvonne Brewer MRN: [id number] Date of Birth: 06-13-1946

## 2020-08-23 NOTE — Patient Instructions (Signed)
Access Code: [email or web address] URL: [email or web address] Date: 08/23/2020 Prepared by: Greig Castilla Brittney Mucha  Exercises Heel rises with counter support - 1 x daily - 7 x weekly - 1 sets - 10 reps Toe Raises with Counter Support - 1 x daily - 7 x weekly - 1 sets - 10 reps

## 2020-08-24 ENCOUNTER — Other Ambulatory Visit: Payer: PPO

## 2020-08-24 DIAGNOSIS — Z20822 Contact with and (suspected) exposure to covid-19: Secondary | ICD-10-CM | POA: Diagnosis not present

## 2020-08-27 LAB — NOVEL CORONAVIRUS, NAA: SARS-CoV-2, NAA: NOT DETECTED

## 2020-08-29 ENCOUNTER — Ambulatory Visit (HOSPITAL_COMMUNITY): Payer: PPO | Admitting: Physical Therapy

## 2020-08-29 ENCOUNTER — Encounter (HOSPITAL_COMMUNITY): Payer: PPO

## 2020-08-29 ENCOUNTER — Encounter (HOSPITAL_COMMUNITY): Payer: Self-pay

## 2020-08-31 ENCOUNTER — Encounter (HOSPITAL_COMMUNITY): Payer: PPO | Admitting: Physical Therapy

## 2020-09-05 ENCOUNTER — Encounter (HOSPITAL_COMMUNITY): Payer: PPO | Admitting: Physical Therapy

## 2020-09-07 ENCOUNTER — Encounter (HOSPITAL_COMMUNITY): Payer: PPO | Admitting: Physical Therapy

## 2020-10-27 DIAGNOSIS — M67912 Unspecified disorder of synovium and tendon, left shoulder: Secondary | ICD-10-CM | POA: Diagnosis not present

## 2021-01-03 ENCOUNTER — Other Ambulatory Visit: Payer: Self-pay | Admitting: Family Medicine

## 2021-01-08 ENCOUNTER — Ambulatory Visit: Payer: PPO | Admitting: Family Medicine

## 2021-01-09 ENCOUNTER — Ambulatory Visit: Payer: PPO | Admitting: Family Medicine

## 2021-01-11 ENCOUNTER — Ambulatory Visit: Payer: PPO | Admitting: Family Medicine

## 2021-01-12 ENCOUNTER — Encounter: Payer: Self-pay | Admitting: Family Medicine

## 2021-01-12 ENCOUNTER — Ambulatory Visit (INDEPENDENT_AMBULATORY_CARE_PROVIDER_SITE_OTHER): Payer: PPO | Admitting: Family Medicine

## 2021-01-12 ENCOUNTER — Other Ambulatory Visit: Payer: Self-pay

## 2021-01-12 VITALS — BP 144/72 | HR 82 | Resp 16 | Ht 62.0 in | Wt 188.0 lb

## 2021-01-12 DIAGNOSIS — Z1211 Encounter for screening for malignant neoplasm of colon: Secondary | ICD-10-CM

## 2021-01-12 DIAGNOSIS — I10 Essential (primary) hypertension: Secondary | ICD-10-CM

## 2021-01-12 DIAGNOSIS — E669 Obesity, unspecified: Secondary | ICD-10-CM | POA: Diagnosis not present

## 2021-01-12 DIAGNOSIS — M25512 Pain in left shoulder: Secondary | ICD-10-CM

## 2021-01-12 DIAGNOSIS — G8929 Other chronic pain: Secondary | ICD-10-CM | POA: Diagnosis not present

## 2021-01-12 DIAGNOSIS — E785 Hyperlipidemia, unspecified: Secondary | ICD-10-CM | POA: Diagnosis not present

## 2021-01-12 MED ORDER — AMLODIPINE BESYLATE 2.5 MG PO TABS: 2.5000 mg | ORAL_TABLET | Freq: Every day | ORAL | 3 refills | Status: DC

## 2021-01-12 NOTE — Assessment & Plan Note (Signed)
  Patient re-educated about  the importance of commitment to a  minimum of 150 minutes of exercise per week as able.  The importance of healthy food choices with portion control discussed, as well as eating regularly and within a 12 hour window most days. The need to choose "clean , green" food 50 to 75% of the time is discussed, as well as to make water the primary drink and set a goal of 64 ounces water daily.    Weight /BMI 01/12/2021 08/15/2020 07/12/2020  WEIGHT 188 lb 186 lb 185 lb  HEIGHT 5\' 2"  5\' 2"  5\' 2"   BMI 34.39 kg/m2 34.02 kg/m2 33.84 kg/m2

## 2021-01-12 NOTE — Assessment & Plan Note (Signed)
Hyperlipidemia:Low fat diet discussed and encouraged.   Lipid Panel  Lab Results  Component Value Date   CHOL 226 (H) 06/15/2020   HDL 53 06/15/2020   LDLCALC 146 (H) 06/15/2020   TRIG 148 06/15/2020   CHOLHDL 4.3 06/15/2020     Updated lab needed at/ before next visit.; 

## 2021-01-12 NOTE — Assessment & Plan Note (Signed)
`  chronic x 10 mionths, deformity/ swelling and localized tenderness just distal to head of humerus, has  Had PT , no pain relief, needs MRI

## 2021-01-12 NOTE — Progress Notes (Signed)
Yvonne Brewer     MRN: [id number]      DOB: 10-Aug-1946   HPI [name] is here for follow up and re-evaluation of chronic medical conditions, medication management and review of any available recent lab and radiology data.  Preventive health is updated, specifically  Cancer screening and Immunization.    Has had PT for left shoulder pain which started 03/2020, still hs tender localized pain just inferior to humeral head, has also had intra articular injection with no benefit. Water aerobics 4 times weekly Reports too much sugar intake as a barrier to weight loss  ROS Denies recent fever or chills. Denies sinus pressure, nasal congestion, ear pain or sore throat. Denies chest congestion, productive cough or wheezing. Denies chest pains, palpitations and leg swelling Denies abdominal pain, nausea, vomiting,diarrhea or constipation.   Denies dysuria, frequency, hesitancy or incontinence.  Denies depression, anxiety or insomnia. Denies skin break down or rash.   PE  BP (!) 144/72   Pulse 82   Resp 16   Ht 5\' 2"  (1.575 m)   Wt 188 lb (85.3 kg)   SpO2 95%   BMI 34.39 kg/m   Patient alert and oriented and in no cardiopulmonary distress.  HEENT: No facial asymmetry, EOMI,     Neck supple .  Chest: Clear to auscultation bilaterally.  CVS: S1, S2 no murmurs, no S3.Regular rate.  ABD: Soft non tender.   Ext: No edema  MS: Adequate ROM spine, hips and knees.Tender swelling  Distal to humeral head  Skin: Intact, no ulcerations or rash noted.  Psych: Good eye contact, normal affect. Memory intact not anxious or depressed appearing.  CNS: CN 2-12 intact, power,  normal throughout.no focal deficits noted.   Assessment & Plan  Left shoulder pain `chronic x 10 mionths, deformity/ swelling and localized tenderness just distal to head of humerus, has  Had PT , no pain relief, needs MRI  Essential hypertension Uncontrolled, add amlodipine 2.5 mg daily DASH diet and  commitment to daily physical activity for a minimum of 30 minutes discussed and encouraged, as a part of hypertension management. The importance of attaining a healthy weight is also discussed.  BP/Weight 01/12/2021 08/15/2020 07/12/2020 07/10/2020 04/25/2020 08/12/2019 45/80/9983  Systolic BP 382 505 397 673 419 379 024  Diastolic BP 72 81 90 81 85 80 80  Wt. (Lbs) 188 186 185 186 185 181 181  BMI 34.39 34.02 33.84 34.02 33.84 32.06 32.06       Obesity (BMI 30.0-34.9)  Patient re-educated about  the importance of commitment to a  minimum of 150 minutes of exercise per week as able.  The importance of healthy food choices with portion control discussed, as well as eating regularly and within a 12 hour window most days. The need to choose "clean , green" food 50 to 75% of the time is discussed, as well as to make water the primary drink and set a goal of 64 ounces water daily.    Weight /BMI 01/12/2021 08/15/2020 07/12/2020  WEIGHT 188 lb 186 lb 185 lb  HEIGHT 5\' 2"  5\' 2"  5\' 2"   BMI 34.39 kg/m2 34.02 kg/m2 33.84 kg/m2      Hyperlipidemia LDL goal <100 Hyperlipidemia:Low fat diet discussed and encouraged.   Lipid Panel  Lab Results  Component Value Date   CHOL 226 (H) 06/15/2020   HDL 53 06/15/2020   LDLCALC 146 (H) 06/15/2020   TRIG 148 06/15/2020   CHOLHDL 4.3 06/15/2020   Updated lab  needed at/ before next visit.

## 2021-01-12 NOTE — Patient Instructions (Addendum)
Annual exam and bP re evaluation in 6 to 8 weeks, call if you need me sooner  New additional dose of amlodipine 2.5 mg , take along with the 5 mg tablet every day , total of 7.5 mg daly  You are referred for an mRI  tjhis is a good year for cologuard test, the company will discuss cost before you send off the specime  Fasting lipid, cmp and EGFR today if fasting, if not then in the next  1 week It is important that you exercise regularly at least 30 minutes 5 times a week. If you develop chest pain, have severe difficulty breathing, or feel very tired, stop exercising immediately and seek medical attention   Think about what you will eat, plan ahead. Choose " clean, green, fresh or frozen" over canned, processed or packaged foods which are more sugary, salty and fatty. 70 to 75% of food eaten should be vegetables and fruit. Three meals at set times with snacks allowed between meals, but they must be fruit or vegetables. Aim to eat over a 12 hour period , example 7 am to 7 pm, and STOP after  your last meal of the day. Drink water,generally about 64 ounces per day, no other drink is as healthy. Fruit juice is best enjoyed in a healthy way, by EATING the fruit.  

## 2021-01-12 NOTE — Assessment & Plan Note (Signed)
Uncontrolled, add amlodipine 2.5 mg daily DASH diet and commitment to daily physical activity for a minimum of 30 minutes discussed and encouraged, as a part of hypertension management. The importance of attaining a healthy weight is also discussed.  BP/Weight 01/12/2021 08/15/2020 07/12/2020 07/10/2020 04/25/2020 08/12/2019 19/50/9326  Systolic BP 712 458 099 833 825 053 976  Diastolic BP 72 81 90 81 85 80 80  Wt. (Lbs) 188 186 185 186 185 181 181  BMI 34.39 34.02 33.84 34.02 33.84 32.06 32.06

## 2021-01-15 DIAGNOSIS — I10 Essential (primary) hypertension: Secondary | ICD-10-CM | POA: Diagnosis not present

## 2021-01-15 DIAGNOSIS — E785 Hyperlipidemia, unspecified: Secondary | ICD-10-CM | POA: Diagnosis not present

## 2021-01-16 LAB — CMP14+EGFR
ALT: 13 IU/L (ref 0–32)
AST: 16 IU/L (ref 0–40)
Albumin/Globulin Ratio: 1.9 (ref 1.2–2.2)
Albumin: 4.5 g/dL (ref 3.7–4.7)
Alkaline Phosphatase: 63 IU/L (ref 44–121)
BUN/Creatinine Ratio: 13 (ref 12–28)
BUN: 12 mg/dL (ref 8–27)
Bilirubin Total: 0.5 mg/dL (ref 0.0–1.2)
CO2: 25 mmol/L (ref 20–29)
Calcium: 9.5 mg/dL (ref 8.7–10.3)
Chloride: 99 mmol/L (ref 96–106)
Creatinine, Ser: 0.91 mg/dL (ref 0.57–1.00)
Globulin, Total: 2.4 g/dL (ref 1.5–4.5)
Glucose: 101 mg/dL — ABNORMAL HIGH (ref 65–99)
Potassium: 4.3 mmol/L (ref 3.5–5.2)
Sodium: 137 mmol/L (ref 134–144)
Total Protein: 6.9 g/dL (ref 6.0–8.5)
eGFR: 66 mL/min/{1.73_m2} (ref >59)

## 2021-01-16 LAB — LIPID PANEL
Chol/HDL Ratio: 4.5 ratio — ABNORMAL HIGH (ref 0.0–4.4)
Cholesterol, Total: 253 mg/dL — ABNORMAL HIGH (ref 100–199)
HDL: 56 mg/dL (ref >39)
LDL Chol Calc (NIH): 164 mg/dL — ABNORMAL HIGH (ref 0–99)
Triglycerides: 182 mg/dL — ABNORMAL HIGH (ref 0–149)
VLDL Cholesterol Cal: 33 mg/dL (ref 5–40)

## 2021-01-18 ENCOUNTER — Other Ambulatory Visit: Payer: Self-pay

## 2021-01-18 MED ORDER — ROSUVASTATIN CALCIUM 10 MG PO TABS: 10.0000 mg | ORAL_TABLET | Freq: Every day | ORAL | 1 refills | Status: DC

## 2021-01-18 NOTE — Progress Notes (Signed)
Called pt no answer. Sent results via mychart and sent med to pharmacy

## 2021-01-23 ENCOUNTER — Telehealth: Payer: Self-pay

## 2021-01-23 NOTE — Telephone Encounter (Signed)
I thought we only looked to the primary to determine if an authorization is needed or not

## 2021-01-23 NOTE — Telephone Encounter (Signed)
I'm not sure.  

## 2021-01-23 NOTE — Telephone Encounter (Signed)
Melissa called from [place] services need prior authorization for MRI for the [facility]. [employer] does not require, but [facility] does. Melissa call back # [phone] ext. [place]

## 2021-01-25 ENCOUNTER — Ambulatory Visit (HOSPITAL_COMMUNITY): Payer: PPO

## 2021-01-26 NOTE — Telephone Encounter (Signed)
Auth number [id number]

## 2021-01-31 DIAGNOSIS — Z1211 Encounter for screening for malignant neoplasm of colon: Secondary | ICD-10-CM | POA: Diagnosis not present

## 2021-02-06 ENCOUNTER — Ambulatory Visit (HOSPITAL_COMMUNITY)
Admission: RE | Admit: 2021-02-06 | Discharge: 2021-02-06 | Disposition: A | Discharge status: Home / Self Care | Payer: PPO | Source: Ambulatory Visit | Attending: Family Medicine | Admitting: Family Medicine

## 2021-02-06 DIAGNOSIS — M25512 Pain in left shoulder: Secondary | ICD-10-CM | POA: Insufficient documentation

## 2021-02-06 DIAGNOSIS — G8929 Other chronic pain: Secondary | ICD-10-CM | POA: Diagnosis not present

## 2021-02-06 DIAGNOSIS — M75122 Complete rotator cuff tear or rupture of left shoulder, not specified as traumatic: Secondary | ICD-10-CM | POA: Diagnosis not present

## 2021-02-06 DIAGNOSIS — M19012 Primary osteoarthritis, left shoulder: Secondary | ICD-10-CM | POA: Diagnosis not present

## 2021-02-12 LAB — COLOGUARD: Cologuard: NEGATIVE

## 2021-02-28 ENCOUNTER — Encounter: Payer: PPO | Admitting: Family Medicine

## 2021-03-05 DIAGNOSIS — M75122 Complete rotator cuff tear or rupture of left shoulder, not specified as traumatic: Secondary | ICD-10-CM | POA: Diagnosis not present

## 2021-03-14 ENCOUNTER — Encounter: Payer: Self-pay | Admitting: Family Medicine

## 2021-03-14 ENCOUNTER — Other Ambulatory Visit: Payer: Self-pay

## 2021-03-14 ENCOUNTER — Ambulatory Visit (INDEPENDENT_AMBULATORY_CARE_PROVIDER_SITE_OTHER): Payer: PPO | Admitting: Family Medicine

## 2021-03-14 VITALS — BP 118/74 | HR 80 | Temp 98.3°F | Resp 18 | Ht 62.0 in | Wt 188.0 lb

## 2021-03-14 DIAGNOSIS — G8929 Other chronic pain: Secondary | ICD-10-CM

## 2021-03-14 DIAGNOSIS — E559 Vitamin D deficiency, unspecified: Secondary | ICD-10-CM | POA: Diagnosis not present

## 2021-03-14 DIAGNOSIS — E669 Obesity, unspecified: Secondary | ICD-10-CM

## 2021-03-14 DIAGNOSIS — I10 Essential (primary) hypertension: Secondary | ICD-10-CM | POA: Diagnosis not present

## 2021-03-14 DIAGNOSIS — R12 Heartburn: Secondary | ICD-10-CM | POA: Diagnosis not present

## 2021-03-14 DIAGNOSIS — R7301 Impaired fasting glucose: Secondary | ICD-10-CM

## 2021-03-14 DIAGNOSIS — M25512 Pain in left shoulder: Secondary | ICD-10-CM

## 2021-03-14 DIAGNOSIS — E785 Hyperlipidemia, unspecified: Secondary | ICD-10-CM

## 2021-03-14 DIAGNOSIS — I83893 Varicose veins of bilateral lower extremities with other complications: Secondary | ICD-10-CM | POA: Diagnosis not present

## 2021-03-14 DIAGNOSIS — M7989 Other specified soft tissue disorders: Secondary | ICD-10-CM | POA: Diagnosis not present

## 2021-03-14 NOTE — Patient Instructions (Addendum)
Annual exam end November, call if you need me sooner  Please STOP ALL caffeine, tea, chocolate, coca, coffee, caffeinated sodas. After 2 weeks of you still ave heartburn you will need to start specific medications to deal with this, please leave a note as to how your heartburn is after 2 weeks     We will send script for bilateral knee high compression hose , 12 to   19 mm HG  for leg swelling and varicose veins   Fasting lipid, cmp and EGFR , HBA1C, TSH , Vit Dand CBC 5 to 7 days before next visit  Covid  booster is overdue, please get this  It is important that you exercise regularly at least 30 minutes 5 times a week. If you develop chest pain, have severe difficulty breathing, or feel very tired, stop exercising immediately and seek medical attention   Thanks for choosing [facility], we consider it a privelige to serve you.

## 2021-03-18 ENCOUNTER — Encounter: Payer: Self-pay | Admitting: Family Medicine

## 2021-03-18 DIAGNOSIS — I83899 Varicose veins of unspecified lower extremities with other complications: Secondary | ICD-10-CM | POA: Insufficient documentation

## 2021-03-18 NOTE — Assessment & Plan Note (Signed)
Controlled, no change in medication DASH diet and commitment to daily physical activity for a minimum of 30 minutes discussed and encouraged, as a part of hypertension management. The importance of attaining a healthy weight is also discussed.  BP/Weight 03/14/2021 01/12/2021 08/15/2020 07/12/2020 07/10/2020 04/25/2020 [id number]  Systolic BP 123456 123456 [id number] 123456 [id number]  Diastolic BP 74 72 81 90 81 85 80  Wt. (Lbs) 188 188 186 185 186 185 181  BMI 34.39 34.39 34.02 33.84 34.02 33.84 32.06

## 2021-03-18 NOTE — Assessment & Plan Note (Signed)
Ongoing , stop caffeine, if continues wil start PPI

## 2021-03-18 NOTE — Progress Notes (Signed)
Yvonne Brewer     MRN: [id number]      DOB: May 16, 1946   HPI [name] is here for follow up and re-evaluation of chronic medical conditions, medication management and review of any available recent lab and radiology data.  Preventive health is updated, specifically  Cancer screening and Immunization.   Had orthopedic eval re left shoulder, and is holding off on surgery unless it worsens, complicated/ long recovery course The PT denies any adverse reactions to current medications since the last visit.  C/o heartburn , having to pepcid daily, denies dysphagia, still drinking caffeine, plans to stop completely and see if she needs medication, no interest in GI eval at this time C/o leg swelling which worsens during the day, and is reduced overnight, denies PND or orthopnea   ROS Denies recent fever or chills. Denies sinus pressure, nasal congestion, ear pain or sore throat. Denies chest congestion, productive cough or wheezing. Denies chest pains, palpitations  Denies , nausea, vomiting,diarrhea or constipation.   Denies dysuria, frequency, hesitancy or incontinence. C/o joint pain, swelling and limitation in mobility. Denies headaches, seizures, numbness, or tingling. Denies depression, anxiety or insomnia. Denies skin break down or rash.   PE  BP 118/74 (BP Location: Right Arm, Patient Position: Sitting, Cuff Size: Large)   Pulse 80   Temp 98.3 F (36.8 C)   Resp 18   Ht '5\' 2"'$  (1.575 m)   Wt 188 lb (85.3 kg)   SpO2 96%   BMI 34.39 kg/m   Patient alert and oriented and in no cardiopulmonary distress.  HEENT: No facial asymmetry, EOMI,     Neck supple .  Chest: Clear to auscultation bilaterally.  CVS: S1, S2 no murmurs, no S3.Regular rate.Bilateral varicose veins, right greater than left  ABD: Soft non tender.   Ext: one plus  edema bilaterally, right greater than left  MS: decreased ROM spine, left shoulder, hips and knees.  Skin: Intact, no ulcerations or  rash noted.  Psych: Good eye contact, normal affect. Memory intact not anxious or depressed appearing.  CNS: CN 2-12 intact, power,  normal throughout.no focal deficits noted.   Assessment & Plan  Essential hypertension Controlled, no change in medication DASH diet and commitment to daily physical activity for a minimum of 30 minutes discussed and encouraged, as a part of hypertension management. The importance of attaining a healthy weight is also discussed.  BP/Weight 03/14/2021 01/12/2021 08/15/2020 07/12/2020 07/10/2020 04/25/2020 [id number]  Systolic BP 123456 123456 [id number] 123456 [id number]  Diastolic BP 74 72 81 90 81 85 80  Wt. (Lbs) 188 188 186 185 186 185 181  BMI 34.39 34.39 34.02 33.84 34.02 33.84 32.06       Left shoulder pain Unchanged, but has had Ortho eval with which she is satisfied  Heartburn Ongoing , stop caffeine, if continues wil start PPI  Hyperlipidemia LDL goal <100 Hyperlipidemia:Low fat diet discussed and encouraged.   Lipid Panel  Lab Results  Component Value Date   CHOL 253 (H) 01/15/2021   HDL 56 01/15/2021   LDLCALC 164 (H) 01/15/2021   TRIG 182 (H) 01/15/2021   CHOLHDL 4.5 (H) 01/15/2021   Needs to reduce fried and fatty foods    Obesity (BMI 30.0-34.9)  Patient re-educated about  the importance of commitment to a  minimum of 150 minutes of exercise per week as able.  The importance of healthy food choices with portion control discussed, as well as eating regularly and within a 12  hour window most days. The need to choose "clean , green" food 50 to 75% of the time is discussed, as well as to make water the primary drink and set a goal of 64 ounces water daily.    Weight /BMI 03/14/2021 01/12/2021 08/15/2020  WEIGHT 188 lb 188 lb 186 lb  HEIGHT '5\' 2"'$  '5\' 2"'$  '5\' 2"'$   BMI 34.39 kg/m2 34.39 kg/m2 34.02 kg/m2      Varicose veins of leg with swelling Bilateral compression hose prescribed

## 2021-03-18 NOTE — Assessment & Plan Note (Signed)
Hyperlipidemia:Low fat diet discussed and encouraged.   Lipid Panel  Lab Results  Component Value Date   CHOL 253 (H) 01/15/2021   HDL 56 01/15/2021   LDLCALC 164 (H) 01/15/2021   TRIG 182 (H) 01/15/2021   CHOLHDL 4.5 (H) 01/15/2021   Needs to reduce fried and fatty foods

## 2021-03-18 NOTE — Assessment & Plan Note (Signed)
Unchanged, but has had Ortho eval with which she is satisfied

## 2021-03-18 NOTE — Assessment & Plan Note (Signed)
  Patient re-educated about  the importance of commitment to a  minimum of 150 minutes of exercise per week as able.  The importance of healthy food choices with portion control discussed, as well as eating regularly and within a 12 hour window most days. The need to choose "clean , green" food 50 to 75% of the time is discussed, as well as to make water the primary drink and set a goal of 64 ounces water daily.    Weight /BMI 03/14/2021 01/12/2021 08/15/2020  WEIGHT 188 lb 188 lb 186 lb  HEIGHT '5\' 2"'$  '5\' 2"'$  '5\' 2"'$   BMI 34.39 kg/m2 34.39 kg/m2 34.02 kg/m2

## 2021-03-18 NOTE — Assessment & Plan Note (Signed)
Bilateral compression hose prescribed

## 2021-03-27 ENCOUNTER — Encounter: Payer: Self-pay | Admitting: Family Medicine

## 2021-04-10 ENCOUNTER — Other Ambulatory Visit: Payer: Self-pay | Admitting: Family Medicine

## 2021-04-26 ENCOUNTER — Telehealth: Payer: Self-pay | Admitting: *Deleted

## 2021-04-26 NOTE — Chronic Care Management (AMB) (Signed)
  Chronic Care Management   Outreach Note  04/26/2021 Name: [name] MRN: [id number] DOB: 1946-07-12  Yvonne Brewer is a 75 y.o. year old female who is a primary care patient of Fayrene Helper, MD. I reached out to Yvonne Brewer by phone today in response to a referral sent by Ms. Wayne Sever Martello's PCP, Dr. Moshe Cipro.       An unsuccessful telephone outreach was attempted today. The patient was referred to the case management team for assistance with care management and care coordination.   Follow Up Plan: A HIPAA compliant phone message was left for the patient providing contact information and requesting a return call. The care management team will reach out to the patient again over the next 7 days. If patient returns call to provider office, please advise to call [facility] at [phone].  [facility]  Direct Dial: [phone]

## 2021-05-01 NOTE — Chronic Care Management (AMB) (Signed)
  Chronic Care Management   Note  05/01/2021 Name: Yvonne Brewer MRN: [id number] DOB: 04-28-1946  Yvonne Brewer is a 75 y.o. year old female who is a primary care patient of Fayrene Helper, MD. I reached out to Yvonne Brewer by phone today in response to a referral sent by Ms. Wayne Sever Vallas's PCP, Dr. Moshe Cipro.      Yvonne Brewer was given information about Chronic Care Management services today including:  CCM service includes personalized support from designated clinical staff supervised by her physician, including individualized plan of care and coordination with other care providers 24/7 contact phone numbers for assistance for urgent and routine care needs. Service will only be billed when office clinical staff spend 20 minutes or more in a month to coordinate care. Only one practitioner may furnish and bill the service in a calendar month. The patient may stop CCM services at any time (effective at the end of the month) by phone call to the office staff. The patient will be responsible for cost sharing (co-pay) of up to 20% of the service fee (after annual deductible is met).  Patient agreed to services and verbal consent obtained.   Follow up plan: Telephone appointment with care management team member scheduled for:05/08/21  [facility]  Direct Dial: [phone]

## 2021-05-08 ENCOUNTER — Ambulatory Visit (INDEPENDENT_AMBULATORY_CARE_PROVIDER_SITE_OTHER): Payer: PPO | Admitting: *Deleted

## 2021-05-08 DIAGNOSIS — E785 Hyperlipidemia, unspecified: Secondary | ICD-10-CM

## 2021-05-08 DIAGNOSIS — I10 Essential (primary) hypertension: Secondary | ICD-10-CM

## 2021-05-08 DIAGNOSIS — Z9109 Other allergy status, other than to drugs and biological substances: Secondary | ICD-10-CM

## 2021-05-08 NOTE — Patient Instructions (Signed)
Visit Information   PATIENT GOALS:   Goals Addressed             This Visit's Progress    Hyperlipidemia management       Timeframe:  Long-Range Goal Priority:  Medium Start Date:            05/08/2021                 Expected End Date:    11/05/2021                   Follow Up Date 07/26/2021   - complete a health care power of attorney and living will - do one enjoyable thing every day - make shared treatment decisions with doctor - avoid/ limit trans and saturated fat in your diet - bake or broil meat instead of frying - take all medications as prescribed - continue water aerobics- keep up the good work - Economist if any questions or concerns at [phone]     Why is this important?   Having a long-term illness can be scary.  It can also be stressful for you and your caregiver.  These steps may help.    Notes:      Track and Manage My Blood Pressure-Hypertension       Timeframe:  Long-Range Goal Priority:  Medium Start Date:         05/08/2021                    Expected End Date:      11/05/2021                 Follow Up Date 07/26/2021   - check blood pressure 3 times per week - choose a place to take my blood pressure (home, clinic or office, retail store) - write blood pressure results in a log or diary  - follow low sodium diet - look over education sent via My Chart- low sodium diet   Why is this important?   You won't feel high blood pressure, but it can still hurt your blood vessels.  High blood pressure can cause heart or kidney problems. It can also cause a stroke.  Making lifestyle changes like losing a little weight or eating less salt will help.  Checking your blood pressure at home and at different times of the day can help to control blood pressure.  If the doctor prescribes medicine remember to take it the way the doctor ordered.  Call the office if you cannot afford the medicine or if there are questions about it.     Notes:          Consent to CCM Services: Yvonne Brewer was given information about Chronic Care Management services including:  CCM service includes personalized support from designated clinical staff supervised by her physician, including individualized plan of care and coordination with other care providers 24/7 contact phone numbers for assistance for urgent and routine care needs. Service will only be billed when office clinical staff spend 20 minutes or more in a month to coordinate care. Only one practitioner may furnish and bill the service in a calendar month. The patient may stop CCM services at any time (effective at the end of the month) by phone call to the office staff. The patient will be responsible for cost sharing (co-pay) of up to 20% of the service fee (after annual deductible is met).  Patient agreed to services  and verbal consent obtained.   Patient verbalizes understanding of instructions provided today and agrees to view in [place].   Telephone follow up appointment with care management team member scheduled for:  12/1/2022Low-Sodium Eating Plan Sodium, which is an element that makes up salt, helps you maintain a healthy balance of fluids in your body. Too much sodium can increase your blood pressure and cause fluid and waste to be held in your body. Your health care provider or dietitian may recommend following this plan if you have high blood pressure (hypertension), kidney disease, liver disease, or heart failure. Eating less sodium can help lower your blood pressure, reduce swelling, and protect your heart, liver, and kidneys. What are tips for following this plan? Reading food labels The Nutrition Facts label lists the amount of sodium in one serving of the food. If you eat more than one serving, you must multiply the listed amount of sodium by the number of servings. Choose foods with less than 140 mg of sodium per serving. Avoid foods with 300 mg of sodium or more per  serving. Shopping  Look for lower-sodium products, often labeled as "low-sodium" or "no salt added." Always check the sodium content, even if foods are labeled as "unsalted" or "no salt added." Buy fresh foods. Avoid canned foods and pre-made or frozen meals. Avoid canned, cured, or processed meats. Buy breads that have less than 80 mg of sodium per slice. Cooking  Eat more home-cooked food and less restaurant, buffet, and fast food. Avoid adding salt when cooking. Use salt-free seasonings or herbs instead of table salt or sea salt. Check with your health care provider or pharmacist before using salt substitutes. Cook with plant-based oils, such as canola, sunflower, or olive oil. Meal planning When eating at a restaurant, ask that your food be prepared with less salt or no salt, if possible. Avoid dishes labeled as brined, pickled, cured, smoked, or made with soy sauce, miso, or teriyaki sauce. Avoid foods that contain MSG (monosodium glutamate). MSG is sometimes added to [place] food, bouillon, and some canned foods. Make meals that can be grilled, baked, poached, roasted, or steamed. These are generally made with less sodium. General information Most people on this plan should limit their sodium intake to 1,500-2,000 mg (milligrams) of sodium each day. What foods should I eat? Fruits Fresh, frozen, or canned fruit. Fruit juice. Vegetables Fresh or frozen vegetables. "No salt added" canned vegetables. "No salt added" tomato sauce and paste. Low-sodium or reduced-sodium tomato and vegetable juice. Grains Low-sodium cereals, including oats, puffed wheat and rice, and shredded wheat. Low-sodium crackers. Unsalted rice. Unsalted pasta. Low-sodium bread. Whole-grain breads and whole-grain pasta. Meats and other proteins Fresh or frozen (no salt added) meat, poultry, seafood, and fish. Low-sodium canned tuna and salmon. Unsalted nuts. Dried peas, beans, and lentils without added salt.  Unsalted canned beans. Eggs. Unsalted nut butters. Dairy Milk. Soy milk. Cheese that is naturally low in sodium, such as ricotta cheese, fresh mozzarella, or Swiss cheese. Low-sodium or reduced-sodium cheese. Cream cheese. Yogurt. Seasonings and condiments Fresh and dried herbs and spices. Salt-free seasonings. Low-sodium mustard and ketchup. Sodium-free salad dressing. Sodium-free light mayonnaise. Fresh or refrigerated horseradish. Lemon juice. Vinegar. Other foods Homemade, reduced-sodium, or low-sodium soups. Unsalted popcorn and pretzels. Low-salt or salt-free chips. The items listed above may not be a complete list of foods and beverages you can eat. Contact a dietitian for more information. What foods should I avoid? Vegetables Sauerkraut, pickled vegetables, and relishes. Olives. [place] fries.  Onion rings. Regular canned vegetables (not low-sodium or reduced-sodium). Regular canned tomato sauce and paste (not low-sodium or reduced-sodium). Regular tomato and vegetable juice (not low-sodium or reduced-sodium). Frozen vegetables in sauces. Grains Instant hot cereals. Bread stuffing, pancake, and biscuit mixes. Croutons. Seasoned rice or pasta mixes. Noodle soup cups. Boxed or frozen macaroni and cheese. Regular salted crackers. Self-rising flour. Meats and other proteins Meat or fish that is salted, canned, smoked, spiced, or pickled. Precooked or cured meat, such as sausages or meat loaves. Berniece Salines. Ham. Pepperoni. Hot dogs. Corned beef. Chipped beef. Salt pork. Jerky. Pickled herring. Anchovies and sardines. Regular canned tuna. Salted nuts. Dairy Processed cheese and cheese spreads. Hard cheeses. Cheese curds. Blue cheese. Feta cheese. String cheese. Regular cottage cheese. Buttermilk. Canned milk. Fats and oils Salted butter. Regular margarine. Ghee. Bacon fat. Seasonings and condiments Onion salt, garlic salt, seasoned salt, table salt, and sea salt. Canned and packaged gravies.  Worcestershire sauce. Tartar sauce. Barbecue sauce. Teriyaki sauce. Soy sauce, including reduced-sodium. Steak sauce. Fish sauce. Oyster sauce. Cocktail sauce. Horseradish that you find on the shelf. Regular ketchup and mustard. Meat flavorings and tenderizers. Bouillon cubes. Hot sauce. Pre-made or packaged marinades. Pre-made or packaged taco seasonings. Relishes. Regular salad dressings. Salsa. Other foods Salted popcorn and pretzels. Corn chips and puffs. Potato and tortilla chips. Canned or dried soups. Pizza. Frozen entrees and pot pies. The items listed above may not be a complete list of foods and beverages you should avoid. Contact a dietitian for more information. Summary Eating less sodium can help lower your blood pressure, reduce swelling, and protect your heart, liver, and kidneys. Most people on this plan should limit their sodium intake to 1,500-2,000 mg (milligrams) of sodium each day. Canned, boxed, and frozen foods are high in sodium. Restaurant foods, fast foods, and pizza are also very high in sodium. You also get sodium by adding salt to food. Try to cook at home, eat more fresh fruits and vegetables, and eat less fast food and canned, processed, or prepared foods. This information is not intended to replace advice given to you by your health care provider. Make sure you discuss any questions you have with your health care provider. Document Revised: 09/17/2019 Document Reviewed: 07/14/2019 Elsevier Patient Education  2022 [place] [facility], BSN RN Case Manager [facility] [phone]   CLINICAL CARE PLAN: Patient Care Plan: Hyperlipidemia     Problem Identified: Health Promotion or Disease Self-Management (Hyperlipidemia)   Priority: Medium     Long-Range Goal: Self-Management Plan Developed for Hyperlipidemia   Start Date: 05/08/2021  Expected End Date: 11/05/2021  This Visit's Progress: On track  Priority: Medium  Note:   Current  Barriers:  Poorly controlled hyperlipidemia, complicated by diet Current antihyperlipidemic regimen: rosuvastatin Most recent lipid panel:     Component Value Date/Time   CHOL 253 (H) 01/15/2021 0747   TRIG 182 (H) 01/15/2021 0747   HDL 56 01/15/2021 0747   CHOLHDL 4.5 (H) 01/15/2021 0747   CHOLHDL 4.3 06/15/2020 0834   VLDL 26 04/08/2017 0727   LDLCALC 164 (H) 01/15/2021 0747   LDLCALC 146 (H) 06/15/2020 0834  Patient lives with her spouse, is independent in all aspects of her care, continues to drive, exercises daily (water aerobics), does not have HCPOA/ living will in place but reports she is working on this.  Has cane on hand but does not use.  Patient reports she has had some issues with heartburn and is trying to  do better with her diet and decrease carbohydrate intake. ASCVD risk enhancing conditions: age >57, HTN Does not adhere to provider recommendations re:  does not always adhere to heart healthy diet RN Care Manager Clinical Goal(s):  patient will work with Consulting civil engineer, providers, and care team towards execution of optimized self-health management plan patient will verbalize understanding of plan for hyperlipidemia the patient will demonstrate ongoing self health care management ability Interventions: Collaboration with Fayrene Helper, MD regarding development and update of comprehensive plan of care as evidenced by provider attestation and co-signature Inter-disciplinary care team collaboration (see longitudinal plan of care) Medication review performed; medication list updated in electronic medical record.  Inter-disciplinary care team collaboration (see longitudinal plan of care) Evaluation of current treatment plan related to hyperlipidemia and patient's adherence to plan as established by provider. Provided education to patient and/or caregiver about advanced directives (pt has documents and working on completing) Provided education to patient re: heart healthy  diet, importance of continuing to exercise Reviewed scheduled/upcoming provider appointments including: primary care provider 12/12 and annual wellness visit 12/27 Discussed plans with patient for ongoing care management follow up and provided patient with direct contact information for care management team Patient Goals/Self-Care Activities: - call for medicine refill 2 or 3 days before it runs out - keep a list of all the medicines I take; vitamins and herbals too - change to whole grain breads, cereal, pasta - drink 6 to 8 glasses of water each day - eat 3 to 5 servings of fruits and vegetables each day - fill half the plate with nonstarchy vegetables - limit fast food meals to no more than 1 per week - manage portion size - read food labels for fat, fiber, carbohydrates and portion size - complete a health care power of attorney and living will - do one enjoyable thing every day - make shared treatment decisions with doctor - avoid/ limit trans and saturated fat in your diet - bake or broil meat instead of frying - take all medications as prescribed - continue water aerobics- keep up the good work - Economist if any questions or concerns at [phone] Follow Up Plan: Telephone follow up appointment with care management team member scheduled for:  07/26/2021      Patient Care Plan: Hypertension (Adult)     Problem Identified: Hypertension (Hypertension)   Priority: Medium     Long-Range Goal: Hypertension Monitored   Start Date: 05/08/2021  Expected End Date: 11/05/2021  This Visit's Progress: On track  Priority: Medium  Note:   Objective:  Last practice recorded BP readings:  BP Readings from Last 3 Encounters:  03/14/21 118/74  01/12/21 (!) 144/72  08/15/20 136/81   Most recent eGFR/CrCl:  Lab Results  Component Value Date   EGFR 66 01/15/2021    No components found for: CRCL Current Barriers:  Knowledge Deficits related to basic understanding of  hypertension pathophysiology and self care management- patient does not check blood pressure, has a monitor, may check into getting another monitor, will also check into monitoring blood pressure when she goes to pharmacy.  Patient exercises daily, has all medications and taking as prescribed. Does not adhere to provider recommendations re:  usually eats regular diet, can benefit from reinforcement low sodium diet Case Manager Clinical Goal(s):  patient will verbalize understanding of plan for hypertension management patient will demonstrate improved adherence to prescribed treatment plan for hypertension as evidenced by taking all medications as prescribed, monitoring and  recording blood pressure as directed, adhering to low sodium/DASH diet Interventions:  Collaboration with Fayrene Helper, MD regarding development and update of comprehensive plan of care as evidenced by provider attestation and co-signature Inter-disciplinary care team collaboration (see longitudinal plan of care) Evaluation of current treatment plan related to hypertension self management and patient's adherence to plan as established by provider. Provided education to patient re: DASH diet, complications of uncontrolled blood pressure Reviewed medications with patient and discussed importance of compliance Discussed plans with patient for ongoing care management follow up and provided patient with direct contact information for care management team Advised patient, providing education and rationale, to monitor blood pressure 3 x week and record, calling PCP for findings outside established parameters.  Reviewed scheduled/upcoming provider appointments including:  primary care provider 12/12  annual wellness visit 12/27 Education sent via My Chart - low sodium diet Self-Care Activities:  Attends all scheduled provider appointments Calls provider office for new concerns, questions, or BP outside discussed parameters Checks  BP and records as discussed Follows a low sodium diet/DASH diet Patient Goals: - check blood pressure 3 times per week - choose a place to take my blood pressure (home, clinic or office, retail store) - write blood pressure results in a log or diary  - follow low sodium diet - look over education sent via My Chart- low sodium diet Follow Up Plan: Telephone follow up appointment with care management team member scheduled for:  07/26/2021

## 2021-05-08 NOTE — Chronic Care Management (AMB) (Signed)
Chronic Care Management   CCM RN Visit Note  05/08/2021 Name: Yvonne Brewer MRN: [id number] DOB: Mar 05, 1946  Subjective: Yvonne Brewer is a 75 y.o. year old female who is a primary care patient of Fayrene Helper, MD. The care management team was consulted for assistance with disease management and care coordination needs.    Engaged with patient by telephone for initial visit in response to provider referral for case management and/or care coordination services.   Consent to Services:  The patient was given the following information about Chronic Care Management services today, agreed to services, and gave verbal consent: 1. CCM service includes personalized support from designated clinical staff supervised by the primary care provider, including individualized plan of care and coordination with other care providers 2. 24/7 contact phone numbers for assistance for urgent and routine care needs. 3. Service will only be billed when office clinical staff spend 20 minutes or more in a month to coordinate care. 4. Only one practitioner may furnish and bill the service in a calendar month. 5.The patient may stop CCM services at any time (effective at the end of the month) by phone call to the office staff. 6. The patient will be responsible for cost sharing (co-pay) of up to 20% of the service fee (after annual deductible is met). Patient agreed to services and consent obtained.  Patient agreed to services and verbal consent obtained.   Assessment: Review of patient past medical history, allergies, medications, health status, including review of consultants reports, laboratory and other test data, was performed as part of comprehensive evaluation and provision of chronic care management services.   SDOH (Social Determinants of Health) assessments and interventions performed:  SDOH Interventions    Flowsheet Row Most Recent Value  SDOH Interventions   Food Insecurity Interventions  Intervention Not Indicated  Housing Interventions Intervention Not Indicated  Transportation Interventions Intervention Not Indicated        CCM Care Plan  Allergies  Allergen Reactions   Ace Inhibitors Cough    Cough   Penicillins Hives    Has patient had a PCN reaction causing immediate rash, facial/tongue/throat swelling, SOB or lightheadedness with hypotension: No Has patient had a PCN reaction causing severe rash involving mucus membranes or skin necrosis: No Has patient had a PCN reaction that required hospitalization No Has patient had a PCN reaction occurring within the last 10 years: No If all of the above answers are "NO", then may proceed with Cephalosporin use.    Sulfonamide Derivatives Hives    Outpatient Encounter Medications as of 05/08/2021  Medication Sig   amLODipine (NORVASC) 2.5 MG tablet TAKE 1 TABLET BY MOUTH EVERY DAY   amLODipine (NORVASC) 5 MG tablet TAKE 1 TABLET BY MOUTH EVERY DAY   rosuvastatin (CRESTOR) 10 MG tablet Take 1 tablet (10 mg total) by mouth daily.   calcium elemental as carbonate (BARIATRIC TUMS ULTRA) 400 MG chewable tablet Chew 1,000 mg by mouth 3 (three) times daily. (Patient not taking: Reported on 05/08/2021)   No facility-administered encounter medications on file as of 05/08/2021.    Patient Active Problem List   Diagnosis Date Noted   Varicose veins of leg with swelling 03/18/2021   Left shoulder pain 07/10/2020   Anxiety 07/10/2019   Heartburn 07/07/2019   Educated about COVID-19 virus infection 01/10/2019   Fracture, foot, left, sequela 02/09/2018   Dyspepsia 04/18/2017   Osteoarthritis of both hips 04/18/2017   History of colonic polyps 11/13/2016   Pseudophakia 11/12/2016  Urinary incontinence in female 07/29/2016   Environmental allergies 07/14/2016   Chronic radicular pain of lower back 03/13/2014   Reduced vision 03/13/2014   Essential hypertension 03/08/2014   Obesity (BMI 30.0-34.9) 02/19/2010   Vitamin D  deficiency 07/27/2009   Hyperlipidemia LDL goal <100 02/19/2008   Disorder of bladder 02/19/2008    Conditions to be addressed/monitored:HTN and HLD  Care Plan : Hyperlipidemia  Updates made by Kassie Mends, RN since 05/08/2021 12:00 AM     Problem: Health Promotion or Disease Self-Management (Hyperlipidemia)   Priority: Medium     Long-Range Goal: Self-Management Plan Developed for Hyperlipidemia   Start Date: 05/08/2021  Expected End Date: 11/05/2021  This Visit's Progress: On track  Priority: Medium  Note:   Current Barriers:  Poorly controlled hyperlipidemia, complicated by diet Current antihyperlipidemic regimen: rosuvastatin Most recent lipid panel:     Component Value Date/Time   CHOL 253 (H) 01/15/2021 0747   TRIG 182 (H) 01/15/2021 0747   HDL 56 01/15/2021 0747   CHOLHDL 4.5 (H) 01/15/2021 0747   CHOLHDL 4.3 06/15/2020 0834   VLDL 26 04/08/2017 0727   LDLCALC 164 (H) 01/15/2021 0747   LDLCALC 146 (H) 06/15/2020 0834  Patient lives with her spouse, is independent in all aspects of her care, continues to drive, exercises daily (water aerobics), does not have HCPOA/ living will in place but reports she is working on this.  Has cane on hand but does not use.  Patient reports she has had some issues with heartburn and is trying to do better with her diet and decrease carbohydrate intake. ASCVD risk enhancing conditions: age >58, HTN Does not adhere to provider recommendations re:  does not always adhere to heart healthy diet RN Care Manager Clinical Goal(s):  patient will work with Consulting civil engineer, providers, and care team towards execution of optimized self-health management plan patient will verbalize understanding of plan for hyperlipidemia the patient will demonstrate ongoing self health care management ability Interventions: Collaboration with Fayrene Helper, MD regarding development and update of comprehensive plan of care as evidenced by provider attestation  and co-signature Inter-disciplinary care team collaboration (see longitudinal plan of care) Medication review performed; medication list updated in electronic medical record.  Inter-disciplinary care team collaboration (see longitudinal plan of care) Evaluation of current treatment plan related to hyperlipidemia and patient's adherence to plan as established by provider. Provided education to patient and/or caregiver about advanced directives (pt has documents and working on completing) Provided education to patient re: heart healthy diet, importance of continuing to exercise Reviewed scheduled/upcoming provider appointments including: primary care provider 12/12 and annual wellness visit 12/27 Discussed plans with patient for ongoing care management follow up and provided patient with direct contact information for care management team Patient Goals/Self-Care Activities: - call for medicine refill 2 or 3 days before it runs out - keep a list of all the medicines I take; vitamins and herbals too - change to whole grain breads, cereal, pasta - drink 6 to 8 glasses of water each day - eat 3 to 5 servings of fruits and vegetables each day - fill half the plate with nonstarchy vegetables - limit fast food meals to no more than 1 per week - manage portion size - read food labels for fat, fiber, carbohydrates and portion size - complete a health care power of attorney and living will - do one enjoyable thing every day - make shared treatment decisions with doctor - avoid/ limit trans and saturated  fat in your diet - bake or broil meat instead of frying - take all medications as prescribed - continue water aerobics- keep up the good work - Economist if any questions or concerns at [phone] Follow Up Plan: Telephone follow up appointment with care management team member scheduled for:  07/26/2021      Care Plan : Hypertension (Adult)  Updates made by Kassie Mends, RN since  05/08/2021 12:00 AM     Problem: Hypertension (Hypertension)   Priority: Medium     Long-Range Goal: Hypertension Monitored   Start Date: 05/08/2021  Expected End Date: 11/05/2021  This Visit's Progress: On track  Priority: Medium  Note:   Objective:  Last practice recorded BP readings:  BP Readings from Last 3 Encounters:  03/14/21 118/74  01/12/21 (!) 144/72  08/15/20 136/81   Most recent eGFR/CrCl:  Lab Results  Component Value Date   EGFR 66 01/15/2021    No components found for: CRCL Current Barriers:  Knowledge Deficits related to basic understanding of hypertension pathophysiology and self care management- patient does not check blood pressure, has a monitor, may check into getting another monitor, will also check into monitoring blood pressure when she goes to pharmacy.  Patient exercises daily, has all medications and taking as prescribed. Does not adhere to provider recommendations re:  usually eats regular diet, can benefit from reinforcement low sodium diet Case Manager Clinical Goal(s):  patient will verbalize understanding of plan for hypertension management patient will demonstrate improved adherence to prescribed treatment plan for hypertension as evidenced by taking all medications as prescribed, monitoring and recording blood pressure as directed, adhering to low sodium/DASH diet Interventions:  Collaboration with Fayrene Helper, MD regarding development and update of comprehensive plan of care as evidenced by provider attestation and co-signature Inter-disciplinary care team collaboration (see longitudinal plan of care) Evaluation of current treatment plan related to hypertension self management and patient's adherence to plan as established by provider. Provided education to patient re: DASH diet, complications of uncontrolled blood pressure Reviewed medications with patient and discussed importance of compliance Discussed plans with patient for ongoing care  management follow up and provided patient with direct contact information for care management team Advised patient, providing education and rationale, to monitor blood pressure 3 x week and record, calling PCP for findings outside established parameters.  Reviewed scheduled/upcoming provider appointments including:  primary care provider 12/12  annual wellness visit 12/27 Education sent via My Chart - low sodium diet Self-Care Activities:  Attends all scheduled provider appointments Calls provider office for new concerns, questions, or BP outside discussed parameters Checks BP and records as discussed Follows a low sodium diet/DASH diet Patient Goals: - check blood pressure 3 times per week - choose a place to take my blood pressure (home, clinic or office, retail store) - write blood pressure results in a log or diary  - follow low sodium diet - look over education sent via My Chart- low sodium diet Follow Up Plan: Telephone follow up appointment with care management team member scheduled for:  07/26/2021     Plan:Telephone follow up appointment with care management team member scheduled for:  07/26/2021  Jacqlyn Larsen [facility], BSN RN Case Manager [facility] [phone]

## 2021-05-25 DIAGNOSIS — I10 Essential (primary) hypertension: Secondary | ICD-10-CM | POA: Diagnosis not present

## 2021-05-25 DIAGNOSIS — E785 Hyperlipidemia, unspecified: Secondary | ICD-10-CM | POA: Diagnosis not present

## 2021-06-26 ENCOUNTER — Other Ambulatory Visit: Payer: Self-pay | Admitting: Family Medicine

## 2021-07-26 ENCOUNTER — Telehealth: Payer: Self-pay | Admitting: *Deleted

## 2021-07-26 ENCOUNTER — Telehealth: Payer: PPO

## 2021-07-26 NOTE — Telephone Encounter (Signed)
  Care Management   Follow Up Note   07/26/2021 Name: Yvonne Brewer MRN: [id number] DOB: 05-Dec-1945   Referred by: Fayrene Helper, MD Reason for referral : No chief complaint on file.   RN care manager spoke with patient for scheduled telephone outreach, pt reports she is on the way out the door and cannot talk at present, visit rescheduled for next week.  Follow Up Plan: Telephone follow up appointment with care management team member scheduled for:  12/6/222 at 67 am.  Jacqlyn Larsen [facility], BSN RN Case Manager [facility] [phone]

## 2021-07-31 ENCOUNTER — Telehealth: Payer: Self-pay | Admitting: *Deleted

## 2021-07-31 ENCOUNTER — Telehealth: Payer: PPO

## 2021-07-31 NOTE — Telephone Encounter (Signed)
  Care Management   Follow Up Note   07/31/2021 Name: Yvonne Brewer MRN: [id number] DOB: 07/27/1946   Referred by: Fayrene Helper, MD Reason for referral : Chronic Care Management (HTN, HLD)   A second unsuccessful telephone outreach was attempted today. The patient was referred to the case management team for assistance with care management and care coordination.   Follow Up Plan: Telephone follow up appointment with care management team member scheduled for:  upon care guide rescheduling   Jacqlyn Larsen [facility], BSN RN Case Manager [facility] [phone]

## 2021-08-01 ENCOUNTER — Other Ambulatory Visit: Payer: Self-pay

## 2021-08-01 ENCOUNTER — Ambulatory Visit (INDEPENDENT_AMBULATORY_CARE_PROVIDER_SITE_OTHER): Payer: PPO | Admitting: Internal Medicine

## 2021-08-01 ENCOUNTER — Ambulatory Visit (INDEPENDENT_AMBULATORY_CARE_PROVIDER_SITE_OTHER): Payer: PPO | Admitting: *Deleted

## 2021-08-01 ENCOUNTER — Encounter: Payer: Self-pay | Admitting: Internal Medicine

## 2021-08-01 DIAGNOSIS — J069 Acute upper respiratory infection, unspecified: Secondary | ICD-10-CM

## 2021-08-01 DIAGNOSIS — R059 Cough, unspecified: Secondary | ICD-10-CM | POA: Diagnosis not present

## 2021-08-01 LAB — POCT INFLUENZA A/B
Influenza A, POC: NEGATIVE
Influenza B, POC: NEGATIVE

## 2021-08-01 MED ORDER — AZITHROMYCIN 250 MG PO TABS: ORAL_TABLET | ORAL | 0 refills | Status: AC

## 2021-08-01 NOTE — Addendum Note (Signed)
Addended byIhor Dow on: 08/01/2021 11:49 AM   Modules accepted: Orders

## 2021-08-01 NOTE — Progress Notes (Signed)
Virtual Visit via Telephone Note   This visit type was conducted due to national recommendations for restrictions regarding the COVID-19 Pandemic (e.g. social distancing) in an effort to limit this patient's exposure and mitigate transmission in our community.  Due to her co-morbid illnesses, this patient is at least at moderate risk for complications without adequate follow up.  This format is felt to be most appropriate for this patient at this time.  The patient did not have access to video technology/had technical difficulties with video requiring transitioning to audio format only (telephone).  All issues noted in this document were discussed and addressed.  No physical exam could be performed with this format.  Evaluation Performed:  Follow-up visit  Date:  08/01/2021   ID:  Yvonne Brewer, Yvonne Brewer April 01, 1946, MRN [id number]  Patient Location: Home Provider Location: Office/Clinic  Participants: Patient Location of Patient: Home Location of Provider: Telehealth Consent was obtain for visit to be over via telehealth. I verified that I am speaking with the correct person using two identifiers.  PCP:  Fayrene Helper, MD   Chief Complaint: Sore throat, fever, cough and myalgias  History of Present Illness:    Yvonne Brewer is a 75 y.o. female who has a televisit for complaint of sore throat, fever, cough, myalgias and headache for last 2 days.  She denies any dyspnea or wheezing currently.  She has had negative COVID test at home x2.  Denies any recent sick contacts.  The patient does have symptoms concerning for COVID-19 infection (fever, chills, cough, or new shortness of breath).   Past Medical, Surgical, Social History, Allergies, and Medications have been Reviewed.  Past Medical History:  Diagnosis Date   Essential hypertension 2015   Female bladder prolapse    History of cystitis    History of skin cancer    Hyperlipidemia    Obesity    Past Surgical  History:  Procedure Laterality Date   CATARACT EXTRACTION  2012   bilateral    COLONOSCOPY  11/06/2011   Procedure: COLONOSCOPY;  Surgeon: Rogene Houston, MD;  [facility];  Service: Endoscopy;  Laterality: N/A;  7:30   COLONOSCOPY N/A 05/21/2017   Procedure: COLONOSCOPY;  Surgeon: Rogene Houston, MD;  Location: [facility];  Service: Endoscopy;  Laterality: N/A;  730-moved to 9/26 @10 :30am per [facility]   EYE SURGERY  2011   bilateral cataract with implants   HAMMER TOE SURGERY Left 01/2016   Left bunionectomy     Squamous cell ca rt ant chest 1989     YAG LASER APPLICATION Left 0/86/5784   Procedure: YAG LASER APPLICATION;  Surgeon: Rutherford Guys, MD;  Location: [facility];  Service: Ophthalmology;  Laterality: Left;   YAG LASER APPLICATION Right 6/96/2952   Procedure: YAG LASER APPLICATION;  Surgeon: Rutherford Guys, MD;  Location: [facility];  Service: Ophthalmology;  Laterality: Right;     Current Meds  Medication Sig   amLODipine (NORVASC) 2.5 MG tablet TAKE 1 TABLET BY MOUTH EVERY DAY   amLODipine (NORVASC) 5 MG tablet TAKE 1 TABLET BY MOUTH EVERY DAY   calcium elemental as carbonate (BARIATRIC TUMS ULTRA) 400 MG chewable tablet Chew 1,000 mg by mouth 3 (three) times daily.   rosuvastatin (CRESTOR) 10 MG tablet TAKE 1 TABLET BY MOUTH EVERY DAY     Allergies:   Ace inhibitors, Penicillins, and Sulfonamide derivatives   ROS:   Please see the history of present illness.  All other systems reviewed and are negative.   Labs/Other Tests and Data Reviewed:    Recent Labs: 01/15/2021: ALT 13; BUN 12; Creatinine, Ser 0.91; Potassium 4.3; Sodium 137   Recent Lipid Panel Lab Results  Component Value Date/Time   CHOL 253 (H) 01/15/2021 07:47 AM   TRIG 182 (H) 01/15/2021 07:47 AM   HDL 56 01/15/2021 07:47 AM   CHOLHDL 4.5 (H) 01/15/2021 07:47 AM   CHOLHDL 4.3 06/15/2020 08:34 AM   LDLCALC 164 (H) 01/15/2021 07:47 AM   LDLCALC 146 (H) 06/15/2020 08:34 AM    Wt Readings from  Last 3 Encounters:  03/14/21 188 lb (85.3 kg)  01/12/21 188 lb (85.3 kg)  08/15/20 186 lb (84.4 kg)     ASSESSMENT & PLAN:    URTI Flulike symptoms, check rapid flu Home COVID test negative Continue symptomatic treatment with Benadryl and Mucinex DM If persistent or worsening symptoms with negative flu test, will start antibiotic  Time:   Today, I have spent 9 minutes reviewing the chart, including problem list, medications, and with the patient with telehealth technology discussing the above problems.   Medication Adjustments/Labs and Tests Ordered: Current medicines are reviewed at length with the patient today.  Concerns regarding medicines are outlined above.   Tests Ordered: No orders of the defined types were placed in this encounter.   Medication Changes: No orders of the defined types were placed in this encounter.    Note: This dictation was prepared with Dragon dictation along with smaller phrase technology. Similar sounding words can be transcribed inadequately or may not be corrected upon review. Any transcriptional errors that result from this process are unintentional.      Disposition:  Follow up  Signed, Lindell Spar, MD  08/01/2021 11:06 AM     [place]

## 2021-08-02 ENCOUNTER — Telehealth: Payer: PPO | Admitting: Nurse Practitioner

## 2021-08-02 ENCOUNTER — Telehealth: Payer: Self-pay

## 2021-08-02 NOTE — Telephone Encounter (Signed)
Pt came by yesterday and had negative flu swab.  Feels worse today.  Please call with advise.

## 2021-08-02 NOTE — Telephone Encounter (Signed)
Spoke with pt she is taking antibiotic and she is feeling better this afternoon fever has broken and she is just feeling better all around this afternoon

## 2021-08-06 ENCOUNTER — Encounter: Payer: PPO | Admitting: Family Medicine

## 2021-08-08 ENCOUNTER — Other Ambulatory Visit: Payer: Self-pay

## 2021-08-08 ENCOUNTER — Ambulatory Visit (INDEPENDENT_AMBULATORY_CARE_PROVIDER_SITE_OTHER): Payer: PPO | Admitting: Family Medicine

## 2021-08-08 DIAGNOSIS — J069 Acute upper respiratory infection, unspecified: Secondary | ICD-10-CM

## 2021-08-08 NOTE — Progress Notes (Signed)
Virtual Visit via Telephone Note  I connected with TENLEIGH BYER on 08/08/21 at 11:40 AM EST by telephone and verified that I am speaking with the correct person using two identifiers.  Location: Patient: home Provider: office   I discussed the limitations, risks, security and privacy concerns of performing an evaluation and management service by telephone and the availability of in person appointments. I also discussed with the patient that there may be a patient responsible charge related to this service. The patient expressed understanding and agreed to proceed.   History of Present Illness: Approx 1 week h/o excess clear nasal drainage , less sore throat, 3 negative home covid test, and negative flu test States feels " bad", has excess cough Evaluated 1 week ago and has symptomatic meds Denies fever or chills    Observations/Objective:  There were no vitals taken for this visit. Good communication with no confusion and intact memory. Alert and oriented x 3 No signs of respiratory distress during speech  Assessment and Plan: URTI (infection of the upper respiratory tract) Symptoms  Improving based on report Continue symptomatic treatment already started Ensure adequate fluids and rest [place] lif deteriorates or go to UC   Follow Up Instructions:    I discussed the assessment and treatment plan with the patient. The patient was provided an opportunity to ask questions and all were answered. The patient agreed with the plan and demonstrated an understanding of the instructions.   The patient was advised to call back or seek an in-person evaluation if the symptoms worsen or if the condition fails to improve as anticipated.  I provided 9 minutes of non-face-to-face time during this encounter.   Tula Nakayama, MD

## 2021-08-08 NOTE — Patient Instructions (Signed)
Annual exam as before, call if you  need me sooner  No need for additional medication,. You are definitely on the mend, call if anything changes in a backward direction, which I do ot  anticipate  Used cough syrup as needed  Ensure 64 oz water , and amy do salt water gargles and honey and lime for sore throat  .Thanks for choosing [facility], we consider it a privelige to serve you.

## 2021-08-15 ENCOUNTER — Other Ambulatory Visit: Payer: Self-pay

## 2021-08-15 ENCOUNTER — Ambulatory Visit
Admission: EM | Admit: 2021-08-15 | Discharge: 2021-08-15 | Disposition: A | Discharge status: Home / Self Care | Payer: PPO | Attending: Family Medicine | Admitting: Family Medicine

## 2021-08-15 DIAGNOSIS — J209 Acute bronchitis, unspecified: Secondary | ICD-10-CM | POA: Diagnosis not present

## 2021-08-15 DIAGNOSIS — J3089 Other allergic rhinitis: Secondary | ICD-10-CM | POA: Diagnosis not present

## 2021-08-15 MED ORDER — FLUTICASONE PROPIONATE 50 MCG/ACT NA SUSP: 1.0000 | Freq: Two times a day (BID) | NASAL | 2 refills | Status: DC

## 2021-08-15 MED ORDER — CETIRIZINE HCL 10 MG PO TABS: 10.0000 mg | ORAL_TABLET | Freq: Every day | ORAL | 2 refills | Status: DC

## 2021-08-15 MED ORDER — PROMETHAZINE-DM 6.25-15 MG/5ML PO SYRP: 5.0000 mL | ORAL_SOLUTION | Freq: Four times a day (QID) | ORAL | 0 refills | Status: DC | PRN

## 2021-08-15 MED ORDER — DEXAMETHASONE SODIUM PHOSPHATE 10 MG/ML IJ SOLN
10.0000 mg | Freq: Once | INTRAMUSCULAR | Status: AC
  Administered 2021-08-15: 11:00:00 10 mg via INTRAMUSCULAR

## 2021-08-15 NOTE — ED Provider Notes (Signed)
[facility]    CSN: [id number] Arrival date & time: 08/15/21  1011      History   Chief Complaint Chief Complaint  Patient presents with   Cough    Cough and sore throat    HPI Yvonne Brewer is a 75 y.o. female.   Presenting today with several week history of nasal congestion, postnasal drip, cough, sore scratchy throat.  Denies fever, chills, body aches, chest pain, shortness of breath, abdominal pain, nausea vomiting or diarrhea.  Will swab last week at her primary care office and tested negative for COVID and flu.  Put on a Z-Pak which she states did not resolve her symptoms.  She has been taking over-the-counter cold and congestion medications with mild temporary relief.  Does have a past history of seasonal allergies but has not been on anything for this for quite some time.  No known history of pulmonary disease.  Past Medical History:  Diagnosis Date   Essential hypertension 2015   Female bladder prolapse    History of cystitis    History of skin cancer    Hyperlipidemia    Obesity     Patient Active Problem List   Diagnosis Date Noted   Varicose veins of leg with swelling 03/18/2021   Left shoulder pain 07/10/2020   Anxiety 07/10/2019   Heartburn 07/07/2019   Educated about COVID-19 virus infection 01/10/2019   Fracture, foot, left, sequela 02/09/2018   Dyspepsia 04/18/2017   Osteoarthritis of both hips 04/18/2017   History of colonic polyps 11/13/2016   Pseudophakia 11/12/2016   Urinary incontinence in female 07/29/2016   Environmental allergies 07/14/2016   Chronic radicular pain of lower back 03/13/2014   Reduced vision 03/13/2014   Essential hypertension 03/08/2014   Obesity (BMI 30.0-34.9) 02/19/2010   Vitamin D deficiency 07/27/2009   Hyperlipidemia LDL goal <100 02/19/2008   Disorder of bladder 02/19/2008    Past Surgical History:  Procedure Laterality Date   CATARACT EXTRACTION  2012   bilateral    COLONOSCOPY  11/06/2011    Procedure: COLONOSCOPY;  Surgeon: Rogene Houston, MD;  [facility];  Service: Endoscopy;  Laterality: N/A;  7:30   COLONOSCOPY N/A 05/21/2017   Procedure: COLONOSCOPY;  Surgeon: Rogene Houston, MD;  Location: [facility];  Service: Endoscopy;  Laterality: N/A;  730-moved to 9/26 @10 :30am per Lelon Frohlich   EYE SURGERY  2011   bilateral cataract with implants   HAMMER TOE SURGERY Left 01/2016   Left bunionectomy     Squamous cell ca rt ant chest 1989     YAG LASER APPLICATION Left 2/42/6834   Procedure: YAG LASER APPLICATION;  Surgeon: Rutherford Guys, MD;  Location: [facility];  Service: Ophthalmology;  Laterality: Left;   YAG LASER APPLICATION Right 1/96/2229   Procedure: YAG LASER APPLICATION;  Surgeon: Rutherford Guys, MD;  Location: [facility];  Service: Ophthalmology;  Laterality: Right;    OB History   No obstetric history on file.      Home Medications    Prior to Admission medications   Medication Sig Start Date End Date Taking? Authorizing Provider  cetirizine (ZYRTEC ALLERGY) 10 MG tablet Take 1 tablet (10 mg total) by mouth daily. 08/15/21  Yes Volney American, PA-C  fluticasone [facility]) 50 MCG/ACT nasal spray Place 1 spray into both nostrils 2 (two) times daily. 08/15/21  Yes Volney American, PA-C  promethazine-dextromethorphan (PROMETHAZINE-DM) 6.25-15 MG/5ML syrup Take 5 mLs by mouth 4 (four) times daily as  needed. 08/15/21  Yes Volney American, PA-C  amLODipine (NORVASC) 2.5 MG tablet TAKE 1 TABLET BY MOUTH EVERY DAY 04/11/21   Fayrene Helper, MD  amLODipine (NORVASC) 5 MG tablet TAKE 1 TABLET BY MOUTH EVERY DAY 06/27/21   Fayrene Helper, MD  calcium elemental as carbonate (BARIATRIC TUMS ULTRA) 400 MG chewable tablet Chew 1,000 mg by mouth 3 (three) times daily. Patient not taking: Reported on 08/08/2021    [provider]  rosuvastatin (CRESTOR) 10 MG tablet TAKE 1 TABLET BY MOUTH EVERY DAY 06/27/21   Fayrene Helper, MD    Family  History Family History  Problem Relation Age of Onset   Stroke Mother    Hypertension Mother    Heart disease Father    Cancer Maternal Grandmother    Anesthesia problems Neg Hx    Hypotension Neg Hx    Malignant hyperthermia Neg Hx    Pseudochol deficiency Neg Hx     Social History Social History   Tobacco Use   Smoking status: Never   Smokeless tobacco: Never  Vaping Use   Vaping Use: Never used  Substance Use Topics   Alcohol use: Yes    Comment: glass of red wine most nights or scotch and water   Drug use: No     Allergies   Ace inhibitors, Penicillins, and Sulfonamide derivatives   Review of Systems Review of Systems PER HPI   Physical Exam Triage Vital Signs ED Triage Vitals  Enc Vitals Group     BP 08/15/21 1048 134/73     Pulse Rate 08/15/21 1048 84     Resp 08/15/21 1048 16     Temp 08/15/21 1048 98.1 F (36.7 C)     Temp Source 08/15/21 1048 Oral     SpO2 08/15/21 1048 97 %     Weight --      Height --      Head Circumference --      Peak Flow --      Pain Score 08/15/21 1049 3     Pain Loc --      Pain Edu? --      Excl. in [place]? --    No data found.  Updated Vital Signs BP 134/73 (BP Location: Right Arm)    Pulse 84    Temp 98.1 F (36.7 C) (Oral)    Resp 16    SpO2 97%   Visual Acuity Right Eye Distance:   Left Eye Distance:   Bilateral Distance:    Right Eye Near:   Left Eye Near:    Bilateral Near:     Physical Exam Vitals and nursing note reviewed.  Constitutional:      Appearance: Normal appearance.  HENT:     Head: Atraumatic.     Right Ear: Tympanic membrane and external ear normal.     Left Ear: Tympanic membrane and external ear normal.     Nose: Rhinorrhea present.     Mouth/Throat:     Mouth: Mucous membranes are moist.     Pharynx: Posterior oropharyngeal erythema present.  Eyes:     Extraocular Movements: Extraocular movements intact.     Conjunctiva/sclera: Conjunctivae normal.  Cardiovascular:     Rate  and Rhythm: Normal rate and regular rhythm.     Heart sounds: Normal heart sounds.  Pulmonary:     Effort: Pulmonary effort is normal.     Breath sounds: Normal breath sounds. No wheezing or rales.  Musculoskeletal:  General: Normal range of motion.     Cervical back: Normal range of motion and neck supple.  Skin:    General: Skin is warm and dry.  Neurological:     Mental Status: She is alert and oriented to person, place, and time.  Psychiatric:        Mood and Affect: Mood normal.        Thought Content: Thought content normal.     UC Treatments / Results  Labs (all labs ordered are listed, but only abnormal results are displayed) Labs Reviewed - No data to display  EKG   Radiology No results found.  Procedures Procedures (including critical care time)  Medications Ordered in UC Medications  dexamethasone (DECADRON) injection 10 mg (10 mg Intramuscular Given 08/15/21 1124)    Initial Impression / Assessment and Plan / UC Course  I have reviewed the triage vital signs and the nursing notes.  Pertinent labs & imaging results that were available during my care of the patient were reviewed by me and considered in my medical decision making (see chart for details).     Suspect some lingering inflammatory symptoms, bronchitis secondary to viral upper respiratory infection versus ongoing seasonal allergies.  We will treat with IM Decadron, start allergy regimen back with Zyrtec and Flonase and Phenergan DM for cough.  Supportive home care and return precautions reviewed.  Final Clinical Impressions(s) / UC Diagnoses   Final diagnoses:  Acute bronchitis, unspecified organism  Seasonal allergic rhinitis due to other allergic trigger   Discharge Instructions   None    ED Prescriptions     Medication Sig Dispense Auth. Provider   cetirizine (ZYRTEC ALLERGY) 10 MG tablet Take 1 tablet (10 mg total) by mouth daily. 30 tablet Volney American, PA-C    fluticasone [facility]) 50 MCG/ACT nasal spray Place 1 spray into both nostrils 2 (two) times daily. [place], [place]   promethazine-dextromethorphan (PROMETHAZINE-DM) 6.25-15 MG/5ML syrup Take 5 mLs by mouth 4 (four) times daily as needed. 100 mL Volney American, [place]      PDMP not reviewed this encounter.   Volney American, [place] 08/15/21 1146

## 2021-08-15 NOTE — ED Triage Notes (Signed)
Patient states she has had a cold for a few weeks.   She states that her PCP put her on Zpak and she finished all of that medication but she is still  coughing and throat ache and runny nose.  She states she has had a fever the past few days.   She states she has a sore throat.   She states that both of her arms are very achy.  She states she was swabbed at the doctor for the Covid/Flu and it was negative.

## 2021-08-20 ENCOUNTER — Encounter: Payer: Self-pay | Admitting: Family Medicine

## 2021-08-20 DIAGNOSIS — J069 Acute upper respiratory infection, unspecified: Secondary | ICD-10-CM | POA: Insufficient documentation

## 2021-08-20 NOTE — Assessment & Plan Note (Signed)
Symptoms  Improving based on report Continue symptomatic treatment already started Ensure adequate fluids and rest [place] [facility] deteriorates or go to UC

## 2021-08-21 ENCOUNTER — Encounter: Payer: PPO | Admitting: Family Medicine

## 2021-08-23 ENCOUNTER — Telehealth: Payer: PPO

## 2021-08-23 ENCOUNTER — Telehealth: Payer: Self-pay | Admitting: *Deleted

## 2021-08-23 NOTE — Telephone Encounter (Signed)
°  Care Management   Follow Up Note   08/23/2021 Name: Yvonne Brewer MRN: [id number] DOB: June 30, 1946   Referred by: Fayrene Helper, MD Reason for referral : Chronic Care Management (HTN, HLD)   A second unsuccessful telephone outreach was attempted today. The patient was referred to the case management team for assistance with care management and care coordination.   Follow Up Plan: Telephone follow up appointment with care management team member scheduled for:  upon care guide rescheduling.  Jacqlyn Larsen [facility], BSN RN Case Manager [facility] [phone]

## 2021-09-03 ENCOUNTER — Telehealth: Payer: Self-pay | Admitting: *Deleted

## 2021-09-03 NOTE — Chronic Care Management (AMB) (Signed)
°  Care Management   Note  09/03/2021 Name: Yvonne Brewer MRN: [id number] DOB: 1946/01/18  Yvonne Brewer is a 76 y.o. year old female who is a primary care patient of Fayrene Helper, MD and is actively engaged with the care management team. I reached out to Yvonne Brewer by phone today to assist with re-scheduling a follow up visit with the RN Case Manager  Follow up plan: Unsuccessful telephone outreach attempt made. A HIPAA compliant phone message was left for the patient providing contact information and requesting a return call.  The care management team will reach out to the patient again over the next 7 days.  If patient returns call to provider office, please advise to call [place] [facility] [place] [facility]  Direct Dial: [phone]

## 2021-09-05 NOTE — Chronic Care Management (AMB) (Signed)
°  Care Management   Note  09/05/2021 Name: Yvonne Brewer MRN: [id number] DOB: [birth year]  Yvonne Brewer is a 76 y.o. year old female who is a primary care patient of Fayrene Helper, MD and is actively engaged with the care management team. I reached out to Yvonne Brewer by phone today to assist with re-scheduling a follow up visit with the RN Case Manager  Follow up plan: Telephone appointment with care management team member scheduled for:09/11/21  [facility]  Direct Dial: [phone]

## 2021-09-11 ENCOUNTER — Ambulatory Visit (INDEPENDENT_AMBULATORY_CARE_PROVIDER_SITE_OTHER): Payer: PPO | Admitting: *Deleted

## 2021-09-11 DIAGNOSIS — I10 Essential (primary) hypertension: Secondary | ICD-10-CM

## 2021-09-11 DIAGNOSIS — E785 Hyperlipidemia, unspecified: Secondary | ICD-10-CM

## 2021-09-11 NOTE — Patient Instructions (Addendum)
Visit Information  Thank you for taking time to visit with me today. Please don't hesitate to contact me if I can be of assistance to you before our next scheduled telephone appointment.  Following are the goals we discussed today:   Take medications as prescribed   Attend all scheduled provider appointments Perform all self care activities independently  Perform IADL's (shopping, preparing meals, housekeeping, managing finances) independently Call provider office for new concerns or questions  check blood pressure weekly choose a place to take my blood pressure (home, clinic or office, retail store) write blood pressure results in a log or diary keep all doctor appointments take medications for blood pressure exactly as prescribed eat more whole grains, fruits and vegetables, lean meats and healthy fats - call doctor with any symptoms you believe are related to your medicine Continue exercising daily (water aerobics) Foods that will make heartburn worse are caffeine, acidic foods (tomatoes, coffee, etc) try to limit/ avoid Keep yourself at a healthy weight RN care manager will outreach by telephone quarterly Food Choices for Gastroesophageal Reflux Disease, Adult When you have gastroesophageal reflux disease (GERD), the foods you eat and your eating habits are very important. Choosing the right foods can help ease your discomfort. Think about working with a food expert (dietitian) to help you make good choices. What are tips for following this plan? Reading food labels Look for foods that are low in saturated fat. Foods that may help with your symptoms include: Foods that have less than 5% of daily value (DV) of fat. Foods that have 0 grams of trans fat. Cooking Do not fry your food. Cook your food by baking, steaming, grilling, or broiling. These are all methods that do not need a lot of fat for cooking. To add flavor, try to use herbs that are low in spice and acidity. Meal  planning  Choose healthy foods that are low in fat, such as: Fruits and vegetables. Whole grains. Low-fat dairy products. Lean meats, fish, and poultry. Eat small meals often instead of eating 3 large meals each day. Eat your meals slowly in a place where you are relaxed. Avoid bending over or lying down until 2-3 hours after eating. Limit high-fat foods such as fatty meats or fried foods. Limit your intake of fatty foods, such as oils, butter, and shortening. Avoid the following as told by your doctor: Foods that cause symptoms. These may be different for different people. Keep a food diary to keep track of foods that cause symptoms. Alcohol. Drinking a lot of liquid with meals. Eating meals during the 2-3 hours before bed. Lifestyle Stay at a healthy weight. Ask your doctor what weight is healthy for you. If you need to lose weight, work with your doctor to do so safely. Exercise for at least 30 minutes on 5 or more days each week, or as told by your doctor. Wear loose-fitting clothes. Do not smoke or use any products that contain nicotine or tobacco. If you need help quitting, ask your doctor. Sleep with the head of your bed higher than your feet. Use a wedge under the mattress or blocks under the bed frame to raise the head of the bed. Chew sugar-free gum after meals. What foods should eat? Eat a healthy, well-balanced diet of fruits, vegetables, whole grains, low-fat dairy products, lean meats, fish, and poultry. Each person is different. Foods that may cause symptoms in one person may not cause any symptoms in another person. Work with your doctor to  find foods that are safe for you. The items listed above may not be a complete list of what you can eat and drink. Contact a food expert for more options. What foods should I avoid? Limiting some of these foods may help in managing the symptoms of GERD. Everyone is different. Talk with a food expert or your doctor to help you find the  exact foods to avoid, if any. Fruits Any fruits prepared with added fat. Any fruits that cause symptoms. For some people, this may include citrus fruits, such as oranges, grapefruit, pineapple, and lemons. Vegetables Deep-fried vegetables. [place] fries. Any vegetables prepared with added fat. Any vegetables that cause symptoms. For some people, this may include tomatoes and tomato products, chili peppers, onions and garlic, and horseradish. Grains Pastries or quick breads with added fat. Meats and other proteins High-fat meats, such as fatty beef or pork, hot dogs, ribs, ham, sausage, salami, and bacon. Fried meat or protein, including fried fish and fried chicken. Nuts and nut butters, in large amounts. Dairy Whole milk and chocolate milk. Sour cream. Cream. Ice cream. Cream cheese. Milkshakes. Fats and oils Butter. Margarine. Shortening. Ghee. Beverages Coffee and tea, with or without caffeine. Carbonated beverages. Sodas. Energy drinks. Fruit juice made with acidic fruits, such as orange or grapefruit. Tomato juice. Alcoholic drinks. Sweets and desserts Chocolate and cocoa. Donuts. Seasonings and condiments Pepper. Peppermint and spearmint. Added salt. Any condiments, herbs, or seasonings that cause symptoms. For some people, this may include curry, hot sauce, or vinegar-based salad dressings. The items listed above may not be a complete list of what you should not eat and drink. Contact a food expert for more options. Questions to ask your doctor Diet and lifestyle changes are often the first steps that are taken to manage symptoms of GERD. If diet and lifestyle changes do not help, talk with your doctor about taking medicines. Where to find more information International Foundation for Gastrointestinal Disorders: aboutgerd.org Summary When you have GERD, food and lifestyle choices are very important in easing your symptoms. Eat small meals often instead of 3 large meals a day. Eat your  meals slowly and in a place where you are relaxed. Avoid bending over or lying down until 2-3 hours after eating. Limit high-fat foods such as fatty meats or fried foods. This information is not intended to replace advice given to you by your health care provider. Make sure you discuss any questions you have with your health care provider. Document Revised: 02/21/2020 Document Reviewed: 02/21/2020 Elsevier Patient Education  2022 [place] next appointment is by telephone on 12/04/21 at 3 pm  Please call the care guide team at [phone] if you need to cancel or reschedule your appointment.   If you are experiencing a Mental Health or [place] or need someone to talk to, please call the Suicide and Crisis Lifeline: 988 call the [place] National Suicide Prevention Lifeline: 478-255-2474 or TTY: 248 146 4735 TTY (262)076-9120) to talk to a trained counselor call 1-800-273-TALK (toll free, 24 hour hotline) go to [facility] [address] [place] [phone]) call the [place]: [phone] call 911   Patient verbalizes understanding of instructions and care plan provided today and agrees to view in [place]. Active MyChart status confirmed with patient.    Jacqlyn Larsen [facility], BSN RN Case Manager [facility] [phone]

## 2021-09-11 NOTE — Chronic Care Management (AMB) (Signed)
Chronic Care Management   CCM RN Visit Note  09/11/2021 Name: Yvonne Brewer MRN: [id number] DOB: 1945-08-30  Subjective: Yvonne Brewer is a 76 y.o. year old female who is a primary care patient of Fayrene Helper, MD. The care management team was consulted for assistance with disease management and care coordination needs.    Engaged with patient by telephone for follow up visit in response to provider referral for case management and/or care coordination services.   Consent to Services:  The patient was given information about Chronic Care Management services, agreed to services, and gave verbal consent prior to initiation of services.  Please see initial visit note for detailed documentation.   Patient agreed to services and verbal consent obtained.   Assessment: Review of patient past medical history, allergies, medications, health status, including review of consultants reports, laboratory and other test data, was performed as part of comprehensive evaluation and provision of chronic care management services.   SDOH (Social Determinants of Health) assessments and interventions performed:    CCM Care Plan  Allergies  Allergen Reactions   Ace Inhibitors Cough    Cough   Penicillins Hives    Has patient had a PCN reaction causing immediate rash, facial/tongue/throat swelling, SOB or lightheadedness with hypotension: No Has patient had a PCN reaction causing severe rash involving mucus membranes or skin necrosis: No Has patient had a PCN reaction that required hospitalization No Has patient had a PCN reaction occurring within the last 10 years: No If all of the above answers are "NO", then may proceed with Cephalosporin use.    Sulfonamide Derivatives Hives    Outpatient Encounter Medications as of 09/11/2021  Medication Sig   amLODipine (NORVASC) 2.5 MG tablet TAKE 1 TABLET BY MOUTH EVERY DAY   amLODipine (NORVASC) 5 MG tablet TAKE 1 TABLET BY MOUTH EVERY DAY    calcium elemental as carbonate (BARIATRIC TUMS ULTRA) 400 MG chewable tablet Chew 1,000 mg by mouth 3 (three) times daily.   cetirizine (ZYRTEC ALLERGY) 10 MG tablet Take 1 tablet (10 mg total) by mouth daily.   fluticasone (FLONASE) 50 MCG/ACT nasal spray Place 1 spray into both nostrils 2 (two) times daily.   promethazine-dextromethorphan (PROMETHAZINE-DM) 6.25-15 MG/5ML syrup Take 5 mLs by mouth 4 (four) times daily as needed.   rosuvastatin (CRESTOR) 10 MG tablet TAKE 1 TABLET BY MOUTH EVERY DAY   No facility-administered encounter medications on file as of 09/11/2021.    Patient Active Problem List   Diagnosis Date Noted   URTI (infection of the upper respiratory tract) 08/20/2021   Varicose veins of leg with swelling 03/18/2021   Left shoulder pain 07/10/2020   Anxiety 07/10/2019   Heartburn 07/07/2019   Educated about COVID-19 virus infection 01/10/2019   Fracture, foot, left, sequela 02/09/2018   Dyspepsia 04/18/2017   Osteoarthritis of both hips 04/18/2017   History of colonic polyps 11/13/2016   Pseudophakia 11/12/2016   Urinary incontinence in female 07/29/2016   Environmental allergies 07/14/2016   Chronic radicular pain of lower back 03/13/2014   Reduced vision 03/13/2014   Essential hypertension 03/08/2014   Obesity (BMI 30.0-34.9) 02/19/2010   Vitamin D deficiency 07/27/2009   Hyperlipidemia LDL goal <100 02/19/2008   Disorder of bladder 02/19/2008    Conditions to be addressed/monitored:HTN and HLD  Care Plan : RN Care Manager Plan of Care  Updates made by Kassie Mends, RN since 09/11/2021 12:00 AM     Problem: No plan of care established for  management of chronic disease states  (HTN, HLD)   Priority: High     Long-Range Goal: Development of plan of care for chronic disease management  (HTN, HLD)   Start Date: 09/11/2021  Expected End Date: 03/10/2022  Priority: High  Note:   Current Barriers:  Knowledge Deficits related to plan of care for management  of HTN and HLD  Patient lives with her spouse, is independent in all aspects of her care, continues to drive, exercises daily (water aerobics), does not have HCPOA/ living will in place but reports she is working on this.  Has cane on hand but does not use.  Patient reports she has had some issues with heartburn and is trying to do better with her diet and decrease carbohydrate intake and possibly try to lose weight. ASCVD risk enhancing conditions: age >50, HTN Does not adhere to provider recommendations re:  does not always adhere to heart healthy diet Patient reports she has blood pressure cuff but does not check blood pressure  RNCM Clinical Goal(s):  Patient will verbalize understanding of plan for management of HTN and HLD as evidenced by pt report, review of EHR and  through collaboration with RN Care manager, provider, and care team.   Interventions: 1:1 collaboration with primary care provider regarding development and update of comprehensive plan of care as evidenced by provider attestation and co-signature Inter-disciplinary care team collaboration (see longitudinal plan of care) Evaluation of current treatment plan related to  self management and patient's adherence to plan as established by provider   Hyperlipidemia:  (Status: Goal on track: NO.) Long Term Goal  Lab Results  Component Value Date   CHOL 253 (H) 01/15/2021   HDL 56 01/15/2021   LDLCALC 164 (H) 01/15/2021   TRIG 182 (H) 01/15/2021   CHOLHDL 4.5 (H) 01/15/2021     Medication review performed; medication list updated in electronic medical record.  Provider established cholesterol goals reviewed; Reviewed role and benefits of statin for ASCVD risk reduction; Reviewed importance of limiting foods high in cholesterol; Reviewed exercise goals and target of 150 minutes per week; Reviewed heart healthy diet  Reviewed strategies for alleviating heart burn, foods to avoid/ limit Discussed plan of care with pt and per  pt request will outreach pt quarterly  Hypertension Interventions:  (Status:  Goal on track:  Yes.) Long Term Goal Last practice recorded BP readings:  BP Readings from Last 3 Encounters:  08/15/21 134/73  03/14/21 118/74  01/12/21 (!) 144/72  Most recent eGFR/CrCl:  Lab Results  Component Value Date   EGFR 66 01/15/2021    No components found for: CRCL  Reviewed medications with patient and discussed importance of compliance Discussed plans with patient for ongoing care management follow up and provided patient with direct contact information for care management team Discussed complications of poorly controlled blood pressure such as heart disease, stroke, circulatory complications, vision complications, kidney impairment, sexual dysfunction Encouraged to continue water aerobics daily Reviewed signs/ symptoms exacerbation bronchitis   Patient Goals/Self-Care Activities: Take medications as prescribed   Attend all scheduled provider appointments Perform all self care activities independently  Perform IADL's (shopping, preparing meals, housekeeping, managing finances) independently Call provider office for new concerns or questions  check blood pressure weekly choose a place to take my blood pressure (home, clinic or office, retail store) write blood pressure results in a log or diary keep all doctor appointments take medications for blood pressure exactly as prescribed eat more whole grains, fruits and vegetables, lean  meats and healthy fats - call doctor with any symptoms you believe are related to your medicine Continue exercising daily (water aerobics) Foods that will make heartburn worse are caffeine, acidic foods (tomatoes, coffee, etc) try to limit/ avoid Keep yourself at a healthy weight RN care manager will outreach by telephone quarterly       Plan:Telephone follow up appointment with care management team member scheduled for:  12/04/21  Jacqlyn Larsen [facility], BSN RN  Case Manager [facility] [phone]

## 2021-09-25 DIAGNOSIS — I10 Essential (primary) hypertension: Secondary | ICD-10-CM | POA: Diagnosis not present

## 2021-09-25 DIAGNOSIS — E785 Hyperlipidemia, unspecified: Secondary | ICD-10-CM | POA: Diagnosis not present

## 2021-09-27 ENCOUNTER — Other Ambulatory Visit: Payer: Self-pay

## 2021-09-27 NOTE — Patient Instructions (Signed)

## 2021-10-05 ENCOUNTER — Other Ambulatory Visit: Payer: Self-pay | Admitting: Family Medicine

## 2021-10-11 ENCOUNTER — Other Ambulatory Visit (HOSPITAL_COMMUNITY): Payer: Self-pay | Admitting: Family Medicine

## 2021-10-11 DIAGNOSIS — Z1231 Encounter for screening mammogram for malignant neoplasm of breast: Secondary | ICD-10-CM

## 2021-10-12 DIAGNOSIS — M75112 Incomplete rotator cuff tear or rupture of left shoulder, not specified as traumatic: Secondary | ICD-10-CM | POA: Diagnosis not present

## 2021-10-15 ENCOUNTER — Ambulatory Visit (HOSPITAL_COMMUNITY)
Admission: RE | Admit: 2021-10-15 | Discharge: 2021-10-15 | Disposition: A | Discharge status: Home / Self Care | Payer: PPO | Source: Ambulatory Visit | Attending: Family Medicine | Admitting: Family Medicine

## 2021-10-15 ENCOUNTER — Other Ambulatory Visit: Payer: Self-pay

## 2021-10-15 DIAGNOSIS — Z1231 Encounter for screening mammogram for malignant neoplasm of breast: Secondary | ICD-10-CM | POA: Diagnosis not present

## 2021-10-16 ENCOUNTER — Other Ambulatory Visit (HOSPITAL_COMMUNITY): Payer: Self-pay | Admitting: Family Medicine

## 2021-10-16 DIAGNOSIS — R928 Other abnormal and inconclusive findings on diagnostic imaging of breast: Secondary | ICD-10-CM

## 2021-10-19 DIAGNOSIS — R7301 Impaired fasting glucose: Secondary | ICD-10-CM | POA: Diagnosis not present

## 2021-10-19 DIAGNOSIS — E785 Hyperlipidemia, unspecified: Secondary | ICD-10-CM | POA: Diagnosis not present

## 2021-10-19 DIAGNOSIS — E559 Vitamin D deficiency, unspecified: Secondary | ICD-10-CM | POA: Diagnosis not present

## 2021-10-19 DIAGNOSIS — I10 Essential (primary) hypertension: Secondary | ICD-10-CM | POA: Diagnosis not present

## 2021-10-20 LAB — CBC
Hematocrit: 40.6 % (ref 34.0–46.6)
Hemoglobin: 13.2 g/dL (ref 11.1–15.9)
MCH: 30.9 pg (ref 26.6–33.0)
MCHC: 32.5 g/dL (ref 31.5–35.7)
MCV: 95 fL (ref 79–97)
Platelets: 302 10*3/uL (ref 150–450)
RBC: 4.27 x10E6/uL (ref 3.77–5.28)
RDW: 12.4 % (ref 11.7–15.4)
WBC: 5 10*3/uL (ref 3.4–10.8)

## 2021-10-20 LAB — CMP14+EGFR
ALT: 13 IU/L (ref 0–32)
AST: 18 IU/L (ref 0–40)
Albumin/Globulin Ratio: 1.8 (ref 1.2–2.2)
Albumin: 4.2 g/dL (ref 3.7–4.7)
Alkaline Phosphatase: 60 IU/L (ref 44–121)
BUN/Creatinine Ratio: 19 (ref 12–28)
BUN: 16 mg/dL (ref 8–27)
Bilirubin Total: 0.5 mg/dL (ref 0.0–1.2)
CO2: 23 mmol/L (ref 20–29)
Calcium: 9.4 mg/dL (ref 8.7–10.3)
Chloride: 104 mmol/L (ref 96–106)
Creatinine, Ser: 0.84 mg/dL (ref 0.57–1.00)
Globulin, Total: 2.3 g/dL (ref 1.5–4.5)
Glucose: 96 mg/dL (ref 70–99)
Potassium: 4.8 mmol/L (ref 3.5–5.2)
Sodium: 141 mmol/L (ref 134–144)
Total Protein: 6.5 g/dL (ref 6.0–8.5)
eGFR: 72 mL/min/{1.73_m2} (ref >59)

## 2021-10-20 LAB — HEMOGLOBIN A1C
Est. average glucose Bld gHb Est-mCnc: 114 mg/dL
Hgb A1c MFr Bld: 5.6 % (ref 4.8–5.6)

## 2021-10-20 LAB — LIPID PANEL
Chol/HDL Ratio: 4.6 ratio — ABNORMAL HIGH (ref 0.0–4.4)
Cholesterol, Total: 229 mg/dL — ABNORMAL HIGH (ref 100–199)
HDL: 50 mg/dL (ref >39)
LDL Chol Calc (NIH): 148 mg/dL — ABNORMAL HIGH (ref 0–99)
Triglycerides: 170 mg/dL — ABNORMAL HIGH (ref 0–149)
VLDL Cholesterol Cal: 31 mg/dL (ref 5–40)

## 2021-10-20 LAB — VITAMIN D 25 HYDROXY (VIT D DEFICIENCY, FRACTURES): Vit D, 25-Hydroxy: 20.2 ng/mL — ABNORMAL LOW (ref 30.0–100.0)

## 2021-10-22 DIAGNOSIS — D225 Melanocytic nevi of trunk: Secondary | ICD-10-CM | POA: Diagnosis not present

## 2021-10-22 DIAGNOSIS — L821 Other seborrheic keratosis: Secondary | ICD-10-CM | POA: Diagnosis not present

## 2021-10-23 ENCOUNTER — Ambulatory Visit (HOSPITAL_COMMUNITY)
Admission: RE | Admit: 2021-10-23 | Discharge: 2021-10-23 | Disposition: A | Discharge status: Home / Self Care | Payer: PPO | Source: Ambulatory Visit | Attending: Family Medicine | Admitting: Family Medicine

## 2021-10-23 ENCOUNTER — Other Ambulatory Visit: Payer: Self-pay

## 2021-10-23 ENCOUNTER — Telehealth: Payer: Self-pay | Admitting: Family Medicine

## 2021-10-23 ENCOUNTER — Encounter: Payer: Self-pay | Admitting: Family Medicine

## 2021-10-23 ENCOUNTER — Ambulatory Visit (INDEPENDENT_AMBULATORY_CARE_PROVIDER_SITE_OTHER): Payer: PPO | Admitting: Family Medicine

## 2021-10-23 VITALS — BP 134/79 | HR 88 | Resp 16 | Ht 62.0 in | Wt 190.0 lb

## 2021-10-23 DIAGNOSIS — I10 Essential (primary) hypertension: Secondary | ICD-10-CM

## 2021-10-23 DIAGNOSIS — M25512 Pain in left shoulder: Secondary | ICD-10-CM

## 2021-10-23 DIAGNOSIS — E669 Obesity, unspecified: Secondary | ICD-10-CM | POA: Diagnosis not present

## 2021-10-23 DIAGNOSIS — G8929 Other chronic pain: Secondary | ICD-10-CM

## 2021-10-23 DIAGNOSIS — R928 Other abnormal and inconclusive findings on diagnostic imaging of breast: Secondary | ICD-10-CM | POA: Diagnosis not present

## 2021-10-23 DIAGNOSIS — R922 Inconclusive mammogram: Secondary | ICD-10-CM | POA: Diagnosis not present

## 2021-10-23 DIAGNOSIS — E785 Hyperlipidemia, unspecified: Secondary | ICD-10-CM

## 2021-10-23 DIAGNOSIS — N6489 Other specified disorders of breast: Secondary | ICD-10-CM | POA: Diagnosis not present

## 2021-10-23 DIAGNOSIS — Z0000 Encounter for general adult medical examination without abnormal findings: Secondary | ICD-10-CM

## 2021-10-23 MED ORDER — ROSUVASTATIN CALCIUM 5 MG PO TABS: ORAL_TABLET | ORAL | 3 refills | Status: DC

## 2021-10-23 NOTE — Patient Instructions (Signed)
F/u in 6 months, call if you need me soonerreferred to PT for 6 weeks for left shouldert pain  New is crestor 5 mg one three tomes weekly ( prescribed)  Please commit to tums one tablet 3 time daily for bones  Vit D3, 2000 IU once daily, your level is low  INCREASE vegetable  intake except corn and potatoes  Try to start with boiled eggs , minus the yolks) and a bowl of fruit for breakfast with decaf tea or cofffee  Compression device for legs is good  Fasting lipid, cmp and EGFR and CBC 5 days before next visit  You are being referred to twice weekly PT for left shoulder pain for 6 weeks, contine icing, compression and topical rubs as well as start home exercises   Thanks for choosing [facility], we consider it a privelige to serve you.  Best for 2023!

## 2021-10-23 NOTE — Assessment & Plan Note (Signed)
Hyperlipidemia:Low fat diet discussed and encouraged.   Lipid Panel  Lab Results  Component Value Date   CHOL 229 (H) 10/19/2021   HDL 50 10/19/2021   LDLCALC 148 (H) 10/19/2021   TRIG 170 (H) 10/19/2021   CHOLHDL 4.6 (H) 10/19/2021     Needs to take statin, she agrees and 3 times weekly  Low dose restor is prescribed

## 2021-10-23 NOTE — Assessment & Plan Note (Signed)
°  Patient re-educated about  the importance of commitment to a  minimum of 150 minutes of exercise per week as able.  The importance of healthy food choices with portion control discussed, as well as eating regularly and within a 12 hour window most days. The need to choose "clean , green" food 50 to 75% of the time is discussed, as well as to make water the primary drink and set a goal of 64 ounces water daily.    Weight /BMI 10/23/2021 03/14/2021 01/12/2021  WEIGHT 190 lb 188 lb 188 lb  HEIGHT 5\' 2"  5\' 2"  5\' 2"   BMI 34.75 kg/m2 34.39 kg/m2 34.39 kg/m2

## 2021-10-23 NOTE — Assessment & Plan Note (Signed)

## 2021-10-23 NOTE — Telephone Encounter (Signed)
[facility] called in on patient behalf   Patient needs order changed from physical therapy to occupational therapy.

## 2021-10-23 NOTE — Assessment & Plan Note (Signed)
Daily pain not aggravated by movement or trauma, rated at 8, will refer PT, encourage icing and topiucal rubs

## 2021-10-23 NOTE — Progress Notes (Signed)
° ° °  Yvonne Brewer     MRN: [id number]      DOB: April 12, 1946  HPI: Patient is in for annual physical exam. C/o left shoulder pain rated at 8, does not want surgery interested in PT. Recent labs,  are reviewed. Immunization is reviewed , and  updated if needed.   PE: BP 134/79    Pulse 88    Resp 16    Ht 5\' 2"  (1.575 m)    Wt 190 lb (86.2 kg)    SpO2 96%    BMI 34.75 kg/m   Pleasant  female, alert and oriented x 3, in no cardio-pulmonary distress. Afebrile. HEENT No facial trauma or asymetry. Sinuses non tender.  Extra occullar muscles intact.. External ears normal, . Neck: supple, no adenopathy,JVD or thyromegaly.No bruits.  Chest: Clear to ascultation bilaterally.No crackles or wheezes. Non tender to palpation    Cardiovascular system; Heart sounds normal,  S1 and  S2 ,no S3.  No murmur, or thrill. Apical beat not displaced Peripheral pulses normal.  Abdomen: Soft, non tender, no organomegaly or masses. No bruits. Bowel sounds normal. No guarding, tenderness or rebound.     Musculoskeletal exam: Decreased ROM of spine, hips , shoulders and knees. No deformity ,swelling or crepitus noted. No muscle wasting or atrophy.   Neurologic: Cranial nerves 2 to 12 intact. Power, tone ,sensation and reflexes normal throughout. No disturbance in gait. No tremor.  Skin: Intact, no ulceration, erythema , scaling or rash noted. Pigmentation normal throughout  Psych; Normal mood and affect. Judgement and concentration normal   Assessment & Plan:  Left shoulder pain Daily pain not aggravated by movement or trauma, rated at 8, will refer PT, encourage icing and topiucal rubs  Annual physical exam Annual exam as documented. Counseling done  re healthy lifestyle involving commitment to 150 minutes exercise per week, heart healthy diet, and attaining healthy weight.The importance of adequate sleep also discussed. Regular seat belt use and home safety, is also  discussed. Changes in health habits are decided on by the patient with goals and time frames  set for achieving them. Immunization and cancer screening needs are specifically addressed at this visit.   Hyperlipidemia LDL goal <100 Hyperlipidemia:Low fat diet discussed and encouraged.   Lipid Panel  Lab Results  Component Value Date   CHOL 229 (H) 10/19/2021   HDL 50 10/19/2021   LDLCALC 148 (H) 10/19/2021   TRIG 170 (H) 10/19/2021   CHOLHDL 4.6 (H) 10/19/2021     Needs to take statin, she agrees and 3 times weekly  Low dose restor is prescribed  Obesity (BMI 30.0-34.9)  Patient re-educated about  the importance of commitment to a  minimum of 150 minutes of exercise per week as able.  The importance of healthy food choices with portion control discussed, as well as eating regularly and within a 12 hour window most days. The need to choose "clean , green" food 50 to 75% of the time is discussed, as well as to make water the primary drink and set a goal of 64 ounces water daily.    Weight /BMI 10/23/2021 03/14/2021 01/12/2021  WEIGHT 190 lb 188 lb 188 lb  HEIGHT 5\' 2"  5\' 2"  5\' 2"   BMI 34.75 kg/m2 34.39 kg/m2 34.39 kg/m2

## 2021-10-24 ENCOUNTER — Other Ambulatory Visit: Payer: Self-pay

## 2021-10-24 DIAGNOSIS — G8929 Other chronic pain: Secondary | ICD-10-CM

## 2021-10-24 DIAGNOSIS — M25512 Pain in left shoulder: Secondary | ICD-10-CM

## 2021-10-24 NOTE — Telephone Encounter (Signed)
Changed referral

## 2021-10-26 ENCOUNTER — Other Ambulatory Visit: Payer: Self-pay

## 2021-10-26 ENCOUNTER — Encounter (HOSPITAL_COMMUNITY): Payer: Self-pay

## 2021-10-26 ENCOUNTER — Ambulatory Visit (HOSPITAL_COMMUNITY): Payer: PPO | Attending: Family Medicine

## 2021-10-26 DIAGNOSIS — R29898 Other symptoms and signs involving the musculoskeletal system: Secondary | ICD-10-CM | POA: Insufficient documentation

## 2021-10-26 DIAGNOSIS — M25612 Stiffness of left shoulder, not elsewhere classified: Secondary | ICD-10-CM | POA: Diagnosis not present

## 2021-10-26 DIAGNOSIS — M25512 Pain in left shoulder: Secondary | ICD-10-CM | POA: Diagnosis not present

## 2021-10-26 DIAGNOSIS — G8929 Other chronic pain: Secondary | ICD-10-CM | POA: Diagnosis not present

## 2021-10-26 NOTE — Patient Instructions (Addendum)
Complete the following exercises 2-3 times a day. ? ?Doorway Stretch ? ?Place each hand opposite each other on the doorway. (You can change where you feel the stretch by moving arms higher or lower.) ?Step through with one foot and bend front knee until a stretch is felt and hold. ?Step through with the opposite foot on the next rep. ?Hold for __20-30__ seconds. Repeat __2__times.  ? ? ? ? ? ?Internal Rotation Across Back ? ?Grab the end of a towel with your affected side, palm facing backwards. Grab the towel with your unaffected side and pull your affected hand across your back until you feel a stretch in the front of your shoulder. If you feel pain, pull just to the pain, do not pull through the pain. Hold. Return your affected arm to your side. Try to keep your hand/arm close to your body during the entire movement.   ?  Hold for 20-30 seconds. Complete 2 times.    ? ? ? ? ? ?Wall Flexion ? ?Slide your arm up the wall or door frame until a stretch is felt in your shoulder . Hold for 20-30 seconds. Complete 2 times  ? ? ? ?Shoulder Abduction Stretch ? ?Stand side ways by a wall with affected up on wall. Gently step in toward wall to feel stretch. Hold for 20-30 seconds. Complete 2 times. ? ?  ? ? ?1) Seated Row ? ? ?Sit up straight with elbows by your sides. Pull back with shoulders/elbows, keeping forearms straight, as if pulling back on the reins of a horse. Squeeze shoulder blades together. Repeat _10-15__times, __2-3__sets/day ? ? ? ? ?3) Shoulder Extension ? ? ? ?Sit up straight with both arms by your side, draw your arms back behind your waist. Keep your elbows straight. Repeat __10-15__times, __2-3__sets/day. ? ? ? ? ?  ?

## 2021-10-26 NOTE — Therapy (Signed)
[facility] [address] [place], [place], [address] Phone: [phone]   Fax:  [phone]  Occupational Therapy Evaluation  Patient Details  Name: Yvonne Brewer MRN: [id number] Date of Birth: 02/22/1946 Referring Provider (OT): Tula Nakayama. MD   Encounter Date: 10/26/2021   OT End of Session - 10/26/21 1245     Visit Number 1    Number of Visits 1    Authorization Type [employer]    Authorization Time Period $15 copay no visit limit    Progress Note Due on Visit 10    OT Start Time 1115    OT Stop Time 1213    OT Time Calculation (min) 58 min    Activity Tolerance Patient tolerated treatment well    Behavior During Therapy WFL for tasks assessed/performed             Past Medical History:  Diagnosis Date   Essential hypertension 2015   Female bladder prolapse    History of cystitis    History of skin cancer    Hyperlipidemia    Obesity     Past Surgical History:  Procedure Laterality Date   CATARACT EXTRACTION  2012   bilateral    COLONOSCOPY  11/06/2011   Procedure: COLONOSCOPY;  Surgeon: Rogene Houston, MD;  Location: [facility];  Service: Endoscopy;  Laterality: N/A;  7:30   COLONOSCOPY N/A 05/21/2017   Procedure: COLONOSCOPY;  Surgeon: Rogene Houston, MD;  Location: [facility];  Service: Endoscopy;  Laterality: N/A;  730-moved to 9/26 @10 :30am per Lelon Frohlich   EYE SURGERY  2011   bilateral cataract with implants   HAMMER TOE SURGERY Left 01/2016   Left bunionectomy     Squamous cell ca rt ant chest 1989     YAG LASER APPLICATION Left 5/42/7062   Procedure: YAG LASER APPLICATION;  Surgeon: Rutherford Guys, MD;  Location: [facility];  Service: Ophthalmology;  Laterality: Left;   YAG LASER APPLICATION Right 3/76/2831   Procedure: YAG LASER APPLICATION;  Surgeon: Rutherford Guys, MD;  Location: [facility];  Service: Ophthalmology;  Laterality: Right;    There were no vitals filed for this visit.    Subjective Assessment - 10/26/21 1132     Subjective  S: The pain has never really gone away.    Pertinent History Patient is a 76 y/o female returning to our clinic with continued left shoulder pain. This pain has been ongoing since approximately November 2021. She received OT services from this clinic for chronic left shoulder pain and was discharged 08/22/21 with functional strength and close to full A/ROM. The only MRI was performed on 02/14/21 and revealed a 0.7 cm from front to back near full-thickness articular sided tear in the supraspinatus. Retraction  is 1 cm or less. Patient is not wanting to have surgery unless absolutely needed. Dr. Moshe Cipro has referred patient to occupational therapy for evaluation and treatment.    Patient Stated Goals To not have surgery.    Currently in Pain? Other (Comment)    Pain Score --   With activity and use 8/10; sometimes at rest 3/10   Pain Location Shoulder    Pain Orientation Left    Pain Descriptors / Indicators Sharp;Constant    Pain Type Chronic pain    Pain Onset Other (comment)   over a year   Pain Frequency Constant    Aggravating Factors  increased use, movement (certain ones)    Pain Relieving Factors ice -  sometimes, but doesn't help once remove.    Effect of Pain on Daily Activities Moderate effect    Multiple Pain Sites No               [facility] OT Assessment - 10/26/21 1129       Assessment   Medical Diagnosis Chronic Left Shoulder    Referring Provider (OT) Tula Nakayama. MD    Onset Date/Surgical Date --   approximately Nov. 2021   Hand Dominance Right    Next MD Visit 04/24/22    Prior Therapy [facility] for left shoulder pain Dec. 2022.      Precautions   Precautions None      Balance Screen   Has the patient fallen in the past 6 months No      Home  Environment   Family/patient expects to be discharged to: Private residence      Prior Function   Level of [place] Retired    Leisure  Enjoys going to [employer] and visiting with family/grandchildren      ADL   ADL comments Pain experienced with a lot of movement and use.      Mobility   Mobility Status Independent      Vision - History   Baseline Vision No visual deficits      Cognition   Overall Cognitive Status Within Functional Limits for tasks assessed      Observation/Other Assessments   Focus on Therapeutic Outcomes (FOTO)  N/A      Posture/Postural Control   Posture/Postural Control Postural limitations    Postural Limitations Forward head;Rounded Shoulders      ROM / Strength   AROM / PROM / Strength AROM;Strength;PROM      AROM   Overall AROM Comments Assessed seated. IR/er adducted    AROM Assessment Site Shoulder    Right/Left Shoulder Left    Left Shoulder Flexion 148 Degrees    Left Shoulder ABduction 143 Degrees    Left Shoulder Internal Rotation 90 Degrees    Left Shoulder External Rotation 65 Degrees      PROM   Overall PROM  Within functional limits for tasks performed    Overall PROM Comments LUE shoulder in all ranges.      Strength   Overall Strength Comments Assessed seated. IR/er adducted    Strength Assessment Site Shoulder    Right/Left Shoulder Left    Left Shoulder Flexion 4-/5    Left Shoulder ABduction 4/5    Left Shoulder Internal Rotation 5/5    Left Shoulder External Rotation 4+/5                              OT Education - 10/26/21 1237     Education Details shoulder stretches, A/ROM scapula    Person(s) Educated Patient    Methods Explanation;Demonstration;Verbal cues;Tactile cues;Handout    Comprehension Returned demonstration;Verbalized understanding              OT Short Term Goals - 10/26/21 1259       OT SHORT TERM GOAL #1   Title Pt will be provided with and educated on HEP to improve LUE functional use, decrease discomfort and increase ROM and scapular strength.    Time 1    Period Days    Status Achieved    Target Date  10/26/21  Plan - 10/26/21 1246     Clinical Impression Statement A: Patient is currently experiencing an increase in pain and decreased ROM and strength compared to 1 year ago when she was discharged from OT services. Discussed options for treatment. Patient reports that she has travel plans to the beach (2 times) plus a visit to [place] to visit her grandchildren. She will be unable to attend appointments. Pt was provided with a HEP and they were reviewed and modified in clinic to assess form and technique. Plan is to complete HEP whiel traveling and once she returns home around April 9th, she will call the clinic to let us know if she needs to return for in-person treatment or be discharged.    OT Occupational Profile and History Problem Focused Assessment - Including review of records relating to presenting problem    Occupational performance deficits (Please refer to evaluation for details): ADL's    Body Structure / Function / Physical Skills ADL;UE functional use;Pain;ROM;Strength    Rehab Potential Good    Clinical Decision Making Limited treatment options, no task modification necessary    Comorbidities Affecting Occupational Performance: Presence of comorbidities impacting occupational performance    Comorbidities impacting occupational performance description: RTC tear present    Modification or Assistance to Complete Evaluation  Min-Moderate modification of tasks or assist with assess necessary to complete eval    OT Frequency One time visit   Will change if needed once she returns from her traveling.   OT Duration --   TBD   OT Treatment/Interventions Patient/family education    Plan P: Pt on hold until she returns from traveling. Expected to return around April 9th. She will contact clinic. If we do not hear from patient please call to discuss discharge or returning to clinic to reassess left shoulder. Pt to complete HEP in the mean time.    Consulted  and Agree with Plan of Care Patient             Patient will benefit from skilled therapeutic intervention in order to improve the following deficits and impairments:   Body Structure / Function / Physical Skills: ADL, UE functional use, Pain, ROM, Strength       Visit Diagnosis: Other symptoms and signs involving the musculoskeletal system - Plan: Ot plan of care cert/re-cert  Chronic left shoulder pain - Plan: Ot plan of care cert/re-cert  Stiffness of left shoulder, not elsewhere classified - Plan: Ot plan of care cert/re-cert    Problem List Patient Active Problem List   Diagnosis Date Noted   Varicose veins of leg with swelling 03/18/2021   Left shoulder pain 07/10/2020   Anxiety 07/10/2019   Heartburn 07/07/2019   Educated about COVID-19 virus infection 01/10/2019   Fracture, foot, left, sequela 02/09/2018   Dyspepsia 04/18/2017   Osteoarthritis of both hips 04/18/2017   History of colonic polyps 11/13/2016   Pseudophakia 11/12/2016   Urinary incontinence in female 07/29/2016   Environmental allergies 07/14/2016   Annual physical exam 04/02/2015   Chronic radicular pain of lower back 03/13/2014   Reduced vision 03/13/2014   Essential hypertension 03/08/2014   Obesity (BMI 30.0-34.9) 02/19/2010   Vitamin D deficiency 07/27/2009   Hyperlipidemia LDL goal <100 02/19/2008   Disorder of bladder 02/19/2008   Ailene Ravel, OTR/L,CBIS  [phone]  10/26/2021, 1:01 PM  [facility] [address] [place], [place], [address] Phone: [phone]   Fax:  [phone]  Name: Yvonne Brewer MRN:  [id number] Date of Birth: 07/15/1946

## 2021-10-30 ENCOUNTER — Ambulatory Visit (HOSPITAL_COMMUNITY): Payer: PPO

## 2021-11-05 ENCOUNTER — Other Ambulatory Visit (HOSPITAL_COMMUNITY): Payer: PPO

## 2021-11-05 ENCOUNTER — Telehealth: Payer: Self-pay

## 2021-11-05 ENCOUNTER — Encounter (HOSPITAL_COMMUNITY): Payer: PPO

## 2021-11-05 NOTE — Telephone Encounter (Signed)
Patient spouse came by the office and dropped off a note (NOTE IN DR SIMPSON BOX) ?I filled the subscription for the statin but somehow lost the bag - checked parking lots, home, car, etc.  The pharmacy says the insurance company want pay for another so $ 70.00 would be the replacement cost.  Do you have any solutions?  Another drug?  Or Samples?  ?Thanks  Rosiland Sen [phone]   [email or web address] ?

## 2021-11-06 NOTE — Telephone Encounter (Signed)
Sent her a Comptroller that we could send #[phone] to [facility] for $14 with good rx coupon  ?

## 2021-11-07 ENCOUNTER — Other Ambulatory Visit: Payer: Self-pay

## 2021-11-07 MED ORDER — ROSUVASTATIN CALCIUM 5 MG PO TABS: ORAL_TABLET | ORAL | 0 refills | Status: DC

## 2021-11-10 ENCOUNTER — Other Ambulatory Visit: Payer: Self-pay | Admitting: Family Medicine

## 2021-11-12 NOTE — Telephone Encounter (Signed)
?  Notes to clinic:  not a provider in this practice ? ? ?  ? ?Requested Prescriptions  ?Pending Prescriptions Disp Refills  ? fluticasone (FLONASE) 50 MCG/ACT nasal spray [Pharmacy Med Name: FLUTICASONE PROP 50 MCG SPRAY] 48 mL   ?  Sig: Place 1 spray into both nostrils 2 (two) times daily.  ?  ? Not Delegated - Ear, Nose, and Throat: Nasal Preparations - Corticosteroids Failed - 11/10/2021  9:26 AM  ?  ?  Failed - This refill cannot be delegated  ?  ?  Failed - Valid encounter within last 12 months  ?  Recent Outpatient Visits   ?None ?  ?  ?Future Appointments   ? ?        ? In 5 months Moshe Cipro Norwood Levo, MD [facility]  ? ?  ? ?  ?  ?  ? cetirizine (ZYRTEC) 10 MG tablet [Pharmacy Med Name: CETIRIZINE HCL 10 MG TABLET] 90 tablet   ?  Sig: TAKE 1 TABLET BY MOUTH EVERY DAY  ?  ? Ear, Nose, and Throat:  Antihistamines 2 Failed - 11/10/2021  9:26 AM  ?  ?  Failed - Valid encounter within last 12 months  ?  Recent Outpatient Visits   ?None ?  ?  ?Future Appointments   ? ?        ? In 5 months Fayrene Helper, MD [facility]  ? ?  ? ?  ?  ?  Passed - Cr in normal range and within 360 days  ?  Creat  ?Date Value Ref Range Status  ?06/15/2020 0.94 (H) 0.60 - 0.93 mg/dL Final  ?  Comment:  ?  For patients >51 years of age, the reference limit ?for Creatinine is approximately 13% higher for people ?identified as African-American. ?. ?  ? ?Creatinine, Ser  ?Date Value Ref Range Status  ?10/19/2021 0.84 0.57 - 1.00 mg/dL Final  ?  ?  ?  ?  ? ? ?

## 2021-11-19 ENCOUNTER — Other Ambulatory Visit: Payer: Self-pay

## 2021-11-19 ENCOUNTER — Ambulatory Visit (INDEPENDENT_AMBULATORY_CARE_PROVIDER_SITE_OTHER): Payer: PPO

## 2021-11-19 DIAGNOSIS — Z0000 Encounter for general adult medical examination without abnormal findings: Secondary | ICD-10-CM

## 2021-11-19 NOTE — Progress Notes (Signed)
? ?Subjective:  ? Yvonne Brewer is a 76 y.o. female who presents for Medicare Annual (Subsequent) preventive examination. ?I connected with  Yvonne Brewer on 11/19/21 by a audio enabled telemedicine application and verified that I am speaking with the correct person using two identifiers. ? ?Patient Location: Home ? ?Provider Location: Office/Clinic ? ?I discussed the limitations of evaluation and management by telemedicine. The patient expressed understanding and agreed to proceed.  ?Review of Systems    ? ?  ? ?   ?Objective:  ?  ?There were no vitals filed for this visit. ?There is no height or weight on file to calculate BMI. ? ? ?  10/26/2021  ? 12:44 PM 05/08/2021  ? 10:32 AM 08/07/2020  ?  1:25 PM 08/01/2020  ?  2:16 PM 08/10/2018  ?  8:30 AM 05/21/2017  ?  9:30 AM 05/16/2016  ?  9:15 AM  ?Advanced Directives  ?Does Patient Have a Medical Advance Directive? No No No No No Yes No  ?Type of Visual merchandiser of [place];Living will   ?Would patient like information on creating a medical advance directive? No - Patient declined No - Patient declined No - Patient declined No - Patient declined Yes (ED - Information included in AVS)    ? ? ?Current Medications (verified) ?Outpatient Encounter Medications as of 11/19/2021  ?Medication Sig  ? amLODipine (NORVASC) 2.5 MG tablet TAKE 1 TABLET BY MOUTH EVERY DAY  ? amLODipine (NORVASC) 5 MG tablet TAKE 1 TABLET BY MOUTH EVERY DAY  ? calcium elemental as carbonate (BARIATRIC TUMS ULTRA) 400 MG chewable tablet Chew 1,000 mg by mouth 3 (three) times daily.  ? rosuvastatin (CRESTOR) 5 MG tablet Take one tablet by mouth every Monday , Wednesday and Friday  ? ?No facility-administered encounter medications on file as of 11/19/2021.  ? ? ?Allergies (verified) ?Ace inhibitors, Penicillins, and Sulfonamide derivatives  ? ?History: ?Past Medical History:  ?Diagnosis Date  ? Essential hypertension 2015  ? Female bladder prolapse   ? History of cystitis   ?  History of skin cancer   ? Hyperlipidemia   ? Obesity   ? ?Past Surgical History:  ?Procedure Laterality Date  ? CATARACT EXTRACTION  2012  ? bilateral   ? COLONOSCOPY  11/06/2011  ? Procedure: COLONOSCOPY;  Surgeon: Rogene Houston, MD;  [facility];  Service: Endoscopy;  Laterality: N/A;  7:30  ? COLONOSCOPY N/A 05/21/2017  ? Procedure: COLONOSCOPY;  Surgeon: Rogene Houston, MD;  [facility];  Service: Endoscopy;  Laterality: N/A;  730-moved to 9/26 '@10'$ :30am per Lelon Frohlich  ? EYE SURGERY  2011  ? bilateral cataract with implants  ? HAMMER TOE SURGERY Left 01/2016  ? Left bunionectomy    ? Squamous cell ca rt ant chest 1989    ? YAG LASER APPLICATION Left 1/32/4401  ? Procedure: YAG LASER APPLICATION;  Surgeon: Rutherford Guys, MD;  Location: [facility];  Service: Ophthalmology;  Laterality: Left;  ? YAG LASER APPLICATION Right 0/27/2536  ? Procedure: YAG LASER APPLICATION;  Surgeon: Rutherford Guys, MD;  Location: [facility];  Service: Ophthalmology;  Laterality: Right;  ? ?Family History  ?Problem Relation Age of Onset  ? Stroke Mother   ? Hypertension Mother   ? Heart disease Father   ? Cancer Maternal Grandmother   ? Anesthesia problems Neg Hx   ? Hypotension Neg Hx   ? Malignant hyperthermia Neg Hx   ? Pseudochol  deficiency Neg Hx   ? ?Social History  ? ?Socioeconomic History  ? Marital status: Married  ?  Spouse name: Edison Nasuti   ? Number of children: 2  ? Years of education: 63  ? Highest education level: Master's degree (e.g., MA, MS, MEng, MEd, MSW, MBA)  ?Occupational History  ? Not on file  ?Tobacco Use  ? Smoking status: Never  ? Smokeless tobacco: Never  ?Vaping Use  ? Vaping Use: Never used  ?Substance and Sexual Activity  ? Alcohol use: Yes  ?  Comment: glass of red wine most nights or scotch and water  ? Drug use: No  ? Sexual activity: Not Currently  ?  Birth control/protection: Post-menopausal  ?Other Topics Concern  ? Not on file  ?Social History Narrative  ? Lives alone with  husband   ? ?Social  Determinants of Health  ? ?Financial Resource Strain: Not on file  ?Food Insecurity: No Food Insecurity  ? Worried About Charity fundraiser in the Last Year: Never true  ? Ran Out of Food in the Last Year: Never true  ?Transportation Needs: No Transportation Needs  ? Lack of Transportation (Medical): No  ? Lack of Transportation (Non-Medical): No  ?Physical Activity: Not on file  ?Stress: Not on file  ?Social Connections: Not on file  ? ? ?Tobacco Counseling ?Counseling given: Not Answered ? ? ?Clinical Intake: ? ?  ? ?  ? ?  ? ?  ? ?  ? ?Diabetic?no ? ?  ? ?  ? ? ?Activities of Daily Living ?   ? View : No data to display.  ?  ?  ?  ? ? ?Patient Care Team: ?Fayrene Helper, MD as PCP - General ?Allyn Kenner, MD (Dermatology) ?Rogene Houston, MD as Consulting Physician (Gastroenterology) ?Sanjuana Kava, MD as Consulting Physician (Orthopedic Surgery) ?Rutherford Guys, MD as Consulting Physician (Ophthalmology) ?Wylene Simmer, MD as Consulting Physician (Orthopedic Surgery) ?Kassie Mends, RN as [place] Management ? ?Indicate any recent Medical Services you may have received from other than [facility] providers in the past year (date may be approximate). ? ?   ?Assessment:  ? This is a routine wellness examination for [facility]. ? ?Hearing/Vision screen ?No results found. ? ?Dietary issues and exercise activities discussed: ?  ? ? Goals Addressed   ?None ?  ? ?Depression Screen ? ?  08/01/2021  ? 10:39 AM 05/08/2021  ? 10:32 AM 05/08/2021  ? 10:14 AM 03/14/2021  ?  3:28 PM 01/12/2021  ?  9:56 AM 08/15/2020  ? 10:54 AM 08/15/2020  ? 10:48 AM  ?PHQ 2/9 Scores  ?PHQ - 2 Score 0 0 0 0 0 0 0  ?  ?Fall Risk ? ?  08/01/2021  ? 10:39 AM 05/08/2021  ? 10:13 AM 03/14/2021  ?  3:28 PM 01/12/2021  ?  9:18 AM 08/15/2020  ? 10:54 AM  ?Fall Risk   ?Falls in the past year? 0 0 0 0 0  ?Number falls in past yr: 0  0 0   ?Injury with Fall? 0  0 0   ?Risk for fall due to : No Fall Risks  No Fall Risks    ?Follow up Falls  evaluation completed  Falls evaluation completed    ? ? ?FALL RISK PREVENTION PERTAINING TO THE HOME: ? ?Any stairs in or around the home? Yes  ?If so, are there any without handrails? No  ?Home free of loose throw rugs in walkways,  pet beds, electrical cords, etc? Yes  ?Adequate lighting in your home to reduce risk of falls? Yes  ? ?ASSISTIVE DEVICES UTILIZED TO PREVENT FALLS: ? ?Life alert? No  ?Use of a cane, walker or w/c? No  ?Grab bars in the bathroom? Yes  ?Shower chair or bench in shower? Yes  ?Elevated toilet seat or a handicapped toilet? No  ? ?TIMED UP AND GO: ? ?Was the test performed? Marland Kitchen  ?Length of time to ambulate 10 feet:  sec.  ? ? ? ?Cognitive Function: ? ?  07/29/2016  ?  9:44 AM  ?MMSE - Mini Mental State Exam  ?Orientation to time 5  ?Orientation to Place 5  ?Registration 3  ?Attention/ Calculation 5  ?Recall 3  ?Language- name 2 objects 2  ?Language- repeat 1  ?Language- follow 3 step command 3  ?Language- read & follow direction 1  ?Write a sentence 1  ?Copy design 1  ?Total score 30  ? ?  ? ?  08/15/2020  ? 10:56 AM 08/12/2019  ?  8:52 AM 08/10/2018  ?  8:37 AM  ?6CIT Screen  ?What Year? 0 points 0 points 0 points  ?What month? 0 points 0 points 0 points  ?What time? 0 points 0 points 0 points  ?Count back from 20 0 points 0 points 0 points  ?Months in reverse 0 points 0 points 0 points  ?Repeat phrase 0 points 0 points 2 points  ?Total Score 0 points 0 points 2 points  ? ? ?Immunizations ?Immunization History  ?Administered Date(s) Administered  ? Fluad Quad(high Dose 65+) 04/19/2019, 06/01/2020  ? Hep A / Hep B 03/09/2018  ? Hepatitis A 12/29/2002, 09/20/2003  ? Influenza Split 05/23/2014  ? Influenza Whole 10/13/2008, 06/06/2009  ? Influenza, High Dose Seasonal PF 05/02/2021  ? Influenza-Unspecified 05/16/2017, 05/26/2018  ? PFIZER(Purple Top)SARS-COV-2 Vaccination 09/16/2019, 10/07/2019, 05/23/2020  ? Pneumococcal Conjugate-13 09/06/2014  ? Pneumococcal Polysaccharide-23 12/31/2010  ? Td  12/15/2002  ? Typhoid Live 03/09/2018  ? Zoster Recombinat (Shingrix) 07/09/2019, 11/18/2019  ? Zoster, Live 09/01/2006  ? ? ?TDAP status: Due, Education has been provided regarding the importance of this vac

## 2021-11-19 NOTE — Patient Instructions (Signed)
?  Ms. Menta , ?Thank you for taking time to come for your Medicare Wellness Visit. I appreciate your ongoing commitment to your health goals. Please review the following plan we discussed and let me know if I can assist you in the future.  ? ?These are the goals we discussed: ? Goals   ? ?  Patient Stated   ?  Patient states that she would like to walk more and lose weight.  ?  ? ?  ?  ?This is a list of the screening recommended for you and due dates:  ?Health Maintenance  ?Topic Date Due  ? COVID-19 Vaccine (4 - Booster for Pfizer series) 07/18/2020  ? Tetanus Vaccine  01/15/2023*  ? Colon Cancer Screening  05/21/2022  ? Pneumonia Vaccine  Completed  ? Flu Shot  Completed  ? DEXA scan (bone density measurement)  Completed  ? Hepatitis C Screening: USPSTF Recommendation to screen - Ages 78-79 yo.  Completed  ? Zoster (Shingles) Vaccine  Completed  ? HPV Vaccine  Aged Out  ?*Topic was postponed. The date shown is not the original due date.  ?  ?

## 2021-12-04 ENCOUNTER — Telehealth: Payer: Self-pay | Admitting: *Deleted

## 2021-12-04 ENCOUNTER — Telehealth: Payer: PPO

## 2021-12-04 NOTE — Telephone Encounter (Signed)
?  Care Management  ? ?Follow Up Note ? ? ?12/04/2021 ?Name: Yvonne Brewer MRN: [id number] DOB: [birth year] ? ? ?Referred by: Fayrene Helper, MD ?Reason for referral : Chronic Care Management (HTN, HLD) ? ? ?An unsuccessful telephone outreach was attempted today. The patient was referred to the case management team for assistance with care management and care coordination.  ? ?Follow Up Plan: Telephone follow up appointment with care management team member scheduled for:  upon care guide rescheduling. ? ?Jacqlyn Larsen RNC, BSN ?RN Case Manager ?[facility] ?[phone] ? ? ?

## 2021-12-20 ENCOUNTER — Telehealth: Payer: Self-pay

## 2021-12-20 NOTE — Telephone Encounter (Signed)
Patient came into the office asked if Dr Moshe Cipro can send into the p[pharmacy the little pills Amlodipine 2.5 mg, its the same dosage as the bigger Amlodipine 5 mg. To have one bottle instead of 2 bottles. ?Please contact patient [phone] ? ?Pharmacy: [facility] ?

## 2021-12-21 ENCOUNTER — Other Ambulatory Visit: Payer: Self-pay

## 2021-12-21 MED ORDER — AMLODIPINE BESYLATE 2.5 MG PO TABS: 2.5000 mg | ORAL_TABLET | Freq: Every day | ORAL | 1 refills | Status: DC

## 2021-12-21 NOTE — Telephone Encounter (Signed)
Refills sent

## 2022-01-08 ENCOUNTER — Encounter: Payer: Self-pay | Admitting: Family Medicine

## 2022-01-08 ENCOUNTER — Ambulatory Visit (INDEPENDENT_AMBULATORY_CARE_PROVIDER_SITE_OTHER): Payer: PPO | Admitting: Family Medicine

## 2022-01-08 ENCOUNTER — Ambulatory Visit (INDEPENDENT_AMBULATORY_CARE_PROVIDER_SITE_OTHER): Payer: PPO | Admitting: *Deleted

## 2022-01-08 VITALS — BP 130/80 | HR 78 | Ht 62.0 in | Wt 193.0 lb

## 2022-01-08 DIAGNOSIS — I10 Essential (primary) hypertension: Secondary | ICD-10-CM | POA: Diagnosis not present

## 2022-01-08 DIAGNOSIS — E785 Hyperlipidemia, unspecified: Secondary | ICD-10-CM

## 2022-01-08 MED ORDER — TRIAMTERENE-HCTZ 37.5-25 MG PO TABS: ORAL_TABLET | ORAL | 1 refills | Status: DC

## 2022-01-08 NOTE — Assessment & Plan Note (Signed)
Controlled but intolerant of amlodipine, start half maxzide daily ?DASH diet and commitment to daily physical activity for a minimum of 30 minutes discussed and encouraged, as a part of hypertension management. ?The importance of attaining a healthy weight is also discussed. ? ? ?  01/08/2022  ?  9:11 AM 01/08/2022  ?  8:42 AM 10/23/2021  ? 11:11 AM 08/15/2021  ? 10:48 AM 03/14/2021  ?  3:26 PM 01/12/2021  ? 10:11 AM 01/12/2021  ?  9:16 AM  ?BP/Weight  ?Systolic BP 122 583 462 194 118 144 125  ?Diastolic BP 80 77 79 73 74 72 72  ?Wt. (Lbs)  193 190  188  188  ?BMI  35.3 kg/m2 34.75 kg/m2  34.39 kg/m2  34.39 kg/m2  ? ? ? ? ?

## 2022-01-08 NOTE — Progress Notes (Signed)
? ?Yvonne Brewer     MRN: [id number]      DOB: June 04, 1946 ? ? ?HPI ?Yvonne Brewer is here for follow up and re-evaluation of chronic medical conditions, medication management and review of any available recent lab and radiology data.  ?Preventive health is updated, specifically  Cancer screening and Immunization.   ?C/o leg swelling with higher dose of amlodipine , and experienced all the bad s/e of crestor ? ?ROS ?Denies recent fever or chills. ?Denies sinus pressure, nasal congestion, ear pain or sore throat.Post nasal drainage with tickle/ cough today ?Denies chest congestion, productive cough or wheezing. ?Denies chest pains, palpitations  ?Has cut back on caffeing howvere expreriences reglux suymptoms at bedtime, uses oTC with good effect.   ?Denies dysuria, frequency, hesitancy or incontinence. ?Denies joint pain, swelling and limitation in mobility. ?Denies headaches, seizures, numbness, or tingling. ?Denies depression, anxiety or insomnia. ?Denies skin break down or rash. ? ? ?PE ? ?BP 130/80   Pulse 78   Ht '5\' 2"'$  (1.575 m)   Wt 193 lb (87.5 kg)   SpO2 95%   BMI 35.30 kg/m?  ? ?Patient alert and oriented and in no cardiopulmonary distress. ? ?HEENT: No facial asymmetry, EOMI,     Neck supple . ? ?Chest: Clear to auscultation bilaterally. ? ?CVS: S1, S2 no murmurs, no S3.Regular rate. ? ?  ? ?Ext: trace  edema ? ?MS: Adequate ROM spine, shoulders, hips and knees. ? ?Skin: Intact, no ulcerations or rash noted. ? ?Psych: Good eye contact, normal affect. Memory intact not anxious or depressed appearing. ? ?CNS: CN 2-12 intact, power,  normal throughout.no focal deficits noted. ? ? ?Assessment & Plan ? ?Essential hypertension ?Controlled but intolerant of amlodipine, start half maxzide daily ?DASH diet and commitment to daily physical activity for a minimum of 30 minutes discussed and encouraged, as a part of hypertension management. ?The importance of attaining a healthy weight is also discussed. ? ? ?   01/08/2022  ?  9:11 AM 01/08/2022  ?  8:42 AM 10/23/2021  ? 11:11 AM 08/15/2021  ? 10:48 AM 03/14/2021  ?  3:26 PM 01/12/2021  ? 10:11 AM 01/12/2021  ?  9:16 AM  ?BP/Weight  ?Systolic BP 017 510 258 527 118 144 125  ?Diastolic BP 80 77 79 73 74 72 72  ?Wt. (Lbs)  193 190  188  188  ?BMI  35.3 kg/m2 34.75 kg/m2  34.39 kg/m2  34.39 kg/m2  ? ? ? ? ? ?Morbid obesity ([place]) ? ?Patient re-educated about  the importance of commitment to a  minimum of 150 minutes of exercise per week as able. ? ?The importance of healthy food choices with portion control discussed, as well as eating regularly and within a 12 hour window most days. ?The need to choose "clean , green" food 50 to 75% of the time is discussed, as well as to make water the primary drink and set a goal of 64 ounces water daily. ? ?  ? ?  01/08/2022  ?  8:42 AM 10/23/2021  ? 11:11 AM 03/14/2021  ?  3:26 PM  ?Weight /BMI  ?Weight 193 lb 190 lb 188 lb  ?Height '5\' 2"'$  (1.575 m) '5\' 2"'$  (1.575 m) '5\' 2"'$  (1.575 m)  ?BMI 35.3 kg/m2 34.75 kg/m2 34.39 kg/m2  ? ? ? ? ?Hyperlipidemia LDL goal <100 ?Hyperlipidemia:Low fat diet discussed and encouraged. ? ? ?Lipid Panel  ?Lab Results  ?Component Value Date  ? CHOL 229 (H) 10/19/2021  ?  HDL 50 10/19/2021  ? LDLCALC 148 (H) 10/19/2021  ? TRIG 170 (H) 10/19/2021  ? CHOLHDL 4.6 (H) 10/19/2021  ? ? ? ?Diet only, statin intolerant ? ? ?

## 2022-01-08 NOTE — Patient Instructions (Signed)
F/U in 8 weeks,, re evaluate blood pressure and weight, call I you need me sooner ? ?NEW for blood pressure is maxzide HALF daily. STOP amlodipine, due to leg swelling ? ?Stop crestor and work on [place]!! ? ?It is important that you exercise regularly at least 30 minutes 5 times a week. If you develop chest pain, have severe difficulty breathing, or feel very tired, stop exercising immediately and seek medical attention  ? ?Continue OTC med as needed for reflux, and cut out caffeine please ? ?Thanks for choosing [facility], we consider it a privelige to serve you. ? ?

## 2022-01-08 NOTE — Chronic Care Management (AMB) (Signed)
?Chronic Care Management  ? ?CCM RN Visit Note ? ?01/08/2022 ?Name: Yvonne Brewer MRN: [id number] DOB: 1946-07-16 ? ?Subjective: ?Yvonne Brewer is a 76 y.o. year old female who is a primary care patient of Fayrene Helper, MD. The care management team was consulted for assistance with disease management and care coordination needs.   ? ?Engaged with patient by telephone for follow up visit in response to provider referral for case management and/or care coordination services.  ? ?Consent to Services:  ?The patient was given information about Chronic Care Management services, agreed to services, and gave verbal consent prior to initiation of services.  Please see initial visit note for detailed documentation.  ? ?Patient agreed to services and verbal consent obtained.  ? ?Assessment: Review of patient past medical history, allergies, medications, health status, including review of consultants reports, laboratory and other test data, was performed as part of comprehensive evaluation and provision of chronic care management services.  ? ?SDOH (Social Determinants of Health) assessments and interventions performed:   ? ?CCM Care Plan ? ?Allergies  ?Allergen Reactions  ? Ace Inhibitors Cough  ?  Cough  ? Amlodipine Swelling  ? Penicillins Hives  ?  Has patient had a PCN reaction causing immediate rash, facial/tongue/throat swelling, SOB or lightheadedness with hypotension: No ?Has patient had a PCN reaction causing severe rash involving mucus membranes or skin necrosis: No ?Has patient had a PCN reaction that required hospitalization No ?Has patient had a PCN reaction occurring within the last 10 years: No ?If all of the above answers are "NO", then may proceed with Cephalosporin use. ?  ? Statins Other (See Comments)  ?  "All sid effects  ? Sulfonamide Derivatives Hives  ? ? ?Outpatient Encounter Medications as of 01/08/2022  ?Medication Sig  ? calcium elemental as carbonate (BARIATRIC TUMS ULTRA) 400 MG  chewable tablet Chew 1,000 mg by mouth 3 (three) times daily.  ? triamterene-hydrochlorothiazide (MAXZIDE-25) 37.5-25 MG tablet Take half tablet by mouth once daily every morning  for blood pressure  ? ?No facility-administered encounter medications on file as of 01/08/2022.  ? ? ?Patient Active Problem List  ? Diagnosis Date Noted  ? Varicose veins of leg with swelling 03/18/2021  ? Left shoulder pain 07/10/2020  ? Anxiety 07/10/2019  ? Heartburn 07/07/2019  ? Educated about COVID-19 virus infection 01/10/2019  ? Fracture, foot, left, sequela 02/09/2018  ? Dyspepsia 04/18/2017  ? Osteoarthritis of both hips 04/18/2017  ? History of colonic polyps 11/13/2016  ? Pseudophakia 11/12/2016  ? Urinary incontinence in female 07/29/2016  ? Environmental allergies 07/14/2016  ? Annual physical exam 04/02/2015  ? Chronic radicular pain of lower back 03/13/2014  ? Reduced vision 03/13/2014  ? Essential hypertension 03/08/2014  ? Morbid obesity ([place]) 02/19/2010  ? Vitamin D deficiency 07/27/2009  ? Hyperlipidemia LDL goal <100 02/19/2008  ? Disorder of bladder 02/19/2008  ? ? ?Conditions to be addressed/monitored:HTN and HLD ? ?Care Plan : RN Care Manager Plan of Care  ?Updates made by Kassie Mends, RN since 01/08/2022 12:00 AM  ?  ? ?Problem: No plan of care established for management of chronic disease states  (HTN, HLD)   ?Priority: High  ?  ? ?Long-Range Goal: Development of plan of care for chronic disease management  (HTN, HLD)   ?Start Date: 09/11/2021  ?Expected End Date: 07/07/2022  ?Priority: High  ?Note:   ?Current Barriers:  ?Knowledge Deficits related to plan of care for management of HTN and HLD  ?  Patient lives with her spouse, is independent in all aspects of her care, continues to drive, exercises daily (water aerobics), does not have HCPOA/ living will in place but reports she is working on this.  Has cane on hand but does not use.  Patient reports she has had some issues with heartburn and is trying to do  better with her diet and decrease carbohydrate intake and possibly try to lose weight. ?Patient reports she was evaluated by OT on 10/26/21 for left shoulder pain and states she did not have to do anything further, the pain went away and has no further issues. ?ASCVD risk enhancing conditions: age >55, HTN ?Does not adhere to provider recommendations re:  does not always adhere to heart healthy diet ?Patient reports she has blood pressure cuff but does not check blood pressure ? ?RNCM Clinical Goal(s):  ?Patient will verbalize understanding of plan for management of HTN and HLD as evidenced by pt report, review of EHR and  through collaboration with RN Care manager, provider, and care team.  ? ?Interventions: ?1:1 collaboration with primary care provider regarding development and update of comprehensive plan of care as evidenced by provider attestation and co-signature ?Inter-disciplinary care team collaboration (see longitudinal plan of care) ?Evaluation of current treatment plan related to  self management and patient's adherence to plan as established by provider ? ? ?Hyperlipidemia:  (Status: Goal on track: NO.) Long Term Goal  ?Lab Results  ?Component Value Date  ? CHOL 253 (H) 01/15/2021  ? HDL 56 01/15/2021  ? LDLCALC 164 (H) 01/15/2021  ? TRIG 182 (H) 01/15/2021  ? CHOLHDL 4.5 (H) 01/15/2021  ?  ? ?Medication review performed; medication list updated in electronic medical record.  ?Provider established cholesterol goals reviewed; ?Reviewed importance of limiting foods high in cholesterol; ?Reviewed exercise goals and target of 150 minutes per week; ?Reviewed heart healthy diet  ?Encouraged patient to continue daily water aerobics ?Reinforced strategies for alleviating heart burn, foods to avoid/ limit ?Discussed plan of care with pt and per pt request will outreach pt quarterly ? ?Hypertension Interventions:  (Status:  Goal on track:  Yes.) Long Term Goal ?Last practice recorded BP readings:  ?BP Readings from  Last 3 Encounters:  ?08/15/21 134/73  ?03/14/21 118/74  ?01/12/21 (!) 144/72  ?Most recent eGFR/CrCl:  ?Lab Results  ?Component Value Date  ? EGFR 66 01/15/2021  ?  No components found for: CRCL ? ?Reviewed medications with patient and discussed importance of compliance ?Discussed plans with patient for ongoing care management follow up and provided patient with direct contact information for care management team ?Reviewed importance of monitoring blood pressure ?Reviewed importance of adherence to low sodium diet ? ? ?Patient Goals/Self-Care Activities: ?Take medications as prescribed   ?Attend all scheduled provider appointments ?Perform all self care activities independently  ?Perform IADL's (shopping, preparing meals, housekeeping, managing finances) independently ?Call provider office for new concerns or questions  ?check blood pressure weekly ?choose a place to take my blood pressure (home, clinic or office, retail store) ?write blood pressure results in a log or diary ?take blood pressure log to all doctor appointments ?keep all doctor appointments ?take medications for blood pressure exactly as prescribed ?eat more whole grains, fruits and vegetables, lean meats and healthy fats ?- call doctor with any symptoms you believe are related to your medicine ?Continue exercising daily (water aerobics), keep up the good work! ?Foods that will make heartburn worse are caffeine, acidic foods (tomatoes, coffee, etc) try to limit/ avoid ?  Keep yourself at a healthy weight ?Follow a heart healthy diet- avoid fried foods, bake or broil your foods ?RN care manager will outreach by telephone quarterly per our conversation ? ? ?  ? ? ?Plan:Telephone follow up appointment with care management team member scheduled for:  04/02/22 ? ?Jacqlyn Larsen RNC, BSN ?RN Case Manager ?[facility] ?[phone] ? ? ? ? ? ? ? ? ? ?

## 2022-01-08 NOTE — Patient Instructions (Signed)
Visit Information ? ?Thank you for taking time to visit with me today. Please don't hesitate to contact me if I can be of assistance to you before our next scheduled telephone appointment. ? ?Following are the goals we discussed today:  ?Take medications as prescribed   ?Attend all scheduled provider appointments ?Perform all self care activities independently  ?Perform IADL's (shopping, preparing meals, housekeeping, managing finances) independently ?Call provider office for new concerns or questions  ?check blood pressure weekly ?choose a place to take my blood pressure (home, clinic or office, retail store) ?write blood pressure results in a log or diary ?take blood pressure log to all doctor appointments ?keep all doctor appointments ?take medications for blood pressure exactly as prescribed ?eat more whole grains, fruits and vegetables, lean meats and healthy fats ?call doctor with any symptoms you believe are related to your medicine ?Continue exercising daily (water aerobics), keep up the good work! ?Foods that will make heartburn worse are caffeine, acidic foods (tomatoes, coffee, etc) try to limit/ avoid ?Keep yourself at a healthy weight ?Follow a heart healthy diet- avoid fried foods, bake or broil your foods ?RN care manager will outreach by telephone quarterly per our conversation ? ?Our next appointment is by telephone on 04/02/22 at 130 pm ? ?Please call the care guide team at [phone] if you need to cancel or reschedule your appointment.  ? ?If you are experiencing a Mental Health or [place] or need someone to talk to, please call the Suicide and Crisis Lifeline: 988 ?call the [place] National Suicide Prevention Lifeline: 267 583 6695 or TTY: 409 691 2754 TTY (740)203-3919) to talk to a trained counselor ?call 1-800-273-TALK (toll free, 24 hour hotline) ?go to [facility] [address], [place] [phone]) ?call the [facility]: [phone] ?call 911  ? ?Patient verbalizes understanding of instructions and care plan provided today and agrees to view in [place]. Active MyChart status confirmed with patient.   ? ?Jacqlyn Larsen RNC, BSN ?RN Case Manager ?[place] ?[phone] ? ?

## 2022-01-08 NOTE — Assessment & Plan Note (Signed)
Hyperlipidemia:Low fat diet discussed and encouraged. ? ? ?Lipid Panel  ?Lab Results  ?Component Value Date  ? CHOL 229 (H) 10/19/2021  ? HDL 50 10/19/2021  ? LDLCALC 148 (H) 10/19/2021  ? TRIG 170 (H) 10/19/2021  ? CHOLHDL 4.6 (H) 10/19/2021  ? ? ? ?Diet only, statin intolerant ?

## 2022-01-08 NOTE — Assessment & Plan Note (Signed)
?  Patient re-educated about  the importance of commitment to a  minimum of 150 minutes of exercise per week as able. ? ?The importance of healthy food choices with portion control discussed, as well as eating regularly and within a 12 hour window most days. ?The need to choose "clean , green" food 50 to 75% of the time is discussed, as well as to make water the primary drink and set a goal of 64 ounces water daily. ? ?  ? ?  01/08/2022  ?  8:42 AM 10/23/2021  ? 11:11 AM 03/14/2021  ?  3:26 PM  ?Weight /BMI  ?Weight 193 lb 190 lb 188 lb  ?Height '5\' 2"'$  (1.575 m) '5\' 2"'$  (1.575 m) '5\' 2"'$  (1.575 m)  ?BMI 35.3 kg/m2 34.75 kg/m2 34.39 kg/m2  ? ? ? ?

## 2022-01-23 DIAGNOSIS — E785 Hyperlipidemia, unspecified: Secondary | ICD-10-CM | POA: Diagnosis not present

## 2022-01-23 DIAGNOSIS — I10 Essential (primary) hypertension: Secondary | ICD-10-CM | POA: Diagnosis not present

## 2022-02-21 ENCOUNTER — Other Ambulatory Visit: Payer: Self-pay | Admitting: Family Medicine

## 2022-02-21 DIAGNOSIS — I10 Essential (primary) hypertension: Secondary | ICD-10-CM

## 2022-03-05 ENCOUNTER — Encounter: Payer: Self-pay | Admitting: Family Medicine

## 2022-03-05 ENCOUNTER — Ambulatory Visit (INDEPENDENT_AMBULATORY_CARE_PROVIDER_SITE_OTHER): Payer: PPO | Admitting: Family Medicine

## 2022-03-05 VITALS — BP 170/100 | HR 79 | Ht 62.0 in | Wt 191.1 lb

## 2022-03-05 DIAGNOSIS — E785 Hyperlipidemia, unspecified: Secondary | ICD-10-CM | POA: Diagnosis not present

## 2022-03-05 DIAGNOSIS — I10 Essential (primary) hypertension: Secondary | ICD-10-CM | POA: Diagnosis not present

## 2022-03-05 DIAGNOSIS — R9431 Abnormal electrocardiogram [ECG] [EKG]: Secondary | ICD-10-CM

## 2022-03-05 DIAGNOSIS — R12 Heartburn: Secondary | ICD-10-CM

## 2022-03-05 DIAGNOSIS — K219 Gastro-esophageal reflux disease without esophagitis: Secondary | ICD-10-CM | POA: Diagnosis not present

## 2022-03-05 MED ORDER — TRIAMTERENE-HCTZ 37.5-25 MG PO TABS: 1.0000 | ORAL_TABLET | Freq: Every day | ORAL | 3 refills | Status: DC

## 2022-03-05 MED ORDER — AMLODIPINE BESYLATE 2.5 MG PO TABS: 2.5000 mg | ORAL_TABLET | Freq: Every day | ORAL | 2 refills | Status: DC

## 2022-03-05 NOTE — Patient Instructions (Addendum)
F/u end August as before, call  if you need me sooner  Blood pressure is high  INCREASE triamterene to oNE tablet once daily every morning, and new additional medication is amlodipine 2.5 mg one dai;ly, bOTH are for blood pressure  INCREASE vegetables and fruit, reduce chesses , shrimp and salt  Fasting lipid, cmp and eGFR also cBC 5 to 7 days before next visit  You will be referred to Dr Sydell Axon re reflux  Nurse please update EKG in office today  I am referring you  to Cardiology for evaluation also  Thanks for choosing [facility], we consider it a privelige to serve you.

## 2022-03-12 ENCOUNTER — Encounter: Payer: Self-pay | Admitting: Family Medicine

## 2022-03-12 ENCOUNTER — Telehealth: Payer: Self-pay | Admitting: Family Medicine

## 2022-03-12 DIAGNOSIS — R9431 Abnormal electrocardiogram [ECG] [EKG]: Secondary | ICD-10-CM | POA: Insufficient documentation

## 2022-03-12 NOTE — Assessment & Plan Note (Signed)
Uncontrolled, inc treiamterene to one daily, and add low dose amlodipine DASH diet and commitment to daily physical activity for a minimum of 30 minutes discussed and encouraged, as a part of hypertension management. The importance of attaining a healthy weight is also discussed.     03/05/2022    3:32 PM 03/05/2022    2:47 PM 01/08/2022    9:11 AM 01/08/2022    8:42 AM 10/23/2021   11:11 AM 08/15/2021   10:48 AM 03/14/2021    3:26 PM  BP/Weight  Systolic BP 023 343 568 616 837 290 211  Diastolic BP 155 80 80 77 79 73 74  Wt. (Lbs)  191.12  193 190  188  BMI  34.96 kg/m2  35.3 kg/m2 34.75 kg/m2  34.39 kg/m2

## 2022-03-12 NOTE — Telephone Encounter (Signed)
Pls call pt and advise her that I recommend resuming crestor 10 mg even 4  days per week , sinxce lipid is high, blood pressure uncontrolled and eKG  'not normal" Please enter isf she agrees and send in if she needs the medication  ??/concerns please send me a message

## 2022-03-12 NOTE — Assessment & Plan Note (Signed)
Chronic and uncontrolled, gI to re eval

## 2022-03-12 NOTE — Assessment & Plan Note (Signed)
  Patient re-educated about  the importance of commitment to a  minimum of 150 minutes of exercise per week as able.  The importance of healthy food choices with portion control discussed, as well as eating regularly and within a 12 hour window most days. The need to choose "clean , green" food 50 to 75% of the time is discussed, as well as to make water the primary drink and set a goal of 64 ounces water daily.       03/05/2022    2:47 PM 01/08/2022    8:42 AM 10/23/2021   11:11 AM  Weight /BMI  Weight 191 lb 1.9 oz 193 lb 190 lb  Height '5\' 2"'$  (1.575 m) '5\' 2"'$  (1.575 m) '5\' 2"'$  (1.575 m)  BMI 34.96 kg/m2 35.3 kg/m2 34.75 kg/m2

## 2022-03-12 NOTE — Assessment & Plan Note (Signed)
Hyperlipidemia:Low fat diet discussed and encouraged.   Lipid Panel  Lab Results  Component Value Date   CHOL 229 (H) 10/19/2021   HDL 50 10/19/2021   LDLCALC 148 (H) 10/19/2021   TRIG 170 (H) 10/19/2021   CHOLHDL 4.6 (H) 10/19/2021   Needs to resume low dose statin and lower fat intake

## 2022-03-12 NOTE — Progress Notes (Signed)
Yvonne Brewer     MRN: [id number]      DOB: 03-16-1946   HPI Yvonne Brewer is here for follow up and re-evaluation of chronic medical conditions, medication management and review of any available recent lab and radiology data.  Preventive health is updated, specifically  Cancer screening and Immunization.   Questions or concerns regarding consultations or procedures which the PT has had in the interim are  addressed. The PT denies any adverse reactions to current medications since the last visit.  C/o substernal chest discomfort, which she takes tums for. Pain is primarily when lying down, denies dysphagia ROS Denies recent fever or chills. Denies sinus pressure, nasal congestion, ear pain or sore throat. Denies chest congestion, productive cough or wheezing. Denies chest pains, palpitations and leg swelling Denies abdominal pain, nausea, vomiting,diarrhea or constipation.   Denies dysuria, frequency, hesitancy or incontinence. Denies joint pain, swelling and limitation in mobility. Denies headaches, seizures, numbness, or tingling. Denies depression, anxiety or insomnia. Denies skin break down or rash.   PE  BP (!) 170/100   Pulse 79   Ht '5\' 2"'$  (1.575 m)   Wt 191 lb 1.9 oz (86.7 kg)   SpO2 93%   BMI 34.96 kg/m   Patient alert and oriented and in no cardiopulmonary distress.  HEENT: No facial asymmetry, EOMI,     Neck supple .  Chest: Clear to auscultation bilaterally.  CVS: S1, S2 no murmurs, no S3.Regular rate. EKG: nSR, rate 80 , non specific t wave abn ABD: Soft non tender.   Ext: No edema  MS: Adequate ROM spine, shoulders, hips and knees.  Skin: Intact, no ulcerations or rash noted.  Psych: Good eye contact, normal affect. Memory intact not anxious or depressed appearing.  CNS: CN 2-12 intact, power,  normal throughout.no focal deficits noted.   Assessment & Plan  Essential hypertension Uncontrolled, inc treiamterene to one daily, and add low dose  amlodipine DASH diet and commitment to daily physical activity for a minimum of 30 minutes discussed and encouraged, as a part of hypertension management. The importance of attaining a healthy weight is also discussed.     03/05/2022    3:32 PM 03/05/2022    2:47 PM 01/08/2022    9:11 AM 01/08/2022    8:42 AM 10/23/2021   11:11 AM 08/15/2021   10:48 AM 03/14/2021    3:26 PM  BP/Weight  Systolic BP 366 440 347 425 956 387 564  Diastolic BP 332 80 80 77 79 73 74  Wt. (Lbs)  191.12  193 190  188  BMI  34.96 kg/m2  35.3 kg/m2 34.75 kg/m2  34.39 kg/m2       Hyperlipidemia LDL goal <100 Hyperlipidemia:Low fat diet discussed and encouraged.   Lipid Panel  Lab Results  Component Value Date   CHOL 229 (H) 10/19/2021   HDL 50 10/19/2021   LDLCALC 148 (H) 10/19/2021   TRIG 170 (H) 10/19/2021   CHOLHDL 4.6 (H) 10/19/2021   Needs to resume low dose statin and lower fat intake    Heartburn Chronic and uncontrolled, gI to re eval  Abnormal EKG A non specific t wave abn, " heartburn" multiple cV risk factore , refer to Cardiology resume statin 3 times weekly if unable to tolerate daily  Morbid obesity ([place])  Patient re-educated about  the importance of commitment to a  minimum of 150 minutes of exercise per week as able.  The importance of healthy food choices with portion control  discussed, as well as eating regularly and within a 12 hour window most days. The need to choose "clean , green" food 50 to 75% of the time is discussed, as well as to make water the primary drink and set a goal of 64 ounces water daily.       03/05/2022    2:47 PM 01/08/2022    8:42 AM 10/23/2021   11:11 AM  Weight /BMI  Weight 191 lb 1.9 oz 193 lb 190 lb  Height '5\' 2"'$  (1.575 m) '5\' 2"'$  (1.575 m) '5\' 2"'$  (1.575 m)  BMI 34.96 kg/m2 35.3 kg/m2 34.75 kg/m2

## 2022-03-12 NOTE — Assessment & Plan Note (Signed)
A non specific t wave abn, " heartburn" multiple cV risk factore , refer to Cardiology resume statin 3 times weekly if unable to tolerate daily

## 2022-03-14 ENCOUNTER — Other Ambulatory Visit: Payer: Self-pay

## 2022-03-14 MED ORDER — ROSUVASTATIN CALCIUM 10 MG PO TABS: 10.0000 mg | ORAL_TABLET | Freq: Every day | ORAL | 2 refills | Status: DC

## 2022-03-14 NOTE — Telephone Encounter (Signed)
Called in for to take daily  but told her she only had to take 4 days per week

## 2022-03-17 ENCOUNTER — Other Ambulatory Visit: Payer: Self-pay | Admitting: Family Medicine

## 2022-04-02 ENCOUNTER — Ambulatory Visit (INDEPENDENT_AMBULATORY_CARE_PROVIDER_SITE_OTHER): Payer: PPO | Admitting: *Deleted

## 2022-04-02 DIAGNOSIS — I10 Essential (primary) hypertension: Secondary | ICD-10-CM

## 2022-04-02 DIAGNOSIS — E785 Hyperlipidemia, unspecified: Secondary | ICD-10-CM

## 2022-04-02 NOTE — Chronic Care Management (AMB) (Signed)
Chronic Care Management   CCM RN Visit Note  04/02/2022 Name: Yvonne Brewer MRN: [id number] DOB: November 15, 1945  Subjective: Yvonne Brewer is a 76 y.o. year old female who is a primary care patient of Fayrene Helper, MD. The care management team was consulted for assistance with disease management and care coordination needs.    Engaged with patient by telephone for follow up visit in response to provider referral for case management and/or care coordination services.   Consent to Services:  The patient was given information about Chronic Care Management services, agreed to services, and gave verbal consent prior to initiation of services.  Please see initial visit note for detailed documentation.   Patient agreed to services and verbal consent obtained.   Assessment: Review of patient past medical history, allergies, medications, health status, including review of consultants reports, laboratory and other test data, was performed as part of comprehensive evaluation and provision of chronic care management services.   SDOH (Social Determinants of Health) assessments and interventions performed:    CCM Care Plan  Allergies  Allergen Reactions   Ace Inhibitors Cough    Cough   Amlodipine Swelling   Penicillins Hives    Has patient had a PCN reaction causing immediate rash, facial/tongue/throat swelling, SOB or lightheadedness with hypotension: No Has patient had a PCN reaction causing severe rash involving mucus membranes or skin necrosis: No Has patient had a PCN reaction that required hospitalization No Has patient had a PCN reaction occurring within the last 10 years: No If all of the above answers are "NO", then may proceed with Cephalosporin use.    Statins Other (See Comments)    "All sid effects   Sulfonamide Derivatives Hives    Outpatient Encounter Medications as of 04/02/2022  Medication Sig   amLODipine (NORVASC) 2.5 MG tablet Take 1 tablet (2.5 mg total) by  mouth daily.   calcium elemental as carbonate (BARIATRIC TUMS ULTRA) 400 MG chewable tablet Chew 1,000 mg by mouth daily. Uses only as needed   rosuvastatin (CRESTOR) 10 MG tablet Take 1 tablet (10 mg total) by mouth daily.   triamterene-hydrochlorothiazide (MAXZIDE-25) 37.5-25 MG tablet Take 1 tablet by mouth daily.   No facility-administered encounter medications on file as of 04/02/2022.    Patient Active Problem List   Diagnosis Date Noted   Abnormal EKG 03/12/2022   Varicose veins of leg with swelling 03/18/2021   Left shoulder pain 07/10/2020   Anxiety 07/10/2019   Heartburn 07/07/2019   Educated about COVID-19 virus infection 01/10/2019   Fracture, foot, left, sequela 02/09/2018   Dyspepsia 04/18/2017   Osteoarthritis of both hips 04/18/2017   History of colonic polyps 11/13/2016   Pseudophakia 11/12/2016   Urinary incontinence in female 07/29/2016   Environmental allergies 07/14/2016   Annual physical exam 04/02/2015   Chronic radicular pain of lower back 03/13/2014   Reduced vision 03/13/2014   Essential hypertension 03/08/2014   Morbid obesity ([place]) 02/19/2010   Vitamin D deficiency 07/27/2009   Hyperlipidemia LDL goal <100 02/19/2008   Disorder of bladder 02/19/2008    Conditions to be addressed/monitored:HTN and HLD  Care Plan : RN Care Manager Plan of Care  Updates made by Kassie Mends, RN since 04/02/2022 12:00 AM  Completed 04/02/2022   Problem: No plan of care established for management of chronic disease states  (HTN, HLD) Resolved 04/02/2022  Priority: High     Long-Range Goal: Development of plan of care for chronic disease management  (HTN, HLD)  Completed 04/02/2022  Start Date: 09/11/2021  Expected End Date: 07/07/2022  Priority: High  Note:   Current Barriers:  Knowledge Deficits related to plan of care for management of HTN and HLD  Patient lives with her spouse, is independent in all aspects of her care, continues to drive, exercises daily (water  aerobics), does not have HCPOA/ living will in place but reports she is working on this.  Has cane on hand but does not use.  Patient reports she has had some issues with heartburn and is trying to do better with her diet and decrease carbohydrate intake and possibly try to lose weight. Patient reports she was evaluated by OT on 10/26/21 for left shoulder pain and states she did not have to do anything further, the pain went away and has no further issues. ASCVD risk enhancing conditions: age >37, HTN Does not adhere to provider recommendations re:  does not always adhere to heart healthy diet Patient reports she has blood pressure cuff but does not check blood pressure 04/02/22- Update- patient reports she is doing well, states she had adjustments (increase) with blood pressure medications, still does not check blood pressure at home, checks at [facility] and doctor's visits, states she will be seeing GI doctor for reflux issues, uses Tums at present with relief obtained.  RNCM Clinical Goal(s):  Patient will verbalize understanding of plan for management of HTN and HLD as evidenced by pt report, review of EHR and  through collaboration with RN Care manager, provider, and care team.   Interventions: 1:1 collaboration with primary care provider regarding development and update of comprehensive plan of care as evidenced by provider attestation and co-signature Inter-disciplinary care team collaboration (see longitudinal plan of care) Evaluation of current treatment plan related to  self management and patient's adherence to plan as established by provider   Hyperlipidemia:  (Status: Goal on track: NO.) Long Term Goal  Lab Results  Component Value Date   CHOL 253 (H) 01/15/2021   HDL 56 01/15/2021   [place] 164 (H) 01/15/2021   TRIG 182 (H) 01/15/2021   CHOLHDL 4.5 (H) 01/15/2021     Medication review performed; medication list updated in electronic medical record.  Provider established cholesterol  goals reviewed; Reviewed role and benefits of statin for ASCVD risk reduction; Reviewed importance of limiting foods high in cholesterol; Reviewed exercise goals and target of 150 minutes per week; Reviewed heart healthy diet  Encouraged patient to continue daily water aerobics Reviewed strategies for alleviating heart burn, foods to avoid/ limit Discussed plan of care with pt including case closure today, patient in agreement and feels no further needs for care management  Hypertension Interventions:  (Status:  Goal on track:  Yes.) Long Term Goal Last practice recorded BP readings:  BP Readings from Last 3 Encounters:  08/15/21 134/73  03/14/21 118/74  01/12/21 (!) 144/72  Most recent eGFR/CrCl:  Lab Results  Component Value Date   EGFR 66 01/15/2021    No components found for: CRCL  Reviewed medications with patient and discussed importance of compliance Discussed plans with patient for ongoing care management follow up and provided patient with direct contact information for care management team Reinforced importance of monitoring blood pressure Encouraged patient to take her blood pressure cuff into next primary care provider appointment to check against blood pressure cuff at office Reinforced importance of adherence to low sodium diet, limiting fast food and reading labels   Patient Goals/Self-Care Activities: Take medications as prescribed   Attend  all scheduled provider appointments Call pharmacy for medication refills 3-7 days in advance of running out of medications Perform all self care activities independently  Perform IADL's (shopping, preparing meals, housekeeping, managing finances) independently Call provider office for new concerns or questions  check blood pressure weekly choose a place to take my blood pressure (home, clinic or office, retail store) write blood pressure results in a log or diary learn about high blood pressure take blood pressure log to all  doctor appointments call doctor for signs and symptoms of high blood pressure keep all doctor appointments take medications for blood pressure exactly as prescribed eat more whole grains, fruits and vegetables, lean meats and healthy fats - call doctor with any symptoms you believe are related to your medicine Continue exercising daily (water aerobics), keep up the good work! Foods that will make heartburn worse are caffeine, acidic foods (tomatoes, coffee, etc) try to limit/ avoid Keep yourself at a healthy weight Follow a heart healthy diet- avoid fried foods, bake or broil your foods RN care manager will close case today per our conversation, please let your doctor know if you have any future care management needs       Plan:No further follow up required: case closure today   Jacqlyn Larsen [facility], BSN RN Case Manager [facility] [phone]

## 2022-04-02 NOTE — Patient Instructions (Signed)
Visit Information  Thank you for taking time to visit with me today. Please don't hesitate to contact me if I can be of assistance to you before our next scheduled telephone appointment.  Following are the goals we discussed today:  Take medications as prescribed   Attend all scheduled provider appointments Call pharmacy for medication refills 3-7 days in advance of running out of medications Perform all self care activities independently  Perform IADL's (shopping, preparing meals, housekeeping, managing finances) independently Call provider office for new concerns or questions  check blood pressure weekly choose a place to take my blood pressure (home, clinic or office, retail store) write blood pressure results in a log or diary learn about high blood pressure take blood pressure log to all doctor appointments call doctor for signs and symptoms of high blood pressure keep all doctor appointments take medications for blood pressure exactly as prescribed eat more whole grains, fruits and vegetables, lean meats and healthy fats - call doctor with any symptoms you believe are related to your medicine Continue exercising daily (water aerobics), keep up the good work! Foods that will make heartburn worse are caffeine, acidic foods (tomatoes, coffee, etc) try to limit/ avoid Keep yourself at a healthy weight Follow a heart healthy diet- avoid fried foods, bake or broil your foods RN care manager will close case today per our conversation, please let your doctor know if you have any future care management needs  Please call the care guide team at [phone] if you need to cancel or reschedule your appointment.   If you are experiencing a Mental Health or [place] or need someone to talk to, please call the Suicide and Crisis Lifeline: 988 call the [place] National Suicide Prevention Lifeline: 445-375-9862 or TTY: 716-571-9325 TTY 3167115320) to talk to a trained  counselor call 1-800-273-TALK (toll free, 24 hour hotline) go to [facility] [address], [place] [phone]) call the [place]: [phone] call 911   Patient verbalizes understanding of instructions and care plan provided today and agrees to view in [place]. Active MyChart status and patient understanding of how to access instructions and care plan via MyChart confirmed with patient.     Jacqlyn Larsen RNC, BSN RN Case Manager [facility] [phone] Managing Your Hypertension Hypertension, also called high blood pressure, is when the force of the blood pressing against the walls of the arteries is too strong. Arteries are blood vessels that carry blood from your heart throughout your body. Hypertension forces the heart to work harder to pump blood and may cause the arteries to become narrow or stiff. Understanding blood pressure readings A blood pressure reading includes a higher number over a lower number: The first, or top, number is called the systolic pressure. It is a measure of the pressure in your arteries as your heart beats. The second, or bottom number, is called the diastolic pressure. It is a measure of the pressure in your arteries as the heart relaxes. For most people, a normal blood pressure is below 120/80. Your personal target blood pressure may vary depending on your medical conditions, your age, and other factors. Blood pressure is classified into four stages. Based on your blood pressure reading, your health care provider may use the following stages to determine what type of treatment you need, if any. Systolic pressure and diastolic pressure are measured in a unit called millimeters of mercury (mmHg). Normal Systolic pressure: below 408. Diastolic pressure: below 80.  Elevated Systolic pressure: 846-962. Diastolic pressure: below 80. Hypertension stage 1 Systolic pressure:  952-841. Diastolic pressure: 32-44. Hypertension stage 2 Systolic pressure: 010 or above. Diastolic pressure: 90 or above. How can this condition affect me? Managing your hypertension is very important. Over time, hypertension can damage the arteries and decrease blood flow to parts of the body, including the brain, heart, and kidneys. Having untreated or uncontrolled hypertension can lead to: A heart attack. A stroke. A weakened blood vessel (aneurysm). Heart failure. Kidney damage. Eye damage. Memory and concentration problems. Vascular dementia. What actions can I take to manage this condition? Hypertension can be managed by making lifestyle changes and possibly by taking medicines. Your health care provider will help you make a plan to bring your blood pressure within a normal range. You may be referred for counseling on a healthy diet and physical activity. Nutrition  Eat a diet that is high in fiber and potassium, and low in salt (sodium), added sugar, and fat. An example eating plan is called the DASH diet. DASH stands for Dietary Approaches to Stop Hypertension. To eat this way: Eat plenty of fresh fruits and vegetables. Try to fill one-half of your plate at each meal with fruits and vegetables. Eat whole grains, such as whole-wheat pasta, brown rice, or whole-grain bread. Fill about one-fourth of your plate with whole grains. Eat low-fat dairy products. Avoid fatty cuts of meat, processed or cured meats, and poultry with skin. Fill about one-fourth of your plate with lean proteins such as fish, chicken without skin, beans, eggs, and tofu. Avoid pre-made and processed foods. These tend to be higher in sodium, added sugar, and fat. Reduce your daily sodium intake. Many people with hypertension should eat less than 1,500 mg of sodium a day. Lifestyle  Work with your health care provider to maintain a healthy body weight or to lose weight. Ask what an ideal weight is for you. Get at  least 30 minutes of exercise that causes your heart to beat faster (aerobic exercise) most days of the week. Activities may include walking, swimming, or biking. Include exercise to strengthen your muscles (resistance exercise), such as weight lifting, as part of your weekly exercise routine. Try to do these types of exercises for 30 minutes at least 3 days a week. Do not use any products that contain nicotine or tobacco. These products include cigarettes, chewing tobacco, and vaping devices, such as e-cigarettes. If you need help quitting, ask your health care provider. Control any long-term (chronic) conditions you have, such as high cholesterol or diabetes. Identify your sources of stress and find ways to manage stress. This may include meditation, deep breathing, or making time for fun activities. Alcohol use Do not drink alcohol if: Your health care provider tells you not to drink. You are pregnant, may be pregnant, or are planning to become pregnant. If you drink alcohol: Limit how much you have to: 0-1 drink a day for women. 0-2 drinks a day for men. Know how much alcohol is in your drink. In the [place] one drink equals one 12 oz bottle of beer (355 mL), one 5 oz glass of wine (148 mL), or one 1 oz glass of hard liquor (44 mL). Medicines Your health care provider may prescribe medicine if lifestyle changes are not enough to get your blood pressure under control and if: Your systolic blood pressure is 130 or higher. Your diastolic blood pressure is 80 or higher. Take medicines only as told by your health care  provider. Follow the directions carefully. Blood pressure medicines must be taken as told by your health care provider. The medicine does not work as well when you skip doses. Skipping doses also puts you at risk for problems. Monitoring Before you monitor your blood pressure: Do not smoke, drink caffeinated beverages, or exercise within 30 minutes before taking a measurement. Use  the bathroom and empty your bladder (urinate). Sit quietly for at least 5 minutes before taking measurements. Monitor your blood pressure at home as told by your health care provider. To do this: Sit with your back straight and supported. Place your feet flat on the floor. Do not cross your legs. Support your arm on a flat surface, such as a table. Make sure your upper arm is at heart level. Each time you measure, take two or three readings one minute apart and record the results. You may also need to have your blood pressure checked regularly by your health care provider. General information Talk with your health care provider about your diet, exercise habits, and other lifestyle factors that may be contributing to hypertension. Review all the medicines you take with your health care provider because there may be side effects or interactions. Keep all follow-up visits. Your health care provider can help you create and adjust your plan for managing your high blood pressure. Where to find more information National Heart, Lung, and Blood Institute: https://wilson-eaton.com/ American Heart Association: www.heart.org Contact a health care provider if: You think you are having a reaction to medicines you have taken. You have repeated (recurrent) headaches. You feel dizzy. You have swelling in your ankles. You have trouble with your vision. Get help right away if: You develop a severe headache or confusion. You have unusual weakness or numbness, or you feel faint. You have severe pain in your chest or abdomen. You vomit repeatedly. You have trouble breathing. These symptoms may be an emergency. Get help right away. Call 911. Do not wait to see if the symptoms will go away. Do not drive yourself to the hospital. Summary Hypertension is when the force of blood pumping through your arteries is too strong. If this condition is not controlled, it may put you at risk for serious complications. Your  personal target blood pressure may vary depending on your medical conditions, your age, and other factors. For most people, a normal blood pressure is less than 120/80. Hypertension is managed by lifestyle changes, medicines, or both. Lifestyle changes to help manage hypertension include losing weight, eating a healthy, low-sodium diet, exercising more, stopping smoking, and limiting alcohol. This information is not intended to replace advice given to you by your health care provider. Make sure you discuss any questions you have with your health care provider. Document Revised: 04/26/2021 Document Reviewed: 04/26/2021 Elsevier Patient Education  [place]. Hypertension, Adult High blood pressure (hypertension) is when the force of blood pumping through the arteries is too strong. The arteries are the blood vessels that carry blood from the heart throughout the body. Hypertension forces the heart to work harder to pump blood and may cause arteries to become narrow or stiff. Untreated or uncontrolled hypertension can lead to a heart attack, heart failure, a stroke, kidney disease, and other problems. A blood pressure reading consists of a higher number over a lower number. Ideally, your blood pressure should be below 120/80. The first ("top") number is called the systolic pressure. It is a measure of the pressure in your arteries as your heart beats. The  second ("bottom") number is called the diastolic pressure. It is a measure of the pressure in your arteries as the heart relaxes. What are the causes? The exact cause of this condition is not known. There are some conditions that result in high blood pressure. What increases the risk? Certain factors may make you more likely to develop high blood pressure. Some of these risk factors are under your control, including: Smoking. Not getting enough exercise or physical activity. Being overweight. Having too much fat, sugar, calories, or salt  (sodium) in your diet. Drinking too much alcohol. Other risk factors include: Having a personal history of heart disease, diabetes, high cholesterol, or kidney disease. Stress. Having a family history of high blood pressure and high cholesterol. Having obstructive sleep apnea. Age. The risk increases with age. What are the signs or symptoms? High blood pressure may not cause symptoms. Very high blood pressure (hypertensive crisis) may cause: Headache. Fast or irregular heartbeats (palpitations). Shortness of breath. Nosebleed. Nausea and vomiting. Vision changes. Severe chest pain, dizziness, and seizures. How is this diagnosed? This condition is diagnosed by measuring your blood pressure while you are seated, with your arm resting on a flat surface, your legs uncrossed, and your feet flat on the floor. The cuff of the blood pressure monitor will be placed directly against the skin of your upper arm at the level of your heart. Blood pressure should be measured at least twice using the same arm. Certain conditions can cause a difference in blood pressure between your right and left arms. If you have a high blood pressure reading during one visit or you have normal blood pressure with other risk factors, you may be asked to: Return on a different day to have your blood pressure checked again. Monitor your blood pressure at home for 1 week or longer. If you are diagnosed with hypertension, you may have other blood or imaging tests to help your health care provider understand your overall risk for other conditions. How is this treated? This condition is treated by making healthy lifestyle changes, such as eating healthy foods, exercising more, and reducing your alcohol intake. You may be referred for counseling on a healthy diet and physical activity. Your health care provider may prescribe medicine if lifestyle changes are not enough to get your blood pressure under control and if: Your  systolic blood pressure is above 130. Your diastolic blood pressure is above 80. Your personal target blood pressure may vary depending on your medical conditions, your age, and other factors. Follow these instructions at home: Eating and drinking  Eat a diet that is high in fiber and potassium, and low in sodium, added sugar, and fat. An example of this eating plan is called the DASH diet. DASH stands for Dietary Approaches to Stop Hypertension. To eat this way: Eat plenty of fresh fruits and vegetables. Try to fill one half of your plate at each meal with fruits and vegetables. Eat whole grains, such as whole-wheat pasta, brown rice, or whole-grain bread. Fill about one fourth of your plate with whole grains. Eat or drink low-fat dairy products, such as skim milk or low-fat yogurt. Avoid fatty cuts of meat, processed or cured meats, and poultry with skin. Fill about one fourth of your plate with lean proteins, such as fish, chicken without skin, beans, eggs, or tofu. Avoid pre-made and processed foods. These tend to be higher in sodium, added sugar, and fat. Reduce your daily sodium intake. Many people with hypertension should eat  less than 1,500 mg of sodium a day. Do not drink alcohol if: Your health care provider tells you not to drink. You are pregnant, may be pregnant, or are planning to become pregnant. If you drink alcohol: Limit how much you have to: 0-1 drink a day for women. 0-2 drinks a day for men. Know how much alcohol is in your drink. In the [place] one drink equals one 12 oz bottle of beer (355 mL), one 5 oz glass of wine (148 mL), or one 1 oz glass of hard liquor (44 mL). Lifestyle  Work with your health care provider to maintain a healthy body weight or to lose weight. Ask what an ideal weight is for you. Get at least 30 minutes of exercise that causes your heart to beat faster (aerobic exercise) most days of the week. Activities may include walking, swimming, or  biking. Include exercise to strengthen your muscles (resistance exercise), such as Pilates or lifting weights, as part of your weekly exercise routine. Try to do these types of exercises for 30 minutes at least 3 days a week. Do not use any products that contain nicotine or tobacco. These products include cigarettes, chewing tobacco, and vaping devices, such as e-cigarettes. If you need help quitting, ask your health care provider. Monitor your blood pressure at home as told by your health care provider. Keep all follow-up visits. This is important. Medicines Take over-the-counter and prescription medicines only as told by your health care provider. Follow directions carefully. Blood pressure medicines must be taken as prescribed. Do not skip doses of blood pressure medicine. Doing this puts you at risk for problems and can make the medicine less effective. Ask your health care provider about side effects or reactions to medicines that you should watch for. Contact a health care provider if you: Think you are having a reaction to a medicine you are taking. Have headaches that keep coming back (recurring). Feel dizzy. Have swelling in your ankles. Have trouble with your vision. Get help right away if you: Develop a severe headache or confusion. Have unusual weakness or numbness. Feel faint. Have severe pain in your chest or abdomen. Vomit repeatedly. Have trouble breathing. These symptoms may be an emergency. Get help right away. Call 911. Do not wait to see if the symptoms will go away. Do not drive yourself to the hospital. Summary Hypertension is when the force of blood pumping through your arteries is too strong. If this condition is not controlled, it may put you at risk for serious complications. Your personal target blood pressure may vary depending on your medical conditions, your age, and other factors. For most people, a normal blood pressure is less than 120/80. Hypertension is  treated with lifestyle changes, medicines, or a combination of both. Lifestyle changes include losing weight, eating a healthy, low-sodium diet, exercising more, and limiting alcohol. This information is not intended to replace advice given to you by your health care provider. Make sure you discuss any questions you have with your health care provider. Document Revised: 06/19/2021 Document Reviewed: 06/19/2021 Elsevier Patient Education  [place].

## 2022-04-17 DIAGNOSIS — I10 Essential (primary) hypertension: Secondary | ICD-10-CM | POA: Diagnosis not present

## 2022-04-17 DIAGNOSIS — Z131 Encounter for screening for diabetes mellitus: Secondary | ICD-10-CM | POA: Diagnosis not present

## 2022-04-17 DIAGNOSIS — E785 Hyperlipidemia, unspecified: Secondary | ICD-10-CM | POA: Diagnosis not present

## 2022-04-18 LAB — CMP14+EGFR
ALT: 13 [IU]/L (ref 0–32)
AST: 17 [IU]/L (ref 0–40)
Albumin/Globulin Ratio: 1.8 (ref 1.2–2.2)
Albumin: 4.5 g/dL (ref 3.8–4.8)
Alkaline Phosphatase: 59 [IU]/L (ref 44–121)
BUN/Creatinine Ratio: 20 (ref 12–28)
BUN: 21 mg/dL (ref 8–27)
Bilirubin Total: 0.5 mg/dL (ref 0.0–1.2)
CO2: 25 mmol/L (ref 20–29)
Calcium: 10.2 mg/dL (ref 8.7–10.3)
Chloride: 101 mmol/L (ref 96–106)
Creatinine, Ser: 1.03 mg/dL — ABNORMAL HIGH (ref 0.57–1.00)
Globulin, Total: 2.5 g/dL (ref 1.5–4.5)
Glucose: 116 mg/dL — ABNORMAL HIGH (ref 70–99)
Potassium: 5 mmol/L (ref 3.5–5.2)
Sodium: 142 mmol/L (ref 134–144)
Total Protein: 7 g/dL (ref 6.0–8.5)
eGFR: 56 mL/min/{1.73_m2} — ABNORMAL LOW

## 2022-04-18 LAB — LIPID PANEL
Chol/HDL Ratio: 2.4 ratio (ref 0.0–4.4)
Cholesterol, Total: 137 mg/dL (ref 100–199)
HDL: 57 mg/dL (ref >39)
LDL Chol Calc (NIH): 62 mg/dL (ref 0–99)
Triglycerides: 95 mg/dL (ref 0–149)
VLDL Cholesterol Cal: 18 mg/dL (ref 5–40)

## 2022-04-18 LAB — CBC
Hematocrit: 43 % (ref 34.0–46.6)
Hemoglobin: 14 g/dL (ref 11.1–15.9)
MCH: 30.8 pg (ref 26.6–33.0)
MCHC: 32.6 g/dL (ref 31.5–35.7)
MCV: 95 fL (ref 79–97)
Platelets: 316 10*3/uL (ref 150–450)
RBC: 4.55 x10E6/uL (ref 3.77–5.28)
RDW: 12.2 % (ref 11.7–15.4)
WBC: 6.3 10*3/uL (ref 3.4–10.8)

## 2022-04-19 ENCOUNTER — Other Ambulatory Visit: Payer: Self-pay

## 2022-04-19 DIAGNOSIS — Z131 Encounter for screening for diabetes mellitus: Secondary | ICD-10-CM

## 2022-04-19 DIAGNOSIS — Z23 Encounter for immunization: Secondary | ICD-10-CM

## 2022-04-20 ENCOUNTER — Other Ambulatory Visit: Payer: Self-pay | Admitting: Family Medicine

## 2022-04-23 LAB — HEMOGLOBIN A1C
Est. average glucose Bld gHb Est-mCnc: 120 mg/dL
Hgb A1c MFr Bld: 5.8 % — ABNORMAL HIGH (ref 4.8–5.6)

## 2022-04-23 LAB — SPECIMEN STATUS REPORT

## 2022-04-24 ENCOUNTER — Ambulatory Visit (INDEPENDENT_AMBULATORY_CARE_PROVIDER_SITE_OTHER): Payer: PPO | Admitting: Family Medicine

## 2022-04-24 ENCOUNTER — Encounter: Payer: Self-pay | Admitting: Family Medicine

## 2022-04-24 VITALS — BP 135/83 | HR 84 | Ht 61.0 in | Wt 191.0 lb

## 2022-04-24 DIAGNOSIS — E785 Hyperlipidemia, unspecified: Secondary | ICD-10-CM

## 2022-04-24 DIAGNOSIS — Z1231 Encounter for screening mammogram for malignant neoplasm of breast: Secondary | ICD-10-CM | POA: Diagnosis not present

## 2022-04-24 DIAGNOSIS — E559 Vitamin D deficiency, unspecified: Secondary | ICD-10-CM | POA: Diagnosis not present

## 2022-04-24 DIAGNOSIS — I10 Essential (primary) hypertension: Secondary | ICD-10-CM

## 2022-04-24 DIAGNOSIS — R7302 Impaired glucose tolerance (oral): Secondary | ICD-10-CM | POA: Diagnosis not present

## 2022-04-24 NOTE — Assessment & Plan Note (Signed)
  Patient re-educated about  the importance of commitment to a  minimum of 150 minutes of exercise per week as able.  The importance of healthy food choices with portion control discussed, as well as eating regularly and within a 12 hour window most days. The need to choose "clean , green" food 50 to 75% of the time is discussed, as well as to make water the primary drink and set a goal of 64 ounces water daily.       04/24/2022   10:55 AM 03/05/2022    2:47 PM 01/08/2022    8:42 AM  Weight /BMI  Weight 191 lb 191 lb 1.9 oz 193 lb  Height '5\' 1"'$  (1.549 m) '5\' 2"'$  (1.575 m) '5\' 2"'$  (1.575 m)  BMI 36.09 kg/m2 34.96 kg/m2 35.3 kg/m2

## 2022-04-24 NOTE — Assessment & Plan Note (Signed)
Controlled, no change in medication Hyperlipidemia:Low fat diet discussed and encouraged.   Lipid Panel  Lab Results  Component Value Date   CHOL 137 04/17/2022   HDL 57 04/17/2022   LDLCALC 62 04/17/2022   TRIG 95 04/17/2022   CHOLHDL 2.4 04/17/2022

## 2022-04-24 NOTE — Progress Notes (Signed)
Yvonne Brewer     MRN: [id number]      DOB: April 08, 1946   HPI Ms. Subramanian is here for follow up and re-evaluation of chronic medical conditions, medication management and review of any available recent lab and radiology data.  Preventive health is updated, specifically  Cancer screening and Immunization.   Questions or concerns regarding consultations or procedures which the PT has had in the interim are  addressed. The PT denies any adverse reactions to current medications since the last visit.  Improved GERD with symptoms   ROS Denies recent fever or chills. Denies sinus pressure, nasal congestion, ear pain or sore throat. Denies chest congestion, productive cough or wheezing. Denies chest pains, palpitations and leg swelling  Denies dysuria, frequency, hesitancy or incontinence. Denies joint pain, swelling and limitation in mobility. Denies headaches, seizures, numbness, or tingling. Denies depression, anxiety or insomnia. Denies skin break down or rash.   PE  BP 135/83 (BP Location: Left Arm, Patient Position: Sitting, Cuff Size: Normal)   Pulse 84   Ht '5\' 1"'$  (1.549 m)   Wt 191 lb (86.6 kg)   SpO2 92%   BMI 36.09 kg/m   Patient alert and oriented and in no cardiopulmonary distress.  HEENT: No facial asymmetry, EOMI,     Neck supple .  Chest: Clear to auscultation bilaterally.  CVS: S1, S2 no murmurs, no S3.Regular rate.   Ext: No edema  MS: Adequate ROM spine, shoulders, hips and knees.  Marland Kitchen  Psych: Good eye contact, normal affect. Memory intact not anxious or depressed appearing.  CNS: CN 2-12 intact, power,  normal throughout.no focal deficits noted.   Assessment & Plan  Essential hypertension Controlled, no change in medication DASH diet and commitment to daily physical activity for a minimum of 30 minutes discussed and encouraged, as a part of hypertension management. The importance of attaining a healthy weight is also discussed.     04/24/2022    10:55 AM 03/05/2022    3:32 PM 03/05/2022    2:47 PM 01/08/2022    9:11 AM 01/08/2022    8:42 AM 10/23/2021   11:11 AM 08/15/2021   10:48 AM  BP/Weight  Systolic BP 379 024 097 353 299 242 683  Diastolic BP 83 419 80 80 77 79 73  Wt. (Lbs) 191  191.12  193 190   BMI 36.09 kg/m2  34.96 kg/m2  35.3 kg/m2 34.75 kg/m2        Morbid obesity (HCC)  Patient re-educated about  the importance of commitment to a  minimum of 150 minutes of exercise per week as able.  The importance of healthy food choices with portion control discussed, as well as eating regularly and within a 12 hour window most days. The need to choose "clean , green" food 50 to 75% of the time is discussed, as well as to make water the primary drink and set a goal of 64 ounces water daily.       04/24/2022   10:55 AM 03/05/2022    2:47 PM 01/08/2022    8:42 AM  Weight /BMI  Weight 191 lb 191 lb 1.9 oz 193 lb  Height '5\' 1"'$  (1.549 m) '5\' 2"'$  (1.575 m) '5\' 2"'$  (1.575 m)  BMI 36.09 kg/m2 34.96 kg/m2 35.3 kg/m2      Hyperlipidemia LDL goal <100 Controlled, no change in medication Hyperlipidemia:Low fat diet discussed and encouraged.   Lipid Panel  Lab Results  Component Value Date   CHOL 137 04/17/2022  HDL 57 04/17/2022   LDLCALC 62 04/17/2022   TRIG 95 04/17/2022   CHOLHDL 2.4 04/17/2022       IGT (impaired glucose tolerance) Patient educated about the importance of limiting  Carbohydrate intake , the need to commit to daily physical activity for a minimum of 30 minutes , and to commit weight loss. The fact that changes in all these areas will reduce or eliminate all together the development of diabetes is stressed.      Latest Ref Rng & Units 04/17/2022    8:12 AM 10/19/2021    8:06 AM 01/15/2021    7:47 AM 06/15/2020    8:34 AM 07/06/2019    7:38 AM  Diabetic Labs  HbA1c 4.8 - 5.6 % 5.8  5.6   5.4  5.3   Chol 100 - 199 mg/dL 137  229  253  226  224   HDL >39 mg/dL 57  50  56  53  49   Calc LDL 0 -  99 mg/dL 62  148  164  146  143   Triglycerides 0 - 149 mg/dL 95  170  182  148  185   Creatinine 0.57 - 1.00 mg/dL 1.03  0.84  0.91  0.94  0.89       04/24/2022   10:55 AM 03/05/2022    3:32 PM 03/05/2022    2:47 PM 01/08/2022    9:11 AM 01/08/2022    8:42 AM 10/23/2021   11:11 AM 08/15/2021   10:48 AM  BP/Weight  Systolic BP 829 937 169 678 938 101 751  Diastolic BP 83 025 80 80 77 79 73  Wt. (Lbs) 191  191.12  193 190   BMI 36.09 kg/m2  34.96 kg/m2  35.3 kg/m2 34.75 kg/m2        No data to display          Updated lab needed at/ before next visit.

## 2022-04-24 NOTE — Assessment & Plan Note (Signed)
Patient educated about the importance of limiting  Carbohydrate intake , the need to commit to daily physical activity for a minimum of 30 minutes , and to commit weight loss. The fact that changes in all these areas will reduce or eliminate all together the development of diabetes is stressed.      Latest Ref Rng & Units 04/17/2022    8:12 AM 10/19/2021    8:06 AM 01/15/2021    7:47 AM 06/15/2020    8:34 AM 07/06/2019    7:38 AM  Diabetic Labs  HbA1c 4.8 - 5.6 % 5.8  5.6   5.4  5.3   Chol 100 - 199 mg/dL 137  229  253  226  224   HDL >39 mg/dL 57  50  56  53  49   Calc LDL 0 - 99 mg/dL 62  148  164  146  143   Triglycerides 0 - 149 mg/dL 95  170  182  148  185   Creatinine 0.57 - 1.00 mg/dL 1.03  0.84  0.91  0.94  0.89       04/24/2022   10:55 AM 03/05/2022    3:32 PM 03/05/2022    2:47 PM 01/08/2022    9:11 AM 01/08/2022    8:42 AM 10/23/2021   11:11 AM 08/15/2021   10:48 AM  BP/Weight  Systolic BP 863 817 711 657 903 833 383  Diastolic BP 83 291 80 80 77 79 73  Wt. (Lbs) 191  191.12  193 190   BMI 36.09 kg/m2  34.96 kg/m2  35.3 kg/m2 34.75 kg/m2        No data to display          Updated lab needed at/ before next visit.

## 2022-04-24 NOTE — Assessment & Plan Note (Signed)
Controlled, no change in medication DASH diet and commitment to daily physical activity for a minimum of 30 minutes discussed and encouraged, as a part of hypertension management. The importance of attaining a healthy weight is also discussed.     04/24/2022   10:55 AM 03/05/2022    3:32 PM 03/05/2022    2:47 PM 01/08/2022    9:11 AM 01/08/2022    8:42 AM 10/23/2021   11:11 AM 08/15/2021   10:48 AM  BP/Weight  Systolic BP 094 709 628 366 294 765 465  Diastolic BP 83 035 80 80 77 79 73  Wt. (Lbs) 191  191.12  193 190   BMI 36.09 kg/m2  34.96 kg/m2  35.3 kg/m2 34.75 kg/m2

## 2022-04-24 NOTE — Patient Instructions (Addendum)
Annual exam March 1 or after, call if you need me sooner  Excellent blood pressure  Work on food choice,blood sugar above norma  Fasting lipid, cmp and EGFr, HBA1c, tSH and vit D 3 to 6 days before visit  It is important that you exercise regularly at least 30 minutes 5 times a week. If you develop chest pain, have severe difficulty breathing, or feel very tired, stop exercising immediately and seek medical attention    Mammogram to be scheduled for 2/29 or after  Thanks for choosing [facility], we consider it a privelige to serve you.

## 2022-04-25 DIAGNOSIS — I10 Essential (primary) hypertension: Secondary | ICD-10-CM | POA: Diagnosis not present

## 2022-04-25 DIAGNOSIS — E785 Hyperlipidemia, unspecified: Secondary | ICD-10-CM

## 2022-05-09 ENCOUNTER — Telehealth: Payer: Self-pay

## 2022-05-09 DIAGNOSIS — R32 Unspecified urinary incontinence: Secondary | ICD-10-CM | POA: Diagnosis not present

## 2022-05-09 DIAGNOSIS — E785 Hyperlipidemia, unspecified: Secondary | ICD-10-CM | POA: Diagnosis not present

## 2022-05-09 DIAGNOSIS — K219 Gastro-esophageal reflux disease without esophagitis: Secondary | ICD-10-CM | POA: Diagnosis not present

## 2022-05-09 DIAGNOSIS — I10 Essential (primary) hypertension: Secondary | ICD-10-CM | POA: Diagnosis not present

## 2022-05-09 DIAGNOSIS — E669 Obesity, unspecified: Secondary | ICD-10-CM | POA: Diagnosis not present

## 2022-05-09 DIAGNOSIS — I8393 Asymptomatic varicose veins of bilateral lower extremities: Secondary | ICD-10-CM | POA: Diagnosis not present

## 2022-05-09 NOTE — Telephone Encounter (Signed)
LMTRC

## 2022-05-09 NOTE — Telephone Encounter (Signed)
Patient called needs to know when last colonoscopy was done.

## 2022-05-10 NOTE — Telephone Encounter (Signed)
Lvm with last date and msg to call back if she would like referral placed

## 2022-06-04 ENCOUNTER — Other Ambulatory Visit: Payer: Self-pay | Admitting: Family Medicine

## 2022-06-04 ENCOUNTER — Encounter: Payer: Self-pay | Admitting: Cardiology

## 2022-06-04 ENCOUNTER — Ambulatory Visit (INDEPENDENT_AMBULATORY_CARE_PROVIDER_SITE_OTHER): Payer: PPO | Admitting: Cardiology

## 2022-06-04 ENCOUNTER — Other Ambulatory Visit
Admission: RE | Admit: 2022-06-04 | Discharge: 2022-06-04 | Disposition: A | Discharge status: Home / Self Care | Payer: PPO | Source: Ambulatory Visit | Attending: Cardiology | Admitting: Cardiology

## 2022-06-04 VITALS — BP 122/76 | HR 88 | Wt 193.0 lb

## 2022-06-04 DIAGNOSIS — Z01818 Encounter for other preprocedural examination: Secondary | ICD-10-CM | POA: Insufficient documentation

## 2022-06-04 DIAGNOSIS — R079 Chest pain, unspecified: Secondary | ICD-10-CM | POA: Insufficient documentation

## 2022-06-04 LAB — BASIC METABOLIC PANEL
Anion gap: 8 (ref 5–15)
BUN: 19 mg/dL (ref 8–23)
CO2: 26 mmol/L (ref 22–32)
Calcium: 9.6 mg/dL (ref 8.9–10.3)
Chloride: 104 mmol/L (ref 98–111)
Creatinine, Ser: 0.98 mg/dL (ref 0.44–1.00)
GFR, Estimated: 60 mL/min — ABNORMAL LOW (ref >60)
Glucose, Bld: 122 mg/dL — ABNORMAL HIGH (ref 70–99)
Potassium: 3.5 mmol/L (ref 3.5–5.1)
Sodium: 138 mmol/L (ref 135–145)

## 2022-06-04 MED ORDER — ROSUVASTATIN CALCIUM 10 MG PO TABS: 10.0000 mg | ORAL_TABLET | Freq: Every day | ORAL | 3 refills | Status: DC

## 2022-06-04 MED ORDER — METOPROLOL TARTRATE 100 MG PO TABS: ORAL_TABLET | ORAL | 0 refills | Status: DC

## 2022-06-04 NOTE — Patient Instructions (Addendum)
   Your cardiac CT will be scheduled at one of the below locations:   [facility] [address] [place], [place] [address] (336) [place] [address] [place], [place] [address] [phone]  [facility] [place], [place] [address] [phone]  If scheduled at [facility], please arrive at the [facility] and Children's Entrance (Entrance C2) of [facility] 30 minutes prior to test start time. You can use the FREE valet parking offered at entrance C (encouraged to control the heart rate for the test)  Proceed to the [facility] Radiology Department (first floor) to check-in and test prep.  All radiology patients and guests should use entrance C2 at [facility], accessed from [facility], even though the hospital's physical address listed is [address]    If scheduled at [facility] or [facility], please arrive 15 mins early for check-in and test prep.   Please follow these instructions carefully (unless otherwise directed):   On the Night Before the Test: Be sure to Drink plenty of water. Do not consume any caffeinated/decaffeinated beverages or chocolate 12 hours prior to your test. Do not take any antihistamines 12 hours prior to your test.  On the Day of the Test: Drink plenty of water until 1 hour prior to the test. Do not eat any food 1 hour prior to test. You may take your regular medications prior to the test.  Take metoprolol (Lopressor) two hours prior to test. HOLD Furosemide/Hydrochlorothiazide morning of the test. FEMALES- please wear underwire-free bra if available, avoid dresses & tight clothing       After the Test: Drink plenty of water. After receiving IV contrast, you may experience a mild flushed feeling. This is normal. On occasion,  you may experience a mild rash up to 24 hours after the test. This is not dangerous. If this occurs, you can take Benadryl 25 mg and increase your fluid intake. If you experience trouble breathing, this can be serious. If it is severe call 911 IMMEDIATELY. If it is mild, please call our office. If you take any of these medications: Glipizide/Metformin, Avandament, Glucavance, please do not take 48 hours after completing test unless otherwise instructed.  We will call to schedule your test 2-4 weeks out understanding that some insurance companies will need an authorization prior to the service being performed.   For non-scheduling related questions, please contact the cardiac imaging nurse navigator should you have any questions/concerns: Marchia Bond, Cardiac Imaging Nurse Navigator Gordy Clement, Cardiac Imaging Nurse Navigator [facility] Direct Office Dial: [phone]   For scheduling needs, including cancellations and rescheduling, please call [place], [phone].        Get BMET lab today   Re-Start Crestor 10 mg daily at dinner or bedtime    Follow up as needed.

## 2022-06-04 NOTE — Progress Notes (Signed)
Cardiology Office Note:    Date:  06/04/2022   ID:  Yvonne, Brewer 19-Oct-1945, MRN [id number]  PCP:  Yvonne Helper, MD   [facility] Providers Cardiologist:  None     Referring MD: Yvonne Helper, MD    History of Present Illness:    Yvonne Brewer is a 76 y.o. female here for evaluation of chest pain at the request of Dr. Moshe Cipro.   In review of Dr. Griffin Dakin note from July 2023, she was complaining of heartburn type symptoms.  She had an EKG performed which demonstrates sinus rhythm with nonspecific ST-T wave changes.  She was referred to cardiology for further evaluation.  She has not had any prior heart attacks strokes.  Non-smoker.  Nondiabetic.  She has been seeing Dr. Moshe Cipro for hypertension which today is under very good control.    Crestor 10 mg was on her list but she states that she was not taking it.  She was not quite sure why.  Previously seen by Dr. Domenic Polite 2018.  Prior visit reviewed.  Past Medical History:  Diagnosis Date   Essential hypertension 2015   Female bladder prolapse    History of cystitis    History of skin cancer    Hyperlipidemia    Obesity     Past Surgical History:  Procedure Laterality Date   CATARACT EXTRACTION  2012   bilateral    COLONOSCOPY  11/06/2011   Procedure: COLONOSCOPY;  Surgeon: Rogene Houston, MD;  Location: [facility];  Service: Endoscopy;  Laterality: N/A;  7:30   COLONOSCOPY N/A 05/21/2017   Procedure: COLONOSCOPY;  Surgeon: Rogene Houston, MD;  Location: [facility];  Service: Endoscopy;  Laterality: N/A;  730-moved to 9/26 '@10'$ :30am per [facility]   EYE SURGERY  2011   bilateral cataract with implants   HAMMER TOE SURGERY Left 01/2016   Left bunionectomy     Squamous cell ca rt ant chest 1989     YAG LASER APPLICATION Left 1/66/0630   Procedure: YAG LASER APPLICATION;  Surgeon: Rutherford Guys, MD;  Location: [facility] ORS;  Service: Ophthalmology;  Laterality: Left;   YAG LASER APPLICATION Right  1/60/1093   Procedure: YAG LASER APPLICATION;  Surgeon: Rutherford Guys, MD;  Location: [facility] ORS;  Service: Ophthalmology;  Laterality: Right;    Current Medications: Current Meds  Medication Sig   amLODipine (NORVASC) 2.5 MG tablet TAKE 1 TABLET BY MOUTH EVERY DAY   calcium elemental as carbonate (BARIATRIC TUMS ULTRA) 400 MG chewable tablet Chew 1,000 mg by mouth daily. Uses only as needed   metoprolol tartrate (LOPRESSOR) 100 MG tablet Take 1 tablet (100 mg) 2 hours before CT   triamterene-hydrochlorothiazide (MAXZIDE-25) 37.5-25 MG tablet TAKE 1 TABLET BY MOUTH EVERY DAY     Allergies:   Ace inhibitors, Amlodipine, Penicillins, Statins, and Sulfonamide derivatives   Social History   Socioeconomic History   Marital status: Married    Spouse name: Edison Nasuti    Number of children: 2   Years of education: 16   Highest education level: Master's degree (e.g., MA, MS, MEng, MEd, MSW, MBA)  Occupational History   Not on file  Tobacco Use   Smoking status: Never   Smokeless tobacco: Never  Vaping Use   Vaping Use: Never used  Substance and Sexual Activity   Alcohol use: Yes    Comment: glass of red wine most nights or scotch and water   Drug use: No   Sexual activity: Not  Currently    Birth control/protection: Post-menopausal  Other Topics Concern   Not on file  Social History Narrative   Lives alone with  husband    Social Determinants of Health   Financial Resource Strain: Low Risk  (08/15/2020)   Overall Financial Resource Strain (CARDIA)    Difficulty of Paying Living Expenses: Not hard at all  Food Insecurity: No Food Insecurity (05/08/2021)   Hunger Vital Sign    Worried About Running Out of Food in the Last Year: Never true    Ran Out of Food in the Last Year: Never true  Transportation Needs: No Transportation Needs (05/08/2021)   PRAPARE - Hydrologist (Medical): No    Lack of Transportation (Non-Medical): No  Physical Activity: Sufficiently  Active (08/15/2020)   Exercise Vital Sign    Days of Exercise per Week: 5 days    Minutes of Exercise per Session: 60 min  Stress: Stress Concern Present (08/15/2020)   [place]    Feeling of Stress : To some extent  Social Connections: Socially Integrated (08/15/2020)   Social Connection and Isolation Panel [NHANES]    Frequency of Communication with Friends and Family: Twice a week    Frequency of Social Gatherings with Friends and Family: Once a week    Attends Religious Services: More than 4 times per year    Active Member of [employer] or Organizations: Yes    Attends Archivist Meetings: Never    Marital Status: Married     Family History: The patient's family history includes Cancer in her maternal grandmother; Heart disease in her father; Hypertension in her mother; Stroke in her mother. There is no history of Anesthesia problems, Hypotension, Malignant hyperthermia, or Pseudochol deficiency.  ROS:   Please see the history of present illness.     All other systems reviewed and are negative.  EKGs/Labs/Other Studies Reviewed:    The following studies were reviewed today: Echo 2018:  - Left ventricle: The cavity size was normal. Wall thickness was    increased in a pattern of mild LVH. Systolic function was normal.    The estimated ejection fraction was in the range of 55% to 60%.    Wall motion was normal; there were no regional wall motion    abnormalities. Left ventricular diastolic function parameters    were normal for the patient&'s age.  - Aortic valve: Mildly calcified annulus. Trileaflet.  - Right atrium: Central venous pressure (est): 3 mm Hg.  - Tricuspid valve: There was trivial regurgitation.  - Pulmonary arteries: Systolic pressure could not be accurately    estimated.  - Pericardium, extracardiac: There was no pericardial effusion.   Impressions:   - Mild LVH with LVEF 55-60% and  grossly normal diastolic function.    Mildly calcified aortic annulus. Trivial tricuspid regurgitation.   EKG:  Prior 02/2022 - NSR 80 with NSSTW changes Recent Labs: 04/17/2022: ALT 13; BUN 21; Creatinine, Ser 1.03; Hemoglobin 14.0; Platelets 316; Potassium 5.0; Sodium 142  Recent Lipid Panel    Component Value Date/Time   CHOL 137 04/17/2022 0812   TRIG 95 04/17/2022 0812   HDL 57 04/17/2022 0812   CHOLHDL 2.4 04/17/2022 0812   CHOLHDL 4.3 06/15/2020 0834   VLDL 26 04/08/2017 0727   LDLCALC 62 04/17/2022 0812   LDLCALC 146 (H) 06/15/2020 0834     Risk Assessment/Calculations:  Physical Exam:    VS:  BP 122/76   Pulse 88   Wt 193 lb (87.5 kg)   SpO2 97%   BMI 36.47 kg/m     Wt Readings from Last 3 Encounters:  06/04/22 193 lb (87.5 kg)  04/24/22 191 lb (86.6 kg)  03/05/22 191 lb 1.9 oz (86.7 kg)     GEN:  Well nourished, well developed in no acute distress HEENT: Normal NECK: No JVD; No carotid bruits LYMPHATICS: No lymphadenopathy CARDIAC: RRR, no murmurs, no rubs, gallops RESPIRATORY:  Clear to auscultation without rales, wheezing or rhonchi  ABDOMEN: Soft, non-tender, non-distended MUSCULOSKELETAL:  No edema; No deformity  SKIN: Warm and dry NEUROLOGIC:  Alert and oriented x 3 PSYCHIATRIC:  Normal affect   ASSESSMENT:    1. Chest pain, unspecified type   2. Pre-op testing    PLAN:    In order of problems listed above:  Chest pain/abnormal EKG/nonspecific ST-T wave changes - Previously described as heartburn.  She has tried to eliminate tomatoes.  She usually demonstrates discomfort mostly at night.  Waxes and wanes.  She tried to eliminate coffee but this was challenging for her.  We will go ahead and proceed with coronary CT scan with possible FFR analysis to ensure that she does not have any underlying coronary artery disease.  Hyperlipidemia  - not taking Crestor '10mg'$  currently.  While on this medicine, her LDL was in the 60s.   Excellent.  She admits that she is not quite sure why she is not on it currently.  We will go ahead and restart Crestor 10 mg.  Baseline LDL 148.  HTN  - well controlled on current meds.  She did state that she has some lower extremity edema at times.  Could be dependent edema.  Amlodipine 2.5 mg is a very low-dose and may be contributing somewhat.  Her son gave her a compression device that she can wear for 20 minutes where it squeezes her legs off and on.  She will begin to use this once again.  Continue with triamterene hydrochlorothiazide or Maxide.  Excellent control of blood pressure.    Shared Decision Making/Informed Consent{     Medication Adjustments/Labs and Tests Ordered: Current medicines are reviewed at length with the patient today.  Concerns regarding medicines are outlined above.  Orders Placed This Encounter  Procedures   Basic metabolic panel   Meds ordered this encounter  Medications   metoprolol tartrate (LOPRESSOR) 100 MG tablet    Sig: Take 1 tablet (100 mg) 2 hours before CT    Dispense:  1 tablet    Refill:  0   rosuvastatin (CRESTOR) 10 MG tablet    Sig: Take 1 tablet (10 mg total) by mouth daily.    Dispense:  90 tablet    Refill:  3    Patient Instructions    Your cardiac CT will be scheduled at one of the below locations:   [facility] [address] [place], [place] [address] (336) [place] [address] [place], [place] [address] [phone]  [facility] [place], [place] [address] [phone]  If scheduled at [facility], please arrive at the [facility] and Children's Entrance (Entrance C2) of [facility] 30 minutes prior to test start time. You can use the FREE valet parking offered at entrance C (encouraged to control the heart rate for  the test)  Proceed to the [facility] Radiology Department  (first floor) to check-in and test prep.  All radiology patients and guests should use entrance C2 at [facility], accessed from [facility], even though the hospital's physical address listed is [address].    If scheduled at [facility] or [facility], please arrive 15 mins early for check-in and test prep.   Please follow these instructions carefully (unless otherwise directed):   On the Night Before the Test: Be sure to Drink plenty of water. Do not consume any caffeinated/decaffeinated beverages or chocolate 12 hours prior to your test. Do not take any antihistamines 12 hours prior to your test.  On the Day of the Test: Drink plenty of water until 1 hour prior to the test. Do not eat any food 1 hour prior to test. You may take your regular medications prior to the test.  Take metoprolol (Lopressor) two hours prior to test. HOLD Furosemide/Hydrochlorothiazide morning of the test. FEMALES- please wear underwire-free bra if available, avoid dresses & tight clothing       After the Test: Drink plenty of water. After receiving IV contrast, you may experience a mild flushed feeling. This is normal. On occasion, you may experience a mild rash up to 24 hours after the test. This is not dangerous. If this occurs, you can take Benadryl 25 mg and increase your fluid intake. If you experience trouble breathing, this can be serious. If it is severe call 911 IMMEDIATELY. If it is mild, please call our office. If you take any of these medications: Glipizide/Metformin, Avandament, Glucavance, please do not take 48 hours after completing test unless otherwise instructed.  We will call to schedule your test 2-4 weeks out understanding that some insurance companies will need an authorization prior to the service being performed.   For non-scheduling related questions, please contact the cardiac imaging nurse navigator  should you have any questions/concerns: Marchia Bond, Cardiac Imaging Nurse Navigator Gordy Clement, Cardiac Imaging Nurse Navigator [place] [facility] Direct Office Dial: [phone]   For scheduling needs, including cancellations and rescheduling, please call [place], [phone].        Get BMET lab today   Re-Start Crestor 10 mg daily at dinner or bedtime    Follow up as needed.    Signed, Candee Furbish, MD  06/04/2022 1:28 PM    [facility]

## 2022-06-17 ENCOUNTER — Telehealth: Payer: Self-pay | Admitting: Cardiology

## 2022-06-17 NOTE — Telephone Encounter (Signed)
Spoke to pt who stated that she does not want to have CTA completed as she feels like she does not need this test. Pt stated that the origin of her CP was heartburn and this has gotten better over the last several days.   CTA canceled per pt's request   Will fwd to provider as FYI.

## 2022-06-17 NOTE — Telephone Encounter (Signed)
Patient states she was calling in bout  procedure that she discuss with her at her appt. Please advise

## 2022-06-27 ENCOUNTER — Ambulatory Visit (INDEPENDENT_AMBULATORY_CARE_PROVIDER_SITE_OTHER): Payer: PPO | Admitting: Family Medicine

## 2022-06-27 ENCOUNTER — Encounter: Payer: Self-pay | Admitting: Family Medicine

## 2022-06-27 VITALS — BP 127/79 | HR 84 | Ht 61.0 in | Wt 191.1 lb

## 2022-06-27 DIAGNOSIS — E785 Hyperlipidemia, unspecified: Secondary | ICD-10-CM

## 2022-06-27 DIAGNOSIS — I10 Essential (primary) hypertension: Secondary | ICD-10-CM | POA: Diagnosis not present

## 2022-06-27 NOTE — Assessment & Plan Note (Signed)
Controlled, no change in medication DASH diet and commitment to daily physical activity for a minimum of 30 minutes discussed and encouraged, as a part of hypertension management. The importance of attaining a healthy weight is also discussed.     06/27/2022    2:00 PM 06/04/2022   12:49 PM 04/24/2022   10:55 AM 03/05/2022    3:32 PM 03/05/2022    2:47 PM 01/08/2022    9:11 AM 01/08/2022    8:42 AM  BP/Weight  Systolic BP 658 006 349 494 473 958 441  Diastolic BP 79 76 83 712 80 80 77  Wt. (Lbs) 191.12 193 191  191.12  193  BMI 36.11 kg/m2 36.47 kg/m2 36.09 kg/m2  34.96 kg/m2  35.3 kg/m2

## 2022-06-27 NOTE — Patient Instructions (Addendum)
Annual exam in march, call if you need me sooner  Excellent blood pressure, no med change   Nurse pls get covid and flu vaccines for [facility] , [address]  Please get fasting labs already ordered last week in Feb  Yes please do get the study recommended by Cardiology, you need to contact them about this, and I will also message the Provider as we discussed  It is important that you exercise regularly at least 30 minutes 5 times a week. If you develop chest pain, have severe difficulty breathing, or feel very tired, stop exercising immediately and seek medical attention   Thanks for choosing [facility], we consider it a privelige to serve you.

## 2022-06-27 NOTE — Assessment & Plan Note (Signed)
Hyperlipidemia:Low fat diet discussed and encouraged.   Lipid Panel  Lab Results  Component Value Date   CHOL 137 04/17/2022   HDL 57 04/17/2022   LDLCALC 62 04/17/2022   TRIG 95 04/17/2022   CHOLHDL 2.4 04/17/2022     Updated lab needed at/ before next visit. Controlled, no change in medication

## 2022-06-27 NOTE — Assessment & Plan Note (Signed)
  Patient re-educated about  the importance of commitment to a  minimum of 150 minutes of exercise per week as able.  The importance of healthy food choices with portion control discussed, as well as eating regularly and within a 12 hour window most days. The need to choose "clean , green" food 50 to 75% of the time is discussed, as well as to make water the primary drink and set a goal of 64 ounces water daily.       06/27/2022    2:00 PM 06/04/2022   12:49 PM 04/24/2022   10:55 AM  Weight /BMI  Weight 191 lb 1.9 oz 193 lb 191 lb  Height '5\' 1"'$  (1.549 m)  '5\' 1"'$  (1.549 m)  BMI 36.11 kg/m2 36.47 kg/m2 36.09 kg/m2

## 2022-06-27 NOTE — Progress Notes (Signed)
Yvonne Brewer     MRN: [id number]      DOB: Jan 02, 1946   HPI Yvonne Brewer is here for follow up and re-evaluation of chronic medical conditions, specifically uncontrolled blood pressure,medication management and review of any available recent lab and radiology data.  Preventive health is updated, specifically  Cancer screening and Immunization.   Questions or concerns regarding consultations or procedures which the PT has had in the interim are  addressed. The PT denies any adverse reactions to current medications since the last visit. Tolerating medication well, states she has not taken one of her meds for over 2 weeks Did not understand that Cardiology had recommended CTA and states will f/u and get this ROS Denies recent fever or chills. Denies sinus pressure, nasal congestion, ear pain or sore throat. Denies chest congestion, productive cough or wheezing. Denies chest pains, palpitations and leg swelling Denies abdominal pain, nausea, vomiting,diarrhea or constipation.   Denies dysuria, frequency, hesitancy or incontinence. Denies joint pain, swelling and limitation in mobility. Denies headaches, seizures, numbness, or tingling. Denies depression, anxiety or insomnia. Denies skin break down or rash.   PE  BP 127/79 (BP Location: Right Arm, Patient Position: Sitting, Cuff Size: Large)   Pulse 84   Ht '5\' 1"'$  (1.549 m)   Wt 191 lb 1.9 oz (86.7 kg)   SpO2 93%   BMI 36.11 kg/m   Patient alert and oriented and in no cardiopulmonary distress.  HEENT: No facial asymmetry, EOMI,     Neck supple .  Chest: Clear to auscultation bilaterally.  CVS: S1, S2 no murmurs, no S3.Regular rate.  ABD: Soft non tender.   Ext: No edema  MS: Adequate ROM spine, shoulders, hips and knees.  Skin: Intact, no ulcerations or rash noted.  Psych: Good eye contact, normal affect. Memory intact not anxious or depressed appearing.  CNS: CN 2-12 intact, power,  normal throughout.no focal  deficits noted.   Assessment & Plan  Essential hypertension Controlled, no change in medication DASH diet and commitment to daily physical activity for a minimum of 30 minutes discussed and encouraged, as a part of hypertension management. The importance of attaining a healthy weight is also discussed.     06/27/2022    2:00 PM 06/04/2022   12:49 PM 04/24/2022   10:55 AM 03/05/2022    3:32 PM 03/05/2022    2:47 PM 01/08/2022    9:11 AM 01/08/2022    8:42 AM  BP/Weight  Systolic BP 163 845 364 680 321 224 825  Diastolic BP 79 76 83 003 80 80 77  Wt. (Lbs) 191.12 193 191  191.12  193  BMI 36.11 kg/m2 36.47 kg/m2 36.09 kg/m2  34.96 kg/m2  35.3 kg/m2       Hyperlipidemia LDL goal <100 Hyperlipidemia:Low fat diet discussed and encouraged.   Lipid Panel  Lab Results  Component Value Date   CHOL 137 04/17/2022   HDL 57 04/17/2022   LDLCALC 62 04/17/2022   TRIG 95 04/17/2022   CHOLHDL 2.4 04/17/2022     Updated lab needed at/ before next visit. Controlled, no change in medication   Morbid obesity ([place])  Patient re-educated about  the importance of commitment to a  minimum of 150 minutes of exercise per week as able.  The importance of healthy food choices with portion control discussed, as well as eating regularly and within a 12 hour window most days. The need to choose "clean , green" food 50 to 75% of the time  is discussed, as well as to make water the primary drink and set a goal of 64 ounces water daily.       06/27/2022    2:00 PM 06/04/2022   12:49 PM 04/24/2022   10:55 AM  Weight /BMI  Weight 191 lb 1.9 oz 193 lb 191 lb  Height '5\' 1"'$  (1.549 m)  '5\' 1"'$  (1.549 m)  BMI 36.11 kg/m2 36.47 kg/m2 36.09 kg/m2

## 2022-06-28 ENCOUNTER — Other Ambulatory Visit: Payer: Self-pay

## 2022-06-28 DIAGNOSIS — R079 Chest pain, unspecified: Secondary | ICD-10-CM

## 2022-07-04 ENCOUNTER — Encounter: Payer: Self-pay | Admitting: Internal Medicine

## 2022-07-04 ENCOUNTER — Ambulatory Visit (INDEPENDENT_AMBULATORY_CARE_PROVIDER_SITE_OTHER): Payer: PPO | Admitting: Internal Medicine

## 2022-07-04 VITALS — BP 148/68 | HR 49 | Ht 62.5 in | Wt 189.8 lb

## 2022-07-04 DIAGNOSIS — J40 Bronchitis, not specified as acute or chronic: Secondary | ICD-10-CM

## 2022-07-04 DIAGNOSIS — J069 Acute upper respiratory infection, unspecified: Secondary | ICD-10-CM

## 2022-07-04 MED ORDER — GUAIFENESIN-DM 100-10 MG/5ML PO SYRP: 5.0000 mL | ORAL_SOLUTION | ORAL | 0 refills | Status: DC | PRN

## 2022-07-04 NOTE — Patient Instructions (Signed)
It was a pleasure to see you today.  Thank you for giving [place] the opportunity to be involved in your care.  Below is a brief recap of your visit and next steps.  We will plan to see you again in February.  Summary I have prescribed Robitussin-DM for cough relief today We will test for covid / influenza I recommend using saline nasal rinse followed by fluticasone nasal spray as well as a nasal decongestant such as sudafed for your nasal / sinus congestion. You can also try tylenol for pain relief. Please notify us If your symptoms are worsening, you develop fever, become short of breath, or have a significant worsening of mucus production.

## 2022-07-04 NOTE — Progress Notes (Signed)
Acute Office Visit  Subjective:     Patient ID: Yvonne Brewer, female    DOB: 19-Mar-1946, 75 y.o.   MRN: [id number]  Chief Complaint  Patient presents with   Sinusitis    Cough, chest congestion, sore throat   Yvonne Brewer presents for an acute visit today endorsing a 4-day history of cough, chest congestion, and sore throat.  She has been afebrile.  She denies myalgias, fatigue, and shortness of breath.  Her cough is dry.  She has not produced any sputum.  She endorses nasal congestion with clear drainage.  Yvonne Brewer states that her husband may have been sick recently, but she is otherwise unaware of any sick contacts.  She has been vaccinated against COVID-19 and influenza.  She has tried taking Mucinex without sustained symptomatic improvement.  Review of Systems  Constitutional:  Negative for chills, fever and malaise/fatigue.  HENT:  Positive for congestion, sinus pain and sore throat. Negative for ear discharge and ear pain.   Respiratory:  Positive for cough. Negative for sputum production, shortness of breath and wheezing.   All other systems reviewed and are negative.     Objective:    BP (!) 148/68   Pulse (!) 49   Ht 5' 2.5" (1.588 m)   Wt 189 lb 12.8 oz (86.1 kg)   SpO2 95%   BMI 34.16 kg/m   Physical Exam Constitutional:      General: She is not in acute distress.    Appearance: Normal appearance.  HENT:     Head: Normocephalic and atraumatic.     Right Ear: External ear normal.     Left Ear: External ear normal.     Nose: Congestion and rhinorrhea present.     Mouth/Throat:     Mouth: Mucous membranes are moist.     Pharynx: Oropharynx is clear. No oropharyngeal exudate or posterior oropharyngeal erythema.  Eyes:     Extraocular Movements: Extraocular movements intact.     Conjunctiva/sclera: Conjunctivae normal.     Pupils: Pupils are equal, round, and reactive to light.  Cardiovascular:     Rate and Rhythm: Normal rate and regular rhythm.   Pulmonary:     Effort: Pulmonary effort is normal. No respiratory distress.     Breath sounds: Normal breath sounds. No wheezing, rhonchi or rales.  Abdominal:     General: Abdomen is flat. Bowel sounds are normal. There is no distension.     Palpations: Abdomen is soft.     Tenderness: There is no abdominal tenderness.  Musculoskeletal:     Cervical back: Normal range of motion.  Lymphadenopathy:     Cervical: No cervical adenopathy.  Skin:    General: Skin is warm and dry.     Capillary Refill: Capillary refill takes less than 2 seconds.     Coloration: Skin is not jaundiced.  Neurological:     Mental Status: She is alert.       Assessment & Plan:   Problem List Items Addressed This Visit       Viral URI with cough    Presenting today endorsing a 4-day history of the symptoms as noted above.  She has a dry cough without sputum production.  She is afebrile and denies myalgias.  She endorses nasal congestion with clear drainage.  Her symptoms today seem most consistent with a viral upper respiratory tract infection.  I have ordered COVID/influenza testing.  I have also recommended continued supportive care measures such as maintaining  adequate hydration, using saline rinses and fluticasone nasal spray, nasal decongestant use, and taking Tylenol as needed for pain relief.  At her request, I have prescribed Robitussin-DM.   -She will follow-up as needed if her symptoms do not improve      Meds ordered this encounter  Medications   guaiFENesin-dextromethorphan (ROBITUSSIN DM) 100-10 MG/5ML syrup    Sig: Take 5 mLs by mouth every 4 (four) hours as needed for cough.    Dispense:  118 mL    Refill:  0    Return if symptoms worsen or fail to improve.  Johnette Abraham, MD

## 2022-07-08 ENCOUNTER — Telehealth: Payer: Self-pay | Admitting: Family Medicine

## 2022-07-08 NOTE — Telephone Encounter (Signed)
Patient is having CT on 11/17 needs METROPROLOL sent in.  Needs to take 2 hrs before CT. Wants a call back when sent in.

## 2022-07-08 NOTE — Telephone Encounter (Signed)
Pt aware med was sent on 10/10 and to call [facility] and have them fill

## 2022-07-09 LAB — NOVEL CORONAVIRUS, NAA: SARS-CoV-2, NAA: NOT DETECTED

## 2022-07-09 LAB — INFLUENZA A AND B

## 2022-07-10 DIAGNOSIS — J069 Acute upper respiratory infection, unspecified: Secondary | ICD-10-CM | POA: Insufficient documentation

## 2022-07-10 NOTE — Assessment & Plan Note (Signed)
Presenting today endorsing a 4-day history of the symptoms as noted above.  She has a dry cough without sputum production.  She is afebrile and denies myalgias.  She endorses nasal congestion with clear drainage.  Her symptoms today seem most consistent with a viral upper respiratory tract infection.  I have ordered COVID/influenza testing.  I have also recommended continued supportive care measures such as maintaining adequate hydration, using saline rinses, and taking Tylenol as needed for pain relief.  At her request, I have prescribed Robitussin-DM.   -She will follow-up as needed if her symptoms do not improve

## 2022-07-11 ENCOUNTER — Telehealth (HOSPITAL_COMMUNITY): Payer: Self-pay | Admitting: *Deleted

## 2022-07-11 NOTE — Telephone Encounter (Signed)
Reaching out to patient to offer assistance regarding upcoming cardiac imaging study; pt verbalizes understanding of appt date/time, parking situation and where to check in,medications ordered, and verified current allergies; name and call back number provided for further questions should they arise  Gordy Clement RN Navigator Cardiac Imaging [facility] [phone] office [phone] cell  Patient confirmed HR was 76 this morning from the BP cuff.  She was advised to take '50mg'$  metoprolol tartrate if HR was greater than 65bpm and '100mg'$  if HR is greater than 75bpm. She is aware to arrive at 11am.

## 2022-07-12 ENCOUNTER — Ambulatory Visit (HOSPITAL_COMMUNITY)
Admission: RE | Admit: 2022-07-12 | Discharge: 2022-07-12 | Disposition: A | Discharge status: Home / Self Care | Payer: PPO | Source: Ambulatory Visit | Attending: Cardiology | Admitting: Cardiology

## 2022-07-12 DIAGNOSIS — R079 Chest pain, unspecified: Secondary | ICD-10-CM | POA: Diagnosis not present

## 2022-07-12 DIAGNOSIS — I700 Atherosclerosis of aorta: Secondary | ICD-10-CM | POA: Diagnosis not present

## 2022-07-12 DIAGNOSIS — R911 Solitary pulmonary nodule: Secondary | ICD-10-CM | POA: Diagnosis not present

## 2022-07-12 DIAGNOSIS — I2510 Atherosclerotic heart disease of native coronary artery without angina pectoris: Secondary | ICD-10-CM | POA: Diagnosis not present

## 2022-07-12 DIAGNOSIS — K449 Diaphragmatic hernia without obstruction or gangrene: Secondary | ICD-10-CM | POA: Insufficient documentation

## 2022-07-12 MED ORDER — IOHEXOL 350 MG/ML SOLN
100.0000 mL | Freq: Once | INTRAVENOUS | Status: AC | PRN
  Administered 2022-07-12: 100 mL via INTRAVENOUS

## 2022-07-12 MED ORDER — NITROGLYCERIN 0.4 MG SL SUBL
SUBLINGUAL_TABLET | SUBLINGUAL | Status: AC
  Filled 2022-07-12: qty 2

## 2022-07-12 MED ORDER — IOHEXOL 350 MG/ML SOLN: 100.0000 mL | Freq: Once | INTRAVENOUS | Status: DC | PRN

## 2022-07-12 MED ORDER — NITROGLYCERIN 0.4 MG SL SUBL
0.8000 mg | SUBLINGUAL_TABLET | Freq: Once | SUBLINGUAL | Status: AC
  Administered 2022-07-12: 0.8 mg via SUBLINGUAL

## 2022-07-12 MED ORDER — METOPROLOL TARTRATE 5 MG/5ML IV SOLN
10.0000 mg | Freq: Once | INTRAVENOUS | Status: AC
  Administered 2022-07-12: 10 mg via INTRAVENOUS

## 2022-07-12 MED ORDER — METOPROLOL TARTRATE 5 MG/5ML IV SOLN
INTRAVENOUS | Status: AC
  Filled 2022-07-12: qty 10

## 2022-07-15 ENCOUNTER — Telehealth: Payer: Self-pay | Admitting: Family Medicine

## 2022-07-15 ENCOUNTER — Other Ambulatory Visit: Payer: Self-pay

## 2022-07-15 MED ORDER — TRIAMTERENE-HCTZ 37.5-25 MG PO TABS: 1.0000 | ORAL_TABLET | Freq: Every day | ORAL | 1 refills | Status: DC

## 2022-07-15 NOTE — Telephone Encounter (Signed)
Patient called need med refill  triamterene-hydrochlorothiazide (MAXZIDE-25) 37.5-25 MG tablet [[id number]]   [id number] [address] [id number]: [phone] Fax: [phone] #: [id number]

## 2022-07-15 NOTE — Telephone Encounter (Signed)
Refills sent

## 2022-07-22 ENCOUNTER — Encounter: Payer: Self-pay | Admitting: *Deleted

## 2022-09-28 ENCOUNTER — Ambulatory Visit
Admission: EM | Admit: 2022-09-28 | Discharge: 2022-09-28 | Disposition: A | Discharge status: Home / Self Care | Payer: PPO | Attending: Nurse Practitioner | Admitting: Nurse Practitioner

## 2022-09-28 ENCOUNTER — Encounter: Payer: Self-pay | Admitting: Emergency Medicine

## 2022-09-28 DIAGNOSIS — N390 Urinary tract infection, site not specified: Secondary | ICD-10-CM | POA: Diagnosis not present

## 2022-09-28 LAB — POCT URINALYSIS DIP (MANUAL ENTRY)
Glucose, UA: NEGATIVE mg/dL
Ketones, POC UA: NEGATIVE mg/dL
Nitrite, UA: POSITIVE — AB
Protein Ur, POC: 100 mg/dL — AB
Spec Grav, UA: 1.025 (ref 1.010–1.025)
Urobilinogen, UA: 0.2 E.U./dL
pH, UA: 6 (ref 5.0–8.0)

## 2022-09-28 MED ORDER — NITROFURANTOIN MONOHYD MACRO 100 MG PO CAPS: 100.0000 mg | ORAL_CAPSULE | Freq: Two times a day (BID) | ORAL | 0 refills | Status: DC

## 2022-09-28 NOTE — Discharge Instructions (Addendum)
-  The urinalysis shows that you have a urinary tract infection.  A urine culture has been ordered to ensure you are being treated with the appropriate antibiotic.  If the medication needs to be changed, you will be contacted. -Take medications as prescribed. -Increase fluids.  Try to drink at least 8-10 8 ounce glasses of water while symptoms persist. -Ibuprofen or Tylenol for pain, fever, or general discomfort. -Develop a toileting schedule that will allow you to toilet at least every 2 hours. -Avoid caffeine to include tea, soda, and coffee while symptoms persist. -Follow-up in the emergency department if symptoms fail to improve and you develop new symptoms to include fever, chills, abdominal pain, or other concerns.

## 2022-09-28 NOTE — ED Provider Notes (Signed)
[facility]    CSN: [id number] Arrival date & time: 09/28/22  0272      History   Chief Complaint No chief complaint on file.   HPI Yvonne Brewer is a 77 y.o. female.   The history is provided by the patient.   The patient presents for a 2-day history of urinary discomfort, urinary frequency, and urgency.  She denies fever, chills, hematuria, decreased urine stream, flank pain, abdominal pain, nausea, vomiting, or diarrhea.  Patient states that she also has experienced some low back pain that started when her urinary symptoms started.  She reports her last UTI was approximately 10 years ago.   Past Medical History:  Diagnosis Date   Essential hypertension 2015   Female bladder prolapse    History of cystitis    History of skin cancer    Hyperlipidemia    Obesity     Patient Active Problem List   Diagnosis Date Noted   Viral URI with cough 07/10/2022   IGT (impaired glucose tolerance) 04/24/2022   Abnormal EKG 03/12/2022   Varicose veins of leg with swelling 03/18/2021   Left shoulder pain 07/10/2020   Heartburn 07/07/2019   Fracture, foot, left, sequela 02/09/2018   Dyspepsia 04/18/2017   Osteoarthritis of both hips 04/18/2017   History of colonic polyps 11/13/2016   Pseudophakia 11/12/2016   Urinary incontinence in female 07/29/2016   Environmental allergies 07/14/2016   Chronic radicular pain of lower back 03/13/2014   Reduced vision 03/13/2014   Essential hypertension 03/08/2014   Morbid obesity ([place]) 02/19/2010   Vitamin D deficiency 07/27/2009   Hyperlipidemia LDL goal <100 02/19/2008   Disorder of bladder 02/19/2008    Past Surgical History:  Procedure Laterality Date   CATARACT EXTRACTION  2012   bilateral    COLONOSCOPY  11/06/2011   Procedure: COLONOSCOPY;  Surgeon: Rogene Houston, MD;  [facility]: [facility];  Service: Endoscopy;  Laterality: N/A;  7:30   COLONOSCOPY N/A 05/21/2017   Procedure: COLONOSCOPY;  Surgeon: Rogene Houston, MD;  Location: [facility];  Service: Endoscopy;  Laterality: N/A;  730-moved to 9/26 '@10'$ :30am per Lelon Frohlich   EYE SURGERY  2011   bilateral cataract with implants   HAMMER TOE SURGERY Left 01/2016   Left bunionectomy     Squamous cell ca rt ant chest 1989     YAG LASER APPLICATION Left 5/36/6440   Procedure: YAG LASER APPLICATION;  Surgeon: Rutherford Guys, MD;  Location: [facility];  Service: Ophthalmology;  Laterality: Left;   YAG LASER APPLICATION Right 3/47/4259   Procedure: YAG LASER APPLICATION;  Surgeon: Rutherford Guys, MD;  Location: [facility];  Service: Ophthalmology;  Laterality: Right;    OB History   No obstetric history on file.      Home Medications    Prior to Admission medications   Medication Sig Start Date End Date Taking? Authorizing Provider  amLODipine (NORVASC) 2.5 MG tablet Take 2.5 mg by mouth daily. 06/27/22  Yes Fayrene Helper, MD  nitrofurantoin, macrocrystal-monohydrate, (MACROBID) 100 MG capsule Take 1 capsule (100 mg total) by mouth 2 (two) times daily. 09/28/22  Yes Garnet Overfield-Warren, Alda Lea, NP  calcium elemental as carbonate (BARIATRIC TUMS ULTRA) 400 MG chewable tablet Chew 1,000 mg by mouth daily. Uses only as needed    [provider]  guaiFENesin-dextromethorphan (ROBITUSSIN DM) 100-10 MG/5ML syrup Take 5 mLs by mouth every 4 (four) hours as needed for cough. 07/04/22   Johnette Abraham, MD  metoprolol tartrate (LOPRESSOR) 100 MG tablet Take 1 tablet (100 mg) 2 hours before CT 06/04/22   Jerline Pain, MD  rosuvastatin (CRESTOR) 10 MG tablet Take 1 tablet (10 mg total) by mouth daily. 06/04/22   Jerline Pain, MD  triamterene-hydrochlorothiazide (MAXZIDE-25) 37.5-25 MG tablet Take 1 tablet by mouth daily. 07/15/22   Fayrene Helper, MD    Family History Family History  Problem Relation Age of Onset   Stroke Mother    Hypertension Mother    Heart disease Father    Cancer Maternal Grandmother    Anesthesia problems Neg Hx     Hypotension Neg Hx    Malignant hyperthermia Neg Hx    Pseudochol deficiency Neg Hx     Social History Social History   Tobacco Use   Smoking status: Never   Smokeless tobacco: Never  Vaping Use   Vaping Use: Never used  Substance Use Topics   Alcohol use: Yes    Comment: glass of red wine most nights or scotch and water   Drug use: No     Allergies   Ace inhibitors, Amlodipine, Penicillins, Statins, and Sulfonamide derivatives   Review of Systems Review of Systems Per HPI  Physical Exam Triage Vital Signs ED Triage Vitals  Enc Vitals Group     BP 09/28/22 0935 (!) 151/87     Pulse Rate 09/28/22 0935 80     Resp 09/28/22 0935 18     Temp 09/28/22 0935 98 F (36.7 C)     Temp Source 09/28/22 0935 Oral     SpO2 09/28/22 0935 93 %     Weight --      Height --      Head Circumference --      Peak Flow --      Pain Score 09/28/22 0936 2     Pain Loc --      Pain Edu? --      Excl. in [place]? --    No data found.  Updated Vital Signs BP (!) 151/87 (BP Location: Right Arm)   Pulse 80   Temp 98 F (36.7 C) (Oral)   Resp 18   SpO2 93%   Visual Acuity Right Eye Distance:   Left Eye Distance:   Bilateral Distance:    Right Eye Near:   Left Eye Near:    Bilateral Near:     Physical Exam Vitals and nursing note reviewed.  Constitutional:      General: She is not in acute distress.    Appearance: She is well-developed.  HENT:     Head: Normocephalic.  Eyes:     Extraocular Movements: Extraocular movements intact.     Pupils: Pupils are equal, round, and reactive to light.  Cardiovascular:     Rate and Rhythm: Normal rate and regular rhythm.     Pulses: Normal pulses.     Heart sounds: Normal heart sounds.  Pulmonary:     Effort: Pulmonary effort is normal.     Breath sounds: Normal breath sounds.  Abdominal:     General: Bowel sounds are normal. There is no distension.     Palpations: Abdomen is soft.     Tenderness: There is no abdominal  tenderness. There is no right CVA tenderness, left CVA tenderness, guarding or rebound.  Genitourinary:    Vagina: Normal. No vaginal discharge.  Musculoskeletal:     Cervical back: Normal range of motion.  Skin:    General: Skin is warm  and dry.     Findings: No erythema or rash.  Neurological:     General: No focal deficit present.     Mental Status: She is alert and oriented to person, place, and time.     Cranial Nerves: No cranial nerve deficit.  Psychiatric:        Mood and Affect: Mood normal.        Behavior: Behavior normal.      UC Treatments / Results  Labs (all labs ordered are listed, but only abnormal results are displayed) Labs Reviewed  POCT URINALYSIS DIP (MANUAL ENTRY) - Abnormal; Notable for the following components:      Result Value   Clarity, UA cloudy (*)    Bilirubin, UA small (*)    Blood, UA large (*)    Protein Ur, POC =100 (*)    Nitrite, UA Positive (*)    Leukocytes, UA Large (3+) (*)    All other components within normal limits  URINE CULTURE    EKG   Radiology No results found.  Procedures Procedures (including critical care time)  Medications Ordered in UC Medications - No data to display  Initial Impression / Assessment and Plan / UC Course  I have reviewed the triage vital signs and the nursing notes.  Pertinent labs & imaging results that were available during my care of the patient were reviewed by me and considered in my medical decision making (see chart for details).  The patient is well-appearing, she is in no acute distress, she is hypertensive, but vital signs are otherwise stable.  Urinalysis positive for, leukocytes, and blood.  Symptoms are consistent with acute cystitis.  Will treat patient with Macrobid 100 mg twice daily for the next 5 days.  Urine culture is pending to ensure patient is being treated with the appropriate antibiotic.  Supportive care recommendations were provided to the patient to include  increasing fluids, allowing for plenty of rest, develop a toileting schedule.  Patient was given strict indications of when follow-up in the emergency department may be necessary.  Patient verbalizes understanding.  All questions were answered.  Patient stable for discharge.   Final Clinical Impressions(s) / UC Diagnoses   Final diagnoses:  Acute UTI     Discharge Instructions      -The urinalysis shows that you have a urinary tract infection.  A urine culture has been ordered to ensure you are being treated with the appropriate antibiotic.  If the medication needs to be changed, you will be contacted. -Take medications as prescribed. -Increase fluids.  Try to drink at least 8-10 8 ounce glasses of water while symptoms persist. -Ibuprofen or Tylenol for pain, fever, or general discomfort. -Develop a toileting schedule that will allow you to toilet at least every 2 hours. -Avoid caffeine to include tea, soda, and coffee while symptoms persist. -Follow-up in the emergency department if symptoms fail to improve and you develop new symptoms to include fever, chills, abdominal pain, or other concerns.      ED Prescriptions     Medication Sig Dispense Auth. Provider   nitrofurantoin, macrocrystal-monohydrate, (MACROBID) 100 MG capsule Take 1 capsule (100 mg total) by mouth 2 (two) times daily. 10 capsule Kaydie Petsch-Warren, Alda Lea, NP      PDMP not reviewed this encounter.   Tish Men, NP 09/28/22 1011

## 2022-09-28 NOTE — ED Triage Notes (Signed)
Urinary frequency, urinary pressure, x 2 days  some lower back pain yesterday.

## 2022-09-30 LAB — URINE CULTURE: Culture: >=100000 — AB

## 2022-10-03 ENCOUNTER — Encounter: Payer: Self-pay | Admitting: Family Medicine

## 2022-10-03 ENCOUNTER — Ambulatory Visit (INDEPENDENT_AMBULATORY_CARE_PROVIDER_SITE_OTHER): Payer: PPO | Admitting: Family Medicine

## 2022-10-03 VITALS — BP 140/92 | HR 78 | Ht 62.0 in | Wt 189.0 lb

## 2022-10-03 DIAGNOSIS — I10 Essential (primary) hypertension: Secondary | ICD-10-CM | POA: Diagnosis not present

## 2022-10-03 DIAGNOSIS — Z0001 Encounter for general adult medical examination with abnormal findings: Secondary | ICD-10-CM | POA: Diagnosis not present

## 2022-10-03 DIAGNOSIS — E785 Hyperlipidemia, unspecified: Secondary | ICD-10-CM | POA: Diagnosis not present

## 2022-10-03 DIAGNOSIS — R7302 Impaired glucose tolerance (oral): Secondary | ICD-10-CM | POA: Diagnosis not present

## 2022-10-03 DIAGNOSIS — R300 Dysuria: Secondary | ICD-10-CM | POA: Diagnosis not present

## 2022-10-03 DIAGNOSIS — E559 Vitamin D deficiency, unspecified: Secondary | ICD-10-CM | POA: Diagnosis not present

## 2022-10-03 LAB — POCT URINALYSIS DIP (CLINITEK)
Bilirubin, UA: NEGATIVE
Glucose, UA: NEGATIVE mg/dL
Ketones, POC UA: NEGATIVE mg/dL
Nitrite, UA: POSITIVE — AB
POC PROTEIN,UA: 30 — AB
Spec Grav, UA: 1.02 (ref 1.010–1.025)
Urobilinogen, UA: 0.2 E.U./dL
pH, UA: 5.5 (ref 5.0–8.0)

## 2022-10-03 MED ORDER — CIPROFLOXACIN HCL 500 MG PO TABS: 500.0000 mg | ORAL_TABLET | Freq: Two times a day (BID) | ORAL | 0 refills | Status: AC

## 2022-10-03 MED ORDER — AMLODIPINE BESYLATE 5 MG PO TABS: 5.0000 mg | ORAL_TABLET | Freq: Every day | ORAL | 3 refills | Status: DC

## 2022-10-03 NOTE — Patient Instructions (Addendum)
F/U 8  week re evaluate blood pressure  Return at 12:50  with meds you are taking please  New 5 day course of cipro sent for UTI  Labs at 1 pm today ordered in 04/24/2022,  Normal HR range  from is 60 100, generally 60 to 80 most of the time   Higher dose of amlodipine is prescribed 5 mg one daily, I have sent this in, you may take two 2.5 mg tablets together every day if you have a lot of tablets that dose, however , so as not to get confused, better for you to tfill and start taking the ONE 5 mg tablet once daily. Please continue triamterene one daily as you are currently doing  I will see what lab work shows before recommendation about rosuvastatin . I have taken off your current list, because even if you do resume it , I know we will not start at one daily  It is important that you exercise regularly at least 30 minutes 5 times a week. If you develop chest pain, have severe difficulty breathing, or feel very tired, stop exercising immediately and seek medical attention    Thanks for choosing [facility], we consider it a privelige to serve you.

## 2022-10-03 NOTE — Progress Notes (Signed)
    Yvonne Brewer     MRN: [id number]      DOB: 07-Jan-1946  HPI: Patient is in for annual physical exam. Uncontrolled blood pressure is addressed. Inadequately treated UTI is addressed and new antibiotic prescribed Labs drawn today and will be reviewed Immunization is reviewed   PE: BP (!) 140/92   Pulse 78   Ht '5\' 2"'$  (1.575 m)   Wt 189 lb 0.6 oz (85.7 kg)   SpO2 97%   BMI 34.58 kg/m   Pleasant  female, alert and oriented x 3, in no cardio-pulmonary distress. Afebrile. HEENT No facial trauma or asymetry. Sinuses non tender.  Extra occullar muscles intact.. External ears normal, . Neck: supple, no adenopathy,JVD or thyromegaly.No bruits.  Chest: Clear to ascultation bilaterally.No crackles or wheezes. Non tender to palpation  Cardiovascular system; Heart sounds normal,  S1 and  S2 ,no S3.  No murmur, or thrill. Apical beat not displaced Peripheral pulses normal.  Abdomen: Soft, non tender, no organomegaly or masses. No bruits. Bowel sounds normal. No guarding, tenderness or rebound.     Musculoskeletal exam: Full ROM of spine, hips , shoulders and knees. No deformity ,swelling or crepitus noted. No muscle wasting or atrophy.   Neurologic: Cranial nerves 2 to 12 intact. Power, tone ,sensation  normal throughout. No disturbance in gait. No tremor.  Skin: Intact, no ulceration, erythema , scaling or rash noted. Pigmentation normal throughout  Psych; Normal mood and affect. Judgement and concentration normal   Assessment & Plan:  Annual visit for general adult medical examination with abnormal findings Annual exam as documented. Counseling done  re healthy lifestyle involving commitment to 150 minutes exercise per week, heart healthy diet, and attaining healthy weight.The importance of adequate sleep also discussed. . Changes in health habits are decided on by the patient with goals and time frames  set for achieving them. Immunization and cancer  screening needs are specifically addressed at this visit.

## 2022-10-03 NOTE — Assessment & Plan Note (Signed)
Annual exam as documented. Counseling done  re healthy lifestyle involving commitment to 150 minutes exercise per week, heart healthy diet, and attaining healthy weight.The importance of adequate sleep also discussed. Changes in health habits are decided on by the patient with goals and time frames  set for achieving them. Immunization and cancer screening needs are specifically addressed at this visit. 

## 2022-10-04 LAB — CMP14+EGFR
ALT: 12 IU/L (ref 0–32)
AST: 16 IU/L (ref 0–40)
Albumin/Globulin Ratio: 1.7 (ref 1.2–2.2)
Albumin: 4.3 g/dL (ref 3.8–4.8)
Alkaline Phosphatase: 70 IU/L (ref 44–121)
BUN/Creatinine Ratio: 20 (ref 12–28)
BUN: 20 mg/dL (ref 8–27)
Bilirubin Total: 0.5 mg/dL (ref 0.0–1.2)
CO2: 23 mmol/L (ref 20–29)
Calcium: 9.5 mg/dL (ref 8.7–10.3)
Chloride: 100 mmol/L (ref 96–106)
Creatinine, Ser: 0.99 mg/dL (ref 0.57–1.00)
Globulin, Total: 2.5 g/dL (ref 1.5–4.5)
Glucose: 103 mg/dL — ABNORMAL HIGH (ref 70–99)
Potassium: 4.3 mmol/L (ref 3.5–5.2)
Sodium: 141 mmol/L (ref 134–144)
Total Protein: 6.8 g/dL (ref 6.0–8.5)
eGFR: 59 mL/min/{1.73_m2} — ABNORMAL LOW (ref >59)

## 2022-10-04 LAB — HEMOGLOBIN A1C
Est. average glucose Bld gHb Est-mCnc: 120 mg/dL
Hgb A1c MFr Bld: 5.8 % — ABNORMAL HIGH (ref 4.8–5.6)

## 2022-10-04 LAB — LIPID PANEL
Chol/HDL Ratio: 3.8 ratio (ref 0.0–4.4)
Cholesterol, Total: 202 mg/dL — ABNORMAL HIGH (ref 100–199)
HDL: 53 mg/dL (ref >39)
LDL Chol Calc (NIH): 126 mg/dL — ABNORMAL HIGH (ref 0–99)
Triglycerides: 130 mg/dL (ref 0–149)
VLDL Cholesterol Cal: 23 mg/dL (ref 5–40)

## 2022-10-04 LAB — VITAMIN D 25 HYDROXY (VIT D DEFICIENCY, FRACTURES): Vit D, 25-Hydroxy: 31.9 ng/mL (ref 30.0–100.0)

## 2022-10-04 LAB — TSH: TSH: 1.24 u[IU]/mL (ref 0.450–4.500)

## 2022-10-10 LAB — URINE CULTURE

## 2022-10-18 ENCOUNTER — Ambulatory Visit (INDEPENDENT_AMBULATORY_CARE_PROVIDER_SITE_OTHER): Payer: PPO | Admitting: Internal Medicine

## 2022-10-18 ENCOUNTER — Other Ambulatory Visit: Payer: Self-pay | Admitting: Internal Medicine

## 2022-10-18 ENCOUNTER — Encounter: Payer: Self-pay | Admitting: Internal Medicine

## 2022-10-18 VITALS — BP 138/82 | HR 103 | Ht 62.0 in | Wt 191.4 lb

## 2022-10-18 DIAGNOSIS — R300 Dysuria: Secondary | ICD-10-CM

## 2022-10-18 DIAGNOSIS — N3001 Acute cystitis with hematuria: Secondary | ICD-10-CM

## 2022-10-18 LAB — POCT URINALYSIS DIP (CLINITEK)
Bilirubin, UA: NEGATIVE
Glucose, UA: NEGATIVE mg/dL
Ketones, POC UA: NEGATIVE mg/dL
Nitrite, UA: POSITIVE — AB
POC PROTEIN,UA: NEGATIVE
Spec Grav, UA: 1.02 (ref 1.010–1.025)
Urobilinogen, UA: 0.2 E.U./dL
pH, UA: 5.5 (ref 5.0–8.0)

## 2022-10-18 MED ORDER — CIPROFLOXACIN HCL 500 MG PO TABS: 500.0000 mg | ORAL_TABLET | Freq: Two times a day (BID) | ORAL | 0 refills | Status: AC

## 2022-10-18 NOTE — Progress Notes (Signed)
Acute Office Visit  Subjective:     Patient ID: Yvonne Brewer, female    DOB: Aug 19, 1946, 77 y.o.   MRN: [id number]  Chief Complaint  Patient presents with   Urinary Tract Infection    Frequency    Yvonne Brewer presents today for an acute visit for persistent UTI symptoms.  She currently endorses increased urinary frequency, dysuria, discoloration of urine with abnormal odor, and increased pelvic pressure, and mild low back pain.  She initially presented to urgent care on 2/3 endorsing symptoms concerning for UTI.  UA was consistent with infection.  Macrobid was empirically prescribed.  Her urine culture grew Klebsiella with intermediate susceptibility to Macrobid.  She was seen at [facility] by Dr. Moshe Cipro for her annual exam on 2/8 at which time she continued to endorse symptoms concerning for UTI.  Repeat UA remained consistent with infection and urine culture at that time grew Klebsiella resistant to Macrobid.  She was treated with Cipro 500 mg twice daily x 5 days.  Despite these measures, her symptoms have persisted.   Review of Systems  Constitutional:  Negative for chills, fever and malaise/fatigue.  Genitourinary:  Positive for dysuria and frequency.  Musculoskeletal:  Positive for back pain.  All other systems reviewed and are negative.       Objective:    BP 138/82   Pulse (!) 103   Ht '5\' 2"'$  (1.575 m)   Wt 191 lb 6.4 oz (86.8 kg)   SpO2 94%   BMI 35.01 kg/m    Physical Exam Vitals reviewed.  Constitutional:      General: She is not in acute distress.    Appearance: Normal appearance. She is obese. She is not toxic-appearing.  HENT:     Head: Normocephalic and atraumatic.     Right Ear: External ear normal.     Left Ear: External ear normal.     Nose: Nose normal. No congestion or rhinorrhea.     Mouth/Throat:     Mouth: Mucous membranes are moist.     Pharynx: Oropharynx is clear. No oropharyngeal exudate or posterior oropharyngeal erythema.  Eyes:      General: No scleral icterus.    Extraocular Movements: Extraocular movements intact.     Conjunctiva/sclera: Conjunctivae normal.     Pupils: Pupils are equal, round, and reactive to light.  Cardiovascular:     Rate and Rhythm: Normal rate and regular rhythm.     Pulses: Normal pulses.     Heart sounds: Normal heart sounds. No murmur heard.    No friction rub. No gallop.  Pulmonary:     Effort: Pulmonary effort is normal.     Breath sounds: Normal breath sounds. No wheezing, rhonchi or rales.  Abdominal:     General: Abdomen is flat. Bowel sounds are normal. There is no distension.     Palpations: Abdomen is soft.     Tenderness: There is no abdominal tenderness. There is no right CVA tenderness, left CVA tenderness, guarding or rebound.  Musculoskeletal:        General: No swelling. Normal range of motion.     Cervical back: Normal range of motion.     Right lower leg: No edema.     Left lower leg: No edema.  Lymphadenopathy:     Cervical: No cervical adenopathy.  Skin:    General: Skin is warm and dry.     Capillary Refill: Capillary refill takes less than 2 seconds.     Coloration: Skin  is not jaundiced.  Neurological:     General: No focal deficit present.     Mental Status: She is alert and oriented to person, place, and time.  Psychiatric:        Mood and Affect: Mood normal.        Behavior: Behavior normal.    Results for orders placed or performed in visit on 10/18/22  POCT URINALYSIS DIP (CLINITEK)  Result Value Ref Range   Color, UA yellow yellow   Clarity, UA clear clear   Glucose, UA negative negative mg/dL   Bilirubin, UA negative negative   Ketones, POC UA negative negative mg/dL   Spec Grav, UA 1.020 1.010 - 1.025   Blood, UA small (A) negative   pH, UA 5.5 5.0 - 8.0   POC PROTEIN,UA negative negative, trace   Urobilinogen, UA 0.2 0.2 or 1.0 E.U./dL   Nitrite, UA Positive (A) Negative   Leukocytes, UA Small (1+) (A) Negative       Assessment &  Plan:   Problem List Items Addressed This Visit       Acute cystitis with hematuria    Presenting today for an acute visit for persistent UTI symptoms.  UA remains consistent with infection today.  She has previously going Klebsiella resistant to [place].  Most recently treated with Cipro 500 mg twice daily x 5 days.  Of note she has allergies to penicillin and sulfa drugs. -Retreat with Cipro 500 mg twice daily for an additional 5 days.   -Urine culture pending.  Will follow-up on susceptibilities. -She was instructed to return to care if her symptoms worsen or fail to improve.  Otherwise she is scheduled for follow-up with Dr. Moshe Cipro on 11/28/2022.      Meds ordered this encounter  Medications   ciprofloxacin (CIPRO) 500 MG tablet    Sig: Take 1 tablet (500 mg total) by mouth 2 (two) times daily for 10 doses.    Dispense:  10 tablet    Refill:  0    Return if symptoms worsen or fail to improve.  Johnette Abraham, MD

## 2022-10-18 NOTE — Patient Instructions (Signed)
It was a pleasure to see you today.  Thank you for giving [place] the opportunity to be involved in your care.  Below is a brief recap of your visit and next steps.  We will plan to see you again in April  Summary I have prescribed Cipro 500 mg twice daily x 5 days Follow up with Dr. Moshe Cipro in early April, but return to care if your symptoms are not improving.

## 2022-10-18 NOTE — Assessment & Plan Note (Signed)
Presenting today for an acute visit for persistent UTI symptoms.  UA remains consistent with infection today.  She has previously going Klebsiella resistant to [place].  Most recently treated with Cipro 500 mg twice daily x 5 days.  Of note she has allergies to penicillin and sulfa drugs. -Retreat with Cipro 500 mg twice daily for an additional 5 days.   -Urine culture pending.  Will follow-up on susceptibilities. -She was instructed to return to care if her symptoms worsen or fail to improve.  Otherwise she is scheduled for follow-up with Dr. Moshe Cipro on 11/28/2022.

## 2022-10-22 ENCOUNTER — Telehealth: Payer: Self-pay | Admitting: Family Medicine

## 2022-10-22 NOTE — Telephone Encounter (Signed)
She is allergic to multiple antibiotics, I recommend stay the course, check later in week with Dr Doren Custard re c/s report. Will need to see Urology if she has symptoms and has been adequately treated Pls let me know how this goes next week

## 2022-10-22 NOTE — Telephone Encounter (Signed)
Patient aware.

## 2022-10-22 NOTE — Telephone Encounter (Signed)
Not septra, she is allergic! Will get back to this

## 2022-10-22 NOTE — Telephone Encounter (Signed)
Medicine ciprofloxacin (CIPRO) 500 MG tablet [id number]   (prescribe by Dr Doren Custard) Is not working what else will work. Please return patient call.

## 2022-10-24 ENCOUNTER — Encounter: Payer: Self-pay | Admitting: Radiology

## 2022-10-25 ENCOUNTER — Other Ambulatory Visit: Payer: Self-pay | Admitting: Internal Medicine

## 2022-10-25 DIAGNOSIS — N3001 Acute cystitis with hematuria: Secondary | ICD-10-CM | POA: Diagnosis not present

## 2022-10-25 LAB — UA/M W/RFLX CULTURE, ROUTINE

## 2022-10-26 LAB — UA/M W/RFLX CULTURE, ROUTINE
Bilirubin, UA: NEGATIVE
Glucose, UA: NEGATIVE
Ketones, UA: NEGATIVE
Leukocytes,UA: NEGATIVE
Nitrite, UA: NEGATIVE
Protein,UA: NEGATIVE
RBC, UA: NEGATIVE
Specific Gravity, UA: 1.013 (ref 1.005–1.030)
Urobilinogen, Ur: 0.2 mg/dL (ref 0.2–1.0)
pH, UA: 6 (ref 5.0–7.5)

## 2022-10-26 LAB — MICROSCOPIC EXAMINATION
Bacteria, UA: NONE SEEN
Casts: NONE SEEN /lpf
Epithelial Cells (non renal): NONE SEEN /hpf (ref 0–10)
WBC, UA: NONE SEEN /hpf (ref 0–5)

## 2022-10-28 ENCOUNTER — Ambulatory Visit (HOSPITAL_COMMUNITY)
Admission: RE | Admit: 2022-10-28 | Discharge: 2022-10-28 | Disposition: A | Discharge status: Home / Self Care | Payer: PPO | Source: Ambulatory Visit | Attending: Family Medicine | Admitting: Family Medicine

## 2022-10-28 DIAGNOSIS — Z1231 Encounter for screening mammogram for malignant neoplasm of breast: Secondary | ICD-10-CM | POA: Insufficient documentation

## 2022-11-21 ENCOUNTER — Encounter: Payer: Self-pay | Admitting: Family Medicine

## 2022-11-21 ENCOUNTER — Ambulatory Visit (INDEPENDENT_AMBULATORY_CARE_PROVIDER_SITE_OTHER): Payer: PPO | Admitting: Family Medicine

## 2022-11-21 VITALS — Ht 62.0 in | Wt 185.0 lb

## 2022-11-21 DIAGNOSIS — Z0000 Encounter for general adult medical examination without abnormal findings: Secondary | ICD-10-CM | POA: Diagnosis not present

## 2022-11-21 NOTE — Progress Notes (Addendum)
Subjective:   Yvonne Brewer is a 77 y.o. female who presents for Medicare Annual (Subsequent) preventive examination. I connected with  Harl Bowie on 11/21/22 by a audio enabled telemedicine application and verified that I am speaking with the correct person using two identifiers.  Patient Location: Home  Provider Location: Office/Clinic  I discussed the limitations of evaluation and management by telemedicine. The patient expressed understanding and agreed to proceed.  Review of Systems    Patient denies pain, fever, chills, chest pain, palpations ,shortness of breath, blurred vision,cough, abdominal pain, nausea, vomiting, headache, dizziness. Patient is not feeling nervous or anxious. Cardiac Risk Factors include: advanced age (>17men, >80 women)     Objective:    Today's Vitals   11/21/22 1317  Weight: 185 lb (83.9 kg)  Height: 5\' 2"  (1.575 m)   Body mass index is 33.84 kg/m.     11/21/2022    1:24 PM 11/19/2021   11:36 AM 11/19/2021   11:35 AM 10/26/2021   12:44 PM 05/08/2021   10:32 AM 08/07/2020    1:25 PM 08/01/2020    2:16 PM  Advanced Directives  Does Patient Have a Medical Advance Directive? No Yes No No No No No  Type of Advance Directive  [place]       Does patient want to make changes to medical advance directive?  No - Patient declined       Copy of [place] in Chart?  No - copy requested       Would patient like information on creating a medical advance directive? No - Patient declined No - Patient declined No - Patient declined No - Patient declined No - Patient declined No - Patient declined No - Patient declined    Current Medications (verified) Outpatient Encounter Medications as of 11/21/2022  Medication Sig   amLODipine (NORVASC) 5 MG tablet Take 1 tablet (5 mg total) by mouth daily.   calcium elemental as carbonate (BARIATRIC TUMS ULTRA) 400 MG chewable tablet Chew 1,000 mg by mouth daily. Uses only as  needed   triamterene-hydrochlorothiazide (MAXZIDE-25) 37.5-25 MG tablet Take 1 tablet by mouth daily.   No facility-administered encounter medications on file as of 11/21/2022.    Allergies (verified) Ace inhibitors, Amlodipine, Penicillins, Statins, and Sulfonamide derivatives   History: Past Medical History:  Diagnosis Date   Essential hypertension 2015   Female bladder prolapse    History of cystitis    History of skin cancer    Hyperlipidemia    Obesity    Past Surgical History:  Procedure Laterality Date   CATARACT EXTRACTION  2012   bilateral    COLONOSCOPY  11/06/2011   Procedure: COLONOSCOPY;  Surgeon: Rogene Houston, MD;  Location: [facility];  Service: Endoscopy;  Laterality: N/A;  7:30   COLONOSCOPY N/A 05/21/2017   Procedure: COLONOSCOPY;  Surgeon: Rogene Houston, MD;  Location: [facility];  Service: Endoscopy;  Laterality: N/A;  730-moved to 9/26 @10 :30am per Lelon Frohlich   EYE SURGERY  2011   bilateral cataract with implants   HAMMER TOE SURGERY Left 01/2016   Left bunionectomy     Squamous cell ca rt ant chest 1989     YAG LASER APPLICATION Left [id number]   Procedure: YAG LASER APPLICATION;  Surgeon: Rutherford Guys, MD;  Location: [facility];  Service: Ophthalmology;  Laterality: Left;   YAG LASER APPLICATION Right [id number]   Procedure: YAG LASER APPLICATION;  Surgeon: Rutherford Guys, MD;  Location: [facility];  Service: Ophthalmology;  Laterality: Right;   Family History  Problem Relation Age of Onset   Stroke Mother    Hypertension Mother    Heart disease Father    Cancer Maternal Grandmother    Anesthesia problems Neg Hx    Hypotension Neg Hx    Malignant hyperthermia Neg Hx    Pseudochol deficiency Neg Hx    Social History   Socioeconomic History   Marital status: Married    Spouse name: Edison Nasuti    Number of children: 2   Years of education: 16   Highest education level: Master's degree (e.g., MA, MS, MEng, MEd, MSW, MBA)  Occupational History   Not on file   Tobacco Use   Smoking status: Never   Smokeless tobacco: Never  Vaping Use   Vaping Use: Never used  Substance and Sexual Activity   Alcohol use: Yes    Comment: glass of red wine most nights or scotch and water   Drug use: No   Sexual activity: Not Currently    Birth control/protection: Post-menopausal  Other Topics Concern   Not on file  Social History Narrative   Lives alone with  husband    Social Determinants of Health   Financial Resource Strain: Low Risk  (11/21/2022)   Overall Financial Resource Strain (CARDIA)    Difficulty of Paying Living Expenses: Not hard at all  Food Insecurity: No Food Insecurity (11/21/2022)   Hunger Vital Sign    Worried About Running Out of Food in the Last Year: Never true    Ran Out of Food in the Last Year: Never true  Transportation Needs: No Transportation Needs (11/21/2022)   PRAPARE - Hydrologist (Medical): No    Lack of Transportation (Non-Medical): No  Physical Activity: Sufficiently Active (11/21/2022)   Exercise Vital Sign    Days of Exercise per Week: 5 days    Minutes of Exercise per Session: 60 min  Stress: No Stress Concern Present (11/21/2022)   [place]    Feeling of Stress : Not at all  Social Connections: Success (11/21/2022)   Social Connection and Isolation Panel [NHANES]    Frequency of Communication with Friends and Family: Twice a week    Frequency of Social Gatherings with Friends and Family: Twice a week    Attends Religious Services: More than 4 times per year    Active Member of [employer] or Organizations: Yes    Attends Music therapist: More than 4 times per year    Marital Status: Married    Tobacco Counseling Counseling given: Not Answered   Clinical Intake:  Pre-visit preparation completed: No  Pain : 0-10 Pain Location: Arm Pain Orientation: Right     BMI - recorded:  33.84 Nutritional Status: BMI > 30  Obese Diabetes: No  How often do you need to have someone help you when you read instructions, pamphlets, or other written materials from your doctor or pharmacy?: 1 - Never  Diabetic? No Hemoglobin A1c 5.8  Interpreter Needed?: No      Activities of Daily Living    11/21/2022    1:25 PM  In your present state of health, do you have any difficulty performing the following activities:  Hearing? 0  Vision? 0  Difficulty concentrating or making decisions? 0  Walking or climbing stairs? 0  Dressing or bathing? 0  Doing errands, shopping? 0  Preparing  Food and eating ? N  Using the Toilet? N  In the past six months, have you accidently leaked urine? N  Do you have problems with loss of bowel control? N  Managing your Medications? N  Managing your Finances? N  Housekeeping or managing your Housekeeping? N    Patient Care Team: Fayrene Helper, MD as PCP - General Allyn Kenner, MD (Dermatology) Rogene Houston, MD as Consulting Physician (Gastroenterology) Sanjuana Kava, MD as Consulting Physician (Orthopedic Surgery) Rutherford Guys, MD as Consulting Physician (Ophthalmology) Wylene Simmer, MD as Consulting Physician (Orthopedic Surgery)  Indicate any recent Medical Services you may have received from other than [facility] providers in the past year (date may be approximate).     Assessment:   This is a routine wellness examination for [facility].  Hearing/Vision screen No results found.  Dietary issues and exercise activities discussed:   Advise lifestyle modifications follow diet low in saturated fat, reduce dietary salt intake, avoid fatty foods, maintain an exercise routine 3 to 5 days a week for a minimum total of 150 minutes.   Goals Addressed             This Visit's Progress    Develop a Weight Loss Readiness Plan       Timeframe:  Long-Range Goal Priority:  Medium Start Date:                             Expected End Date:                        Follow Up Date 11/20/2022   - get rid of junk food - list ways to deal with barriers to success    Why is this important?   Losing weight requires time to prepare your mind and your home.  Identify your eating habits and the emotions attached to eating.  Think about ways to be successful.  When you are not ready, you could lose motivation and give up on your plan.     Notes:        Depression Screen    11/21/2022    1:23 PM 10/18/2022    1:48 PM 10/03/2022   11:04 AM 07/04/2022   10:11 AM 06/27/2022    2:01 PM 04/24/2022   10:59 AM 03/05/2022    2:49 PM  PHQ 2/9 Scores  PHQ - 2 Score 1 0 0 0 0 0 0  PHQ- 9 Score  0 2        Fall Risk    11/21/2022    1:24 PM 10/18/2022    1:48 PM 10/03/2022   11:04 AM 07/04/2022   10:11 AM 06/27/2022    2:00 PM  [place] in the past year? 0 0 0 0 0  Number falls in past yr: 0 0 0 0 0  Injury with Fall? 0 0 0 0 0  Risk for fall due to : No Fall Risks No Fall Risks No Fall Risks No Fall Risks No Fall Risks  Follow up Falls evaluation completed Falls evaluation completed Falls evaluation completed Falls evaluation completed Falls evaluation completed    FALL RISK PREVENTION PERTAINING TO THE HOME:  Any stairs in or around the home? Yes  If so, are there any without handrails? No  Home free of loose throw rugs in walkways, pet beds, electrical cords, etc? Yes  Adequate lighting in your home  to reduce risk of falls? Yes   ASSISTIVE DEVICES UTILIZED TO PREVENT FALLS:  Life alert? No  Use of a cane, walker or w/c? No  Grab bars in the bathroom? No  Shower chair or bench in shower? Yes  Elevated toilet seat or a handicapped toilet? No     Cognitive Function:    07/29/2016    9:44 AM  MMSE - Mini Mental State Exam  Orientation to time 5  Orientation to Place 5  Registration 3  Attention/ Calculation 5  Recall 3  Language- name 2 objects 2  Language- repeat 1  Language- follow 3 step command 3  Language-  read & follow direction 1  Write a sentence 1  Copy design 1  Total score 30        11/21/2022    1:25 PM 11/19/2021   11:43 AM 08/15/2020   10:56 AM 08/12/2019    8:52 AM 08/10/2018    8:37 AM  6CIT Screen  What Year?  0 points 0 points 0 points 0 points  What month?  0 points 0 points 0 points 0 points  What time? 0 points 0 points 0 points 0 points 0 points  Count back from 20 0 points 0 points 0 points 0 points 0 points  Months in reverse 0 points 0 points 0 points 0 points 0 points  Repeat phrase 0 points 2 points 0 points 0 points 2 points  Total Score  2 points 0 points 0 points 2 points    Immunizations Immunization History  Administered Date(s) Administered   Fluad Quad(high Dose 65+) 04/19/2019, 06/01/2020   Hep A / Hep B 03/09/2018, 03/09/2018   Hepatitis A 12/29/2002, 09/20/2003   Influenza Split 05/23/2014   Influenza Whole 10/13/2008, 06/06/2009   Influenza, High Dose Seasonal PF 05/02/2021   Influenza-Unspecified 05/16/2017, 05/26/2018, 06/08/2022   Moderna Covid-19 Vaccine Bivalent Booster 49yrs & up 05/21/2022   PFIZER(Purple Top)SARS-COV-2 Vaccination 09/16/2019, 10/07/2019, 05/23/2020   Pneumococcal Conjugate-13 09/06/2014   Pneumococcal Polysaccharide-23 12/31/2010   Rsv, Bivalent, Protein Subunit Rsvpref,pf (Abrysvo) 08/30/2022   Td 12/15/2002   Typhoid Live 03/09/2018, 03/09/2018   Zoster Recombinat (Shingrix) 07/09/2019, 07/09/2019, 11/18/2019   Zoster, Live 09/01/2006    TDAP status: Due, Education has been provided regarding the importance of this vaccine. Advised may receive this vaccine at local pharmacy or Health Dept. Aware to provide a copy of the vaccination record if obtained from local pharmacy or Health Dept. Verbalized acceptance and understanding.  Flu Vaccine status: Up to date  Pneumococcal vaccine status: Up to date  Covid-19 vaccine status: Information provided on how to obtain vaccines.   Qualifies for Shingles Vaccine? Yes    Zostavax completed Yes   Shingrix Completed?: Yes  Screening Tests Health Maintenance  Topic Date Due   DTaP/Tdap/Td (2 - Tdap) 12/14/2012   Medicare Annual Wellness (AWV)  11/20/2022   COVID-19 Vaccine (5 - 2023-24 season) 12/07/2022 (Originally 07/16/2022)   COLONOSCOPY (Pts 45-8yrs Insurance coverage will need to be confirmed)  05/21/2024   Pneumonia Vaccine 14+ Years old  Completed   INFLUENZA VACCINE  Completed   DEXA SCAN  Completed   Hepatitis C Screening  Completed   Zoster Vaccines- Shingrix  Completed   HPV VACCINES  Aged Out    Health Maintenance  Health Maintenance Due  Topic Date Due   DTaP/Tdap/Td (2 - Tdap) 12/14/2012   Medicare Annual Wellness (AWV)  11/20/2022    Colorectal cancer screening: Type of screening: Colonoscopy. Completed  2018. Repeat every 7 years  Mammogram status: Completed 10/2022. Repeat every year  Bone Density status: Completed 2019. Results reflect: Bone density results: NORMAL. Repeat every   years.  Lung Cancer Screening: (Low Dose CT Chest recommended if Age 46-80 years, 30 pack-year currently smoking OR have quit w/in 15years.) does not qualify.   Lung Cancer Screening Referral: N/A  Additional Screening:  Hepatitis C Screening: does not qualify; Completed 2016 Negative  Vision Screening: Recommended annual ophthalmology exams for early detection of glaucoma and other disorders of the eye. Is the patient up to date with their annual eye exam?  Yes  Who is the provider or what is the name of the office in which the patient attends annual eye exams? "[email or web address]" If pt is not established with a provider, would they like to be referred to a provider to establish care? No .   Dental Screening: Recommended annual dental exams for proper oral hygiene  Community Resource Referral / Chronic Care Management: CRR required this visit?  No   CCM required this visit?  No      Plan:     I have personally reviewed and noted the following  in the patient's chart:   Medical and social history Use of alcohol, tobacco or illicit drugs  Current medications and supplements including opioid prescriptions. Patient is not currently taking opioid prescriptions. Functional ability and status Nutritional status Physical activity Advanced directives List of other physicians Hospitalizations, surgeries, and ER visits in previous 12 months Vitals Screenings to include cognitive, depression, and falls Referrals and appointments  In addition, I have reviewed and discussed with patient certain preventive protocols, quality metrics, and best practice recommendations. A written personalized care plan for preventive services as well as general preventive health recommendations were provided to patient.     [place], [place]   11/21/2022

## 2022-11-26 ENCOUNTER — Ambulatory Visit (INDEPENDENT_AMBULATORY_CARE_PROVIDER_SITE_OTHER): Payer: PPO | Admitting: Orthopaedic Surgery

## 2022-11-26 ENCOUNTER — Encounter: Payer: Self-pay | Admitting: Orthopaedic Surgery

## 2022-11-26 ENCOUNTER — Other Ambulatory Visit (INDEPENDENT_AMBULATORY_CARE_PROVIDER_SITE_OTHER): Payer: PPO

## 2022-11-26 VITALS — BP 128/60 | HR 79 | Ht 62.0 in | Wt 189.0 lb

## 2022-11-26 DIAGNOSIS — G8929 Other chronic pain: Secondary | ICD-10-CM

## 2022-11-26 DIAGNOSIS — M25511 Pain in right shoulder: Secondary | ICD-10-CM

## 2022-11-26 DIAGNOSIS — Z1283 Encounter for screening for malignant neoplasm of skin: Secondary | ICD-10-CM | POA: Diagnosis not present

## 2022-11-26 DIAGNOSIS — D225 Melanocytic nevi of trunk: Secondary | ICD-10-CM | POA: Diagnosis not present

## 2022-11-26 DIAGNOSIS — C44311 Basal cell carcinoma of skin of nose: Secondary | ICD-10-CM | POA: Diagnosis not present

## 2022-11-26 NOTE — Patient Instructions (Addendum)
Use Aspercreme, Biofreeze or Voltaren gel over the counter 2-3 times daily make sure you rub it in well each time you use it.   Physical therapy has been ordered for you at [facility] They should call you to schedule, [phone]  is the phone number to call, if you want to call to schedule.

## 2022-11-26 NOTE — Progress Notes (Signed)
My right shoulder is hurting.  She has developed pain in the right shoulder over the last few months getting slowly worse. She has no trauma.  She has pain with elevating her hand over head and externally rotating the shoulder.  She has no numbness, no swelling, no redness.  She has tried a rub and ice.  She has heartburn and cannot take NSAIDs.  Right shoulder is tender.  ROM is forward 120, abduction 90 with pain, internal 30, external 20, extension 10, adduction full.  NV intact.  Grips normal. ROM neck is full.  X-rays were done of the right shoulder, reported separately.  Encounter Diagnosis  Name Primary?   Chronic right shoulder pain Yes   She would prefer not to have an injection today.  I will set up PT at [facility].  Return in two weeks.  Continue the rubs.  Call if any problem.  Precautions discussed.  Electronically Signed Sanjuana Kava, MD 4/2/202410:49 AM

## 2022-11-28 ENCOUNTER — Ambulatory Visit (INDEPENDENT_AMBULATORY_CARE_PROVIDER_SITE_OTHER): Payer: PPO | Admitting: Family Medicine

## 2022-11-28 ENCOUNTER — Encounter: Payer: Self-pay | Admitting: Family Medicine

## 2022-11-28 ENCOUNTER — Other Ambulatory Visit: Payer: Self-pay

## 2022-11-28 VITALS — BP 126/81 | HR 76 | Ht 62.0 in | Wt 188.1 lb

## 2022-11-28 DIAGNOSIS — N390 Urinary tract infection, site not specified: Secondary | ICD-10-CM

## 2022-11-28 DIAGNOSIS — R7302 Impaired glucose tolerance (oral): Secondary | ICD-10-CM

## 2022-11-28 DIAGNOSIS — I10 Essential (primary) hypertension: Secondary | ICD-10-CM

## 2022-11-28 DIAGNOSIS — R300 Dysuria: Secondary | ICD-10-CM | POA: Diagnosis not present

## 2022-11-28 DIAGNOSIS — E785 Hyperlipidemia, unspecified: Secondary | ICD-10-CM

## 2022-11-28 LAB — POCT URINALYSIS DIP (CLINITEK)
Bilirubin, UA: NEGATIVE
Glucose, UA: NEGATIVE mg/dL
Ketones, POC UA: NEGATIVE mg/dL
Nitrite, UA: POSITIVE — AB
Spec Grav, UA: 1.02 (ref 1.010–1.025)
Urobilinogen, UA: 0.2 E.U./dL
pH, UA: 6 (ref 5.0–8.0)

## 2022-11-28 MED ORDER — AMLODIPINE BESYLATE 5 MG PO TABS: 5.0000 mg | ORAL_TABLET | Freq: Every day | ORAL | 3 refills | Status: DC

## 2022-11-28 MED ORDER — NITROFURANTOIN MONOHYD MACRO 100 MG PO CAPS: 100.0000 mg | ORAL_CAPSULE | Freq: Two times a day (BID) | ORAL | 0 refills | Status: DC

## 2022-11-28 NOTE — Patient Instructions (Addendum)
F/U in 3 to 4  months, call if you need me sooner  PLEASE bring medication for review and data entry, show Nurse Katie  1 week of antibiotic is prescribed and you are referred to Urology  Fasting lipid, cmp and EGFr, hBA1C, 3 to 5 days before next appt  Thanks for choosing [facility], we consider it a privelige to serve you.

## 2022-12-03 LAB — URINE CULTURE

## 2022-12-03 MED ORDER — NITROFURANTOIN MACROCRYSTAL 50 MG PO CAPS: ORAL_CAPSULE | ORAL | 1 refills | Status: DC

## 2022-12-03 NOTE — Progress Notes (Signed)
Just response to la ordered , no charge

## 2022-12-10 ENCOUNTER — Ambulatory Visit: Payer: PPO | Admitting: Orthopaedic Surgery

## 2022-12-20 ENCOUNTER — Encounter (INDEPENDENT_AMBULATORY_CARE_PROVIDER_SITE_OTHER): Payer: Self-pay | Admitting: *Deleted

## 2022-12-24 ENCOUNTER — Ambulatory Visit: Payer: PPO | Admitting: Urology

## 2022-12-31 DIAGNOSIS — M25511 Pain in right shoulder: Secondary | ICD-10-CM | POA: Diagnosis not present

## 2022-12-31 DIAGNOSIS — M6281 Muscle weakness (generalized): Secondary | ICD-10-CM | POA: Diagnosis not present

## 2022-12-31 DIAGNOSIS — M25611 Stiffness of right shoulder, not elsewhere classified: Secondary | ICD-10-CM | POA: Diagnosis not present

## 2023-01-02 DIAGNOSIS — M25611 Stiffness of right shoulder, not elsewhere classified: Secondary | ICD-10-CM | POA: Diagnosis not present

## 2023-01-02 DIAGNOSIS — M25511 Pain in right shoulder: Secondary | ICD-10-CM | POA: Diagnosis not present

## 2023-01-02 DIAGNOSIS — M6281 Muscle weakness (generalized): Secondary | ICD-10-CM | POA: Diagnosis not present

## 2023-01-07 DIAGNOSIS — Z85828 Personal history of other malignant neoplasm of skin: Secondary | ICD-10-CM | POA: Diagnosis not present

## 2023-01-07 DIAGNOSIS — M6281 Muscle weakness (generalized): Secondary | ICD-10-CM | POA: Diagnosis not present

## 2023-01-07 DIAGNOSIS — M25611 Stiffness of right shoulder, not elsewhere classified: Secondary | ICD-10-CM | POA: Diagnosis not present

## 2023-01-07 DIAGNOSIS — L821 Other seborrheic keratosis: Secondary | ICD-10-CM | POA: Diagnosis not present

## 2023-01-07 DIAGNOSIS — Z08 Encounter for follow-up examination after completed treatment for malignant neoplasm: Secondary | ICD-10-CM | POA: Diagnosis not present

## 2023-01-07 DIAGNOSIS — M25511 Pain in right shoulder: Secondary | ICD-10-CM | POA: Diagnosis not present

## 2023-01-09 DIAGNOSIS — M25511 Pain in right shoulder: Secondary | ICD-10-CM | POA: Diagnosis not present

## 2023-01-09 DIAGNOSIS — M6281 Muscle weakness (generalized): Secondary | ICD-10-CM | POA: Diagnosis not present

## 2023-01-09 DIAGNOSIS — M25611 Stiffness of right shoulder, not elsewhere classified: Secondary | ICD-10-CM | POA: Diagnosis not present

## 2023-01-14 DIAGNOSIS — M25611 Stiffness of right shoulder, not elsewhere classified: Secondary | ICD-10-CM | POA: Diagnosis not present

## 2023-01-14 DIAGNOSIS — M6281 Muscle weakness (generalized): Secondary | ICD-10-CM | POA: Diagnosis not present

## 2023-01-14 DIAGNOSIS — M25511 Pain in right shoulder: Secondary | ICD-10-CM | POA: Diagnosis not present

## 2023-01-21 DIAGNOSIS — M25611 Stiffness of right shoulder, not elsewhere classified: Secondary | ICD-10-CM | POA: Diagnosis not present

## 2023-01-21 DIAGNOSIS — M25511 Pain in right shoulder: Secondary | ICD-10-CM | POA: Diagnosis not present

## 2023-01-21 DIAGNOSIS — M6281 Muscle weakness (generalized): Secondary | ICD-10-CM | POA: Diagnosis not present

## 2023-01-23 DIAGNOSIS — M25611 Stiffness of right shoulder, not elsewhere classified: Secondary | ICD-10-CM | POA: Diagnosis not present

## 2023-01-23 DIAGNOSIS — M25511 Pain in right shoulder: Secondary | ICD-10-CM | POA: Diagnosis not present

## 2023-01-23 DIAGNOSIS — M6281 Muscle weakness (generalized): Secondary | ICD-10-CM | POA: Diagnosis not present

## 2023-01-31 DIAGNOSIS — M25511 Pain in right shoulder: Secondary | ICD-10-CM | POA: Diagnosis not present

## 2023-01-31 DIAGNOSIS — M25611 Stiffness of right shoulder, not elsewhere classified: Secondary | ICD-10-CM | POA: Diagnosis not present

## 2023-01-31 DIAGNOSIS — M6281 Muscle weakness (generalized): Secondary | ICD-10-CM | POA: Diagnosis not present

## 2023-02-04 ENCOUNTER — Ambulatory Visit: Payer: PPO | Admitting: Urology

## 2023-02-04 DIAGNOSIS — M25511 Pain in right shoulder: Secondary | ICD-10-CM | POA: Diagnosis not present

## 2023-02-04 DIAGNOSIS — M25611 Stiffness of right shoulder, not elsewhere classified: Secondary | ICD-10-CM | POA: Diagnosis not present

## 2023-02-04 DIAGNOSIS — M6281 Muscle weakness (generalized): Secondary | ICD-10-CM | POA: Diagnosis not present

## 2023-02-06 DIAGNOSIS — M25511 Pain in right shoulder: Secondary | ICD-10-CM | POA: Diagnosis not present

## 2023-02-06 DIAGNOSIS — M6281 Muscle weakness (generalized): Secondary | ICD-10-CM | POA: Diagnosis not present

## 2023-02-06 DIAGNOSIS — M25611 Stiffness of right shoulder, not elsewhere classified: Secondary | ICD-10-CM | POA: Diagnosis not present

## 2023-02-11 DIAGNOSIS — M25511 Pain in right shoulder: Secondary | ICD-10-CM | POA: Diagnosis not present

## 2023-02-11 DIAGNOSIS — M25611 Stiffness of right shoulder, not elsewhere classified: Secondary | ICD-10-CM | POA: Diagnosis not present

## 2023-02-11 DIAGNOSIS — M6281 Muscle weakness (generalized): Secondary | ICD-10-CM | POA: Diagnosis not present

## 2023-02-13 DIAGNOSIS — M6281 Muscle weakness (generalized): Secondary | ICD-10-CM | POA: Diagnosis not present

## 2023-02-13 DIAGNOSIS — M25511 Pain in right shoulder: Secondary | ICD-10-CM | POA: Diagnosis not present

## 2023-02-13 DIAGNOSIS — M25611 Stiffness of right shoulder, not elsewhere classified: Secondary | ICD-10-CM | POA: Diagnosis not present

## 2023-02-18 ENCOUNTER — Ambulatory Visit (INDEPENDENT_AMBULATORY_CARE_PROVIDER_SITE_OTHER): Payer: PPO | Admitting: Gastroenterology

## 2023-03-07 ENCOUNTER — Ambulatory Visit
Admission: EM | Admit: 2023-03-07 | Discharge: 2023-03-07 | Disposition: A | Discharge status: Home / Self Care | Payer: PPO | Attending: Family Medicine | Admitting: Family Medicine

## 2023-03-07 DIAGNOSIS — Z1152 Encounter for screening for COVID-19: Secondary | ICD-10-CM | POA: Insufficient documentation

## 2023-03-07 DIAGNOSIS — J069 Acute upper respiratory infection, unspecified: Secondary | ICD-10-CM | POA: Insufficient documentation

## 2023-03-07 DIAGNOSIS — R519 Headache, unspecified: Secondary | ICD-10-CM | POA: Diagnosis present

## 2023-03-07 DIAGNOSIS — R059 Cough, unspecified: Secondary | ICD-10-CM | POA: Diagnosis present

## 2023-03-07 MED ORDER — PROMETHAZINE-DM 6.25-15 MG/5ML PO SYRP: 5.0000 mL | ORAL_SOLUTION | Freq: Four times a day (QID) | ORAL | 0 refills | Status: DC | PRN

## 2023-03-07 MED ORDER — FLUTICASONE PROPIONATE 50 MCG/ACT NA SUSP: 1.0000 | Freq: Two times a day (BID) | NASAL | 2 refills | Status: DC

## 2023-03-07 NOTE — ED Provider Notes (Signed)
[facility]    CSN: [id number] Arrival date & time: 03/07/23  0907      History   Chief Complaint No chief complaint on file.   HPI Yvonne Brewer is a 77 y.o. female.   Patient presenting today with 1 day history of cough, congestion, headache, postnasal drainage.  Denies fever, chills, chest pain, shortness of breath, abdominal pain, nausea vomiting or diarrhea.  So far tried Mucinex with minimal relief.  No known sick contacts recently.  No known history of chronic pulmonary disease.    Past Medical History:  Diagnosis Date   Essential hypertension 2015   Female bladder prolapse    History of cystitis    History of skin cancer    Hyperlipidemia    Obesity     Patient Active Problem List   Diagnosis Date Noted   Viral URI with cough 07/10/2022   IGT (impaired glucose tolerance) 04/24/2022   Abnormal EKG 03/12/2022   Varicose veins of leg with swelling 03/18/2021   Left shoulder pain 07/10/2020   Heartburn 07/07/2019   Fracture, foot, left, sequela 02/09/2018   Dyspepsia 04/18/2017   Osteoarthritis of both hips 04/18/2017   History of colonic polyps 11/13/2016   Pseudophakia 11/12/2016   Urinary incontinence in female 07/29/2016   Environmental allergies 07/14/2016   Annual visit for general adult medical examination with abnormal findings 02/21/2016   Chronic radicular pain of lower back 03/13/2014   Reduced vision 03/13/2014   Acute cystitis with hematuria 03/08/2014   Essential hypertension 03/08/2014   Morbid obesity (HCC) 02/19/2010   Vitamin D deficiency 07/27/2009   Hyperlipidemia LDL goal <100 02/19/2008   Disorder of bladder 02/19/2008    Past Surgical History:  Procedure Laterality Date   CATARACT EXTRACTION  2012   bilateral    COLONOSCOPY  11/06/2011   Procedure: COLONOSCOPY;  Surgeon: Malissa Hippo, MD;  Location: [facility];  Service: Endoscopy;  Laterality: N/A;  7:30   COLONOSCOPY N/A 05/21/2017   Procedure:  COLONOSCOPY;  Surgeon: Malissa Hippo, MD;  Location: [facility];  Service: Endoscopy;  Laterality: N/A;  730-moved to 9/26 @10 :30am per Dewayne Hatch   EYE SURGERY  2011   bilateral cataract with implants   HAMMER TOE SURGERY Left 01/2016   Left bunionectomy     Squamous cell ca rt ant chest 1989     YAG LASER APPLICATION Left 10/24/2015   Procedure: YAG LASER APPLICATION;  Surgeon: Jethro Bolus, MD;  Location: [facility];  Service: Ophthalmology;  Laterality: Left;   YAG LASER APPLICATION Right 11/07/2015   Procedure: YAG LASER APPLICATION;  Surgeon: Jethro Bolus, MD;  Location: [facility];  Service: Ophthalmology;  Laterality: Right;    OB History   No obstetric history on file.      Home Medications    Prior to Admission medications   Medication Sig Start Date End Date Taking? Authorizing Provider  fluticasone (FLONASE) 50 MCG/ACT nasal spray Place 1 spray into both nostrils 2 (two) times daily. 03/07/23  Yes Particia Nearing, PA-C  promethazine-dextromethorphan (PROMETHAZINE-DM) 6.25-15 MG/5ML syrup Take 5 mLs by mouth 4 (four) times daily as needed. 03/07/23  Yes Particia Nearing, PA-C  amLODipine (NORVASC) 5 MG tablet Take 1 tablet (5 mg total) by mouth daily. 11/28/22   Kerri Perches, MD  calcium elemental as carbonate (BARIATRIC TUMS ULTRA) 400 MG chewable tablet Chew 1,000 mg by mouth daily. Uses only as needed    [provider]  nitrofurantoin (  MACRODANTIN) 50 MG capsule Take one capsule by mouth once daily 12/16/22   Kerri Perches, MD  nitrofurantoin, macrocrystal-monohydrate, (MACROBID) 100 MG capsule Take 1 capsule (100 mg total) by mouth 2 (two) times daily. 11/28/22   Kerri Perches, MD  triamterene-hydrochlorothiazide (MAXZIDE-25) 37.5-25 MG tablet Take 1 tablet by mouth daily. 07/15/22   Kerri Perches, MD    Family History Family History  Problem Relation Age of Onset   Stroke Mother    Hypertension Mother    Heart disease Father    Cancer  Maternal Grandmother    Anesthesia problems Neg Hx    Hypotension Neg Hx    Malignant hyperthermia Neg Hx    Pseudochol deficiency Neg Hx     Social History Social History   Tobacco Use   Smoking status: Never   Smokeless tobacco: Never  Vaping Use   Vaping status: Never Used  Substance Use Topics   Alcohol use: Yes    Comment: glass of red wine most nights or scotch and water   Drug use: No     Allergies   Ace inhibitors, Amlodipine, Penicillins, Statins, and Sulfonamide derivatives   Review of Systems Review of Systems Per HPI  Physical Exam Triage Vital Signs ED Triage Vitals  Encounter Vitals Group     BP 03/07/23 0912 122/72     Systolic BP Percentile --      Diastolic BP Percentile --      Pulse Rate 03/07/23 0912 87     Resp 03/07/23 0912 13     Temp 03/07/23 0912 98.4 F (36.9 C)     Temp Source 03/07/23 0912 Oral     SpO2 03/07/23 0912 95 %     Weight --      Height --      Head Circumference --      Peak Flow --      Pain Score 03/07/23 0915 7     Pain Loc --      Pain Education --      Exclude from Growth Chart --    No data found.  Updated Vital Signs BP 122/72 (BP Location: Right Arm)   Pulse 87   Temp 98.4 F (36.9 C) (Oral)   Resp 13   SpO2 95%   Visual Acuity Right Eye Distance:   Left Eye Distance:   Bilateral Distance:    Right Eye Near:   Left Eye Near:    Bilateral Near:     Physical Exam Vitals and nursing note reviewed.  Constitutional:      Appearance: Normal appearance. She is not ill-appearing.  HENT:     Head: Atraumatic.     Right Ear: Tympanic membrane and external ear normal.     Left Ear: Tympanic membrane and external ear normal.     Nose: Rhinorrhea present.     Mouth/Throat:     Mouth: Mucous membranes are moist.     Pharynx: Posterior oropharyngeal erythema present.  Eyes:     Extraocular Movements: Extraocular movements intact.     Conjunctiva/sclera: Conjunctivae normal.  Cardiovascular:      Rate and Rhythm: Normal rate and regular rhythm.     Heart sounds: Normal heart sounds.  Pulmonary:     Effort: Pulmonary effort is normal.     Breath sounds: Normal breath sounds. No wheezing or rales.  Musculoskeletal:        General: Normal range of motion.     Cervical back: Normal  range of motion and neck supple.  Skin:    General: Skin is warm and dry.  Neurological:     Mental Status: She is alert and oriented to person, place, and time.  Psychiatric:        Mood and Affect: Mood normal.        Thought Content: Thought content normal.        Judgment: Judgment normal.      UC Treatments / Results  Labs (all labs ordered are listed, but only abnormal results are displayed) Labs Reviewed  SARS CORONAVIRUS 2 (TAT 6-24 HRS)    EKG   Radiology No results found.  Procedures Procedures (including critical care time)  Medications Ordered in UC Medications - No data to display  Initial Impression / Assessment and Plan / UC Course  I have reviewed the triage vital signs and the nursing notes.  Pertinent labs & imaging results that were available during my care of the patient were reviewed by me and considered in my medical decision making (see chart for details).     Vital signs and exam reassuring today and suggestive of viral upper respiratory infection.  COVID testing pending, good candidate for antiviral therapy if positive.  Treat with Phenergan DM, Flonase, supportive over-the-counter medications and home care.  Return for worsening symptoms.  Final Clinical Impressions(s) / UC Diagnoses   Final diagnoses:  Viral URI with cough     Discharge Instructions      We will be in touch tomorrow if your COVID results are positive.  I have sent over some medications to help with your symptoms and you may take good over-the-counter remedies additionally.  Follow up if you are significantly worsening in any time    ED Prescriptions     Medication Sig Dispense  Auth. Provider   promethazine-dextromethorphan (PROMETHAZINE-DM) 6.25-15 MG/5ML syrup Take 5 mLs by mouth 4 (four) times daily as needed. 100 mL Particia Nearing, PA-C   fluticasone [facility]) 50 MCG/ACT nasal spray Place 1 spray into both nostrils 2 (two) times daily. 16 g Particia Nearing, [place]      PDMP not reviewed this encounter.   Particia Nearing, [place] 03/07/23 1939

## 2023-03-07 NOTE — Discharge Instructions (Signed)
We will be in touch tomorrow if your COVID results are positive.  I have sent over some medications to help with your symptoms and you may take good over-the-counter remedies additionally.  Follow up if you are significantly worsening in any time

## 2023-03-07 NOTE — ED Triage Notes (Signed)
Pt c/o cough and congestion, headache, some post nasal drip, x 1 day

## 2023-03-08 LAB — SARS CORONAVIRUS 2 (TAT 6-24 HRS): SARS Coronavirus 2: NEGATIVE

## 2023-03-10 ENCOUNTER — Encounter: Payer: Self-pay | Admitting: Internal Medicine

## 2023-03-10 ENCOUNTER — Ambulatory Visit: Payer: PPO | Admitting: Internal Medicine

## 2023-03-10 VITALS — BP 134/70 | HR 89 | Temp 98.9°F | Resp 16 | Ht 62.0 in | Wt 190.0 lb

## 2023-03-10 DIAGNOSIS — J069 Acute upper respiratory infection, unspecified: Secondary | ICD-10-CM

## 2023-03-10 NOTE — Patient Instructions (Addendum)
Thank you, Ms.Roxy Manns for allowing [place] to provide your care today.   Continue your cough medicine. I recommend gargling with hot salt water and nasal rinses. If your condition worsens let me know and I will send you antibiotic.    Your next appointment is 7/25 with Dr.Simpson   Thurmon Fair, M.D.

## 2023-03-10 NOTE — Progress Notes (Unsigned)
   HPI:Yvonne Brewer is a 77 y.o. female who presents for evaluation of Cough (Non productive cough, sore throat x 1 week. Was seen at urgent care on 7/12 and was given flonase and cough syrup. Covid test negative) . For the details of today's visit, please refer to the assessment and plan.    Physical Exam: Vitals:   03/10/23 1458  BP: 134/70  Pulse: 89  Resp: 16  Temp: 98.9 F (37.2 C)  TempSrc: Oral  SpO2: 95%  Weight: 190 lb (86.2 kg)  Height: 5\' 2"  (1.575 m)     Physical Exam HENT:     Right Ear: Tympanic membrane and ear canal normal.     Left Ear: There is impacted cerumen.     Nose: Congestion present.     Mouth/Throat:     Pharynx: Posterior oropharyngeal erythema present. No oropharyngeal exudate.  Cardiovascular:     Rate and Rhythm: Normal rate and regular rhythm.     Heart sounds: No murmur heard. Pulmonary:     Effort: Pulmonary effort is normal.     Breath sounds: No wheezing or rales.      Assessment & Plan:   Yvonne Brewer was seen today for cough.  Viral URI with cough Assessment & Plan: Minimal , resolving Patient present for follow up from urgent care visit. She is feeling better,but continuing to have some cough. Has some impacted cerumen in left ear, nurse will perform lavage.  Recommend continuing your cough medicine. I recommend gargling with hot salt water and nasal rinses. If her condition doesn't continue to improve or worsen patient will follow up for antibiotic.       Milus Banister, MD

## 2023-03-11 NOTE — Assessment & Plan Note (Addendum)
Minimal , resolving Patient present for follow up from urgent care visit. She is feeling better,but continuing to have some cough. Has some impacted cerumen in left ear, nurse will perform lavage.  Recommend continuing your cough medicine. I recommend gargling with hot salt water and nasal rinses. If her condition doesn't continue to improve or worsen patient will follow up for antibiotic.

## 2023-03-20 ENCOUNTER — Encounter: Payer: Self-pay | Admitting: Family Medicine

## 2023-03-20 ENCOUNTER — Ambulatory Visit (INDEPENDENT_AMBULATORY_CARE_PROVIDER_SITE_OTHER): Payer: PPO | Admitting: Family Medicine

## 2023-03-20 VITALS — BP 144/92 | HR 90 | Ht 62.0 in | Wt 188.1 lb

## 2023-03-20 DIAGNOSIS — E785 Hyperlipidemia, unspecified: Secondary | ICD-10-CM

## 2023-03-20 DIAGNOSIS — J029 Acute pharyngitis, unspecified: Secondary | ICD-10-CM

## 2023-03-20 DIAGNOSIS — I10 Essential (primary) hypertension: Secondary | ICD-10-CM

## 2023-03-20 DIAGNOSIS — R7302 Impaired glucose tolerance (oral): Secondary | ICD-10-CM

## 2023-03-20 MED ORDER — AMLODIPINE BESYLATE 2.5 MG PO TABS: 2.5000 mg | ORAL_TABLET | Freq: Every day | ORAL | 3 refills | Status: DC

## 2023-03-20 NOTE — Patient Instructions (Signed)
F/u in 8 weeks, re evaluate blood pressure, call if you need me sooner  New additional amlodipine 2.5 mg tablet, take every day with the 5 mg tablet for a total  daily dose of 7.5 mg   Please get fasting labs tomorrow  If you are not contacted next week Tuesday  with results pls call and request them  It is important that you exercise regularly at least 30 minutes 5 times a week. If you develop chest pain, have severe difficulty breathing, or feel very tired, stop exercising immediately and seek medical attention    Thanks for choosing [facility], we consider it a privelige to serve you.

## 2023-03-21 DIAGNOSIS — J029 Acute pharyngitis, unspecified: Secondary | ICD-10-CM | POA: Insufficient documentation

## 2023-03-21 NOTE — Assessment & Plan Note (Signed)
Hyperlipidemia:Low fat diet discussed and encouraged.   Lipid Panel  Lab Results  Component Value Date   CHOL 202 (H) 10/03/2022   HDL 53 10/03/2022   LDLCALC 126 (H) 10/03/2022   TRIG 130 10/03/2022   CHOLHDL 3.8 10/03/2022     Updated lab needed at/ before next visit.

## 2023-03-21 NOTE — Assessment & Plan Note (Signed)
Normal exam and symptoms have resolved , reassured , no bacterial or viral  infection

## 2023-03-21 NOTE — Progress Notes (Signed)
Yvonne Brewer     MRN: [id number]      DOB: [birth year]  Chief Complaint  Patient presents with   Follow-up    Follow up, sore throat    HPI Yvonne Brewer is here for follow up and re-evaluation of chronic medical conditions, in particular uncontrolled hypertension,medication management and review of any available recent lab and radiology data.  Preventive health is updated, specifically  Cancer screening and Immunization.   She had 2 visits in last several weeks for sore throat , which required no antibiotics and has essentially resolved, her spouse however continues to be ill and she wants to be checked. She has tested negative for covid ROS Denies recent fever or chills. Denies sinus pressure, nasal congestion, ear pain . Denies chest congestion, productive cough or wheezing. Denies chest pains, palpitations and leg swelling Denies abdominal pain, nausea, vomiting,diarrhea or constipation.   Denies dysuria, frequency, hesitancy or incontinence. Denies joint pain, swelling and limitation in mobility. Denies headaches, seizures, numbness, or tingling. Denies depression,mild  anxiety and stress in the past 1 week as she is looking at retirement dwelling and relocation with her spouse. Denies skin break down or rash.   PE  BP (!) 144/92   Pulse 90   Ht 5\' 2"  (1.575 m)   Wt 188 lb 1.3 oz (85.3 kg)   SpO2 94%   BMI 34.40 kg/m   Patient alert and oriented and in no cardiopulmonary distress.  HEENT: No facial asymmetry, EOMI,     Neck supple .Oropharynx no erythema or exudate, normal, no cervical or submandibular adenitis  Chest: Clear to auscultation bilaterally.  CVS: S1, S2 no murmurs, no S3.Regular rate.  ABD: Soft non tender.   Ext: No edema  MS: Adequate though reduced ROM spine, shoulders, hips and knees.  Skin: Intact, no ulcerations or rash noted.  Psych: Good eye contact, normal affect. Memory intact not anxious or depressed appearing.  CNS: CN 2-12  intact, power,  normal throughout.no focal deficits noted.   Assessment & Plan  Essential hypertension UnControlled, increase amlodipine dose to 7.5 mg daily, re eval in 8 weeks DASH diet and commitment to daily physical activity for a minimum of 30 minutes discussed and encouraged, as a part of hypertension management. The importance of attaining a healthy weight is also discussed.     03/20/2023   10:51 AM 03/20/2023   10:50 AM 03/20/2023    9:55 AM 03/10/2023    2:58 PM 03/07/2023    9:12 AM 11/28/2022   10:10 AM 11/26/2022    9:57 AM  BP/Weight  Systolic BP 144 144 102 134 122 126 128  Diastolic BP 92 94 65 70 72 81 60  Wt. (Lbs)   188.08 190  188.12 189  BMI   34.4 kg/m2 34.75 kg/m2  34.41 kg/m2 34.57 kg/m2       Hyperlipidemia LDL goal <100 Hyperlipidemia:Low fat diet discussed and encouraged.   Lipid Panel  Lab Results  Component Value Date   CHOL 202 (H) 10/03/2022   HDL 53 10/03/2022   LDLCALC 126 (H) 10/03/2022   TRIG 130 10/03/2022   CHOLHDL 3.8 10/03/2022     Updated lab needed at/ before next visit.   Morbid obesity (HCC)  Patient re-educated about  the importance of commitment to a  minimum of 150 minutes of exercise per week as able.  The importance of healthy food choices with portion control discussed, as well as eating regularly and within a 12 hour  window most days. The need to choose "clean , green" food 50 to 75% of the time is discussed, as well as to make water the primary drink and set a goal of 64 ounces water daily.       03/20/2023    9:55 AM 03/10/2023    2:58 PM 11/28/2022   10:10 AM  Weight /BMI  Weight 188 lb 1.3 oz 190 lb 188 lb 1.9 oz  Height 5\' 2"  (1.575 m) 5\' 2"  (1.575 m) 5\' 2"  (1.575 m)  BMI 34.4 kg/m2 34.75 kg/m2 34.41 kg/m2    unchnaged  IGT (impaired glucose tolerance) Patient educated about the importance of limiting  Carbohydrate intake , the need to commit to daily physical activity for a minimum of 30 minutes , and to  commit weight loss. The fact that changes in all these areas will reduce or eliminate all together the development of diabetes is stressed.      Latest Ref Rng & Units 10/03/2022    1:07 PM 06/04/2022    1:54 PM 04/17/2022    8:12 AM 10/19/2021    8:06 AM 01/15/2021    7:47 AM  Diabetic Labs  HbA1c 4.8 - 5.6 % 5.8   5.8  5.6    Chol 100 - 199 mg/dL 811   914  782  956   HDL >39 mg/dL 53   57  50  56   Calc LDL 0 - 99 mg/dL 213   62  086  578   Triglycerides 0 - 149 mg/dL 469   95  629  528   Creatinine 0.57 - 1.00 mg/dL 4.13  2.44  0.10  2.72  0.91       03/20/2023   10:51 AM 03/20/2023   10:50 AM 03/20/2023    9:55 AM 03/10/2023    2:58 PM 03/07/2023    9:12 AM 11/28/2022   10:10 AM 11/26/2022    9:57 AM  BP/Weight  Systolic BP 144 144 102 134 122 126 128  Diastolic BP 92 94 65 70 72 81 60  Wt. (Lbs)   188.08 190  188.12 189  BMI   34.4 kg/m2 34.75 kg/m2  34.41 kg/m2 34.57 kg/m2       No data to display          Updated lab needed at/ before next visit.   Sore throat Normal exam and symptoms have resolved , reassured , no bacterial or viral  infection

## 2023-03-21 NOTE — Assessment & Plan Note (Signed)
  Patient re-educated about  the importance of commitment to a  minimum of 150 minutes of exercise per week as able.  The importance of healthy food choices with portion control discussed, as well as eating regularly and within a 12 hour window most days. The need to choose "clean , green" food 50 to 75% of the time is discussed, as well as to make water the primary drink and set a goal of 64 ounces water daily.       03/20/2023    9:55 AM 03/10/2023    2:58 PM 11/28/2022   10:10 AM  Weight /BMI  Weight 188 lb 1.3 oz 190 lb 188 lb 1.9 oz  Height 5\' 2"  (1.575 m) 5\' 2"  (1.575 m) 5\' 2"  (1.575 m)  BMI 34.4 kg/m2 34.75 kg/m2 34.41 kg/m2    unchnaged

## 2023-03-21 NOTE — Assessment & Plan Note (Signed)
Patient educated about the importance of limiting  Carbohydrate intake , the need to commit to daily physical activity for a minimum of 30 minutes , and to commit weight loss. The fact that changes in all these areas will reduce or eliminate all together the development of diabetes is stressed.      Latest Ref Rng & Units 10/03/2022    1:07 PM 06/04/2022    1:54 PM 04/17/2022    8:12 AM 10/19/2021    8:06 AM 01/15/2021    7:47 AM  Diabetic Labs  HbA1c 4.8 - 5.6 % 5.8   5.8  5.6    Chol 100 - 199 mg/dL 782   956  213  086   HDL >39 mg/dL 53   57  50  56   Calc LDL 0 - 99 mg/dL 578   62  469  629   Triglycerides 0 - 149 mg/dL 528   95  413  244   Creatinine 0.57 - 1.00 mg/dL 0.10  2.72  5.36  6.44  0.91       03/20/2023   10:51 AM 03/20/2023   10:50 AM 03/20/2023    9:55 AM 03/10/2023    2:58 PM 03/07/2023    9:12 AM 11/28/2022   10:10 AM 11/26/2022    9:57 AM  BP/Weight  Systolic BP 144 144 102 134 122 126 128  Diastolic BP 92 94 65 70 72 81 60  Wt. (Lbs)   188.08 190  188.12 189  BMI   34.4 kg/m2 34.75 kg/m2  34.41 kg/m2 34.57 kg/m2       No data to display          Updated lab needed at/ before next visit.

## 2023-03-21 NOTE — Assessment & Plan Note (Signed)
UnControlled, increase amlodipine dose to 7.5 mg daily, re eval in 8 weeks DASH diet and commitment to daily physical activity for a minimum of 30 minutes discussed and encouraged, as a part of hypertension management. The importance of attaining a healthy weight is also discussed.     03/20/2023   10:51 AM 03/20/2023   10:50 AM 03/20/2023    9:55 AM 03/10/2023    2:58 PM 03/07/2023    9:12 AM 11/28/2022   10:10 AM 11/26/2022    9:57 AM  BP/Weight  Systolic BP 144 144 102 134 122 126 128  Diastolic BP 92 94 65 70 72 81 60  Wt. (Lbs)   188.08 190  188.12 189  BMI   34.4 kg/m2 34.75 kg/m2  34.41 kg/m2 34.57 kg/m2

## 2023-03-28 ENCOUNTER — Encounter: Payer: Self-pay | Admitting: Emergency Medicine

## 2023-03-28 ENCOUNTER — Other Ambulatory Visit: Payer: Self-pay

## 2023-03-28 ENCOUNTER — Ambulatory Visit
Admission: EM | Admit: 2023-03-28 | Discharge: 2023-03-28 | Disposition: A | Discharge status: Home / Self Care | Payer: PPO | Attending: Nurse Practitioner | Admitting: Nurse Practitioner

## 2023-03-28 DIAGNOSIS — Z87440 Personal history of urinary (tract) infections: Secondary | ICD-10-CM | POA: Insufficient documentation

## 2023-03-28 DIAGNOSIS — N3000 Acute cystitis without hematuria: Secondary | ICD-10-CM | POA: Diagnosis not present

## 2023-03-28 LAB — POCT URINALYSIS DIP (MANUAL ENTRY)
Bilirubin, UA: NEGATIVE
Glucose, UA: NEGATIVE mg/dL
Ketones, POC UA: NEGATIVE mg/dL
Nitrite, UA: POSITIVE — AB
Protein Ur, POC: 100 mg/dL — AB
Spec Grav, UA: 1.025 (ref 1.010–1.025)
Urobilinogen, UA: 0.2 E.U./dL
pH, UA: 6 (ref 5.0–8.0)

## 2023-03-28 MED ORDER — NITROFURANTOIN MONOHYD MACRO 100 MG PO CAPS: 100.0000 mg | ORAL_CAPSULE | Freq: Two times a day (BID) | ORAL | 0 refills | Status: AC

## 2023-03-28 NOTE — ED Triage Notes (Signed)
Pt reports urinary frequency at night last Wednesday and reports intermittent dysuria ever since. Denies any fever, abd pain.

## 2023-03-28 NOTE — ED Provider Notes (Signed)
[facility]    CSN: [id number] Arrival date & time: 03/28/23  0840      History   Chief Complaint Chief Complaint  Patient presents with   Dysuria    HPI Yvonne Brewer is a 77 y.o. female.   The history is provided by the patient.   Patient presents with a 3-day history of pain with urination, urinary frequency/urgency, and hesitancy.  Patient denies fever, chills, chest pain, abdominal pain, flank pain, low back pain, hematuria, or decreased urine stream.  Patient reports that she "has had enough of these to know this is a UTI".  Last UTI was treated in April of this year.  Patient was seen by her PCP at that time, and urology referral was made for recurrent UTI.Marland Kitchen  Past Medical History:  Diagnosis Date   Essential hypertension 2015   Female bladder prolapse    History of cystitis    History of skin cancer    Hyperlipidemia    Obesity     Patient Active Problem List   Diagnosis Date Noted   Sore throat 03/21/2023   Viral URI with cough 07/10/2022   IGT (impaired glucose tolerance) 04/24/2022   Abnormal EKG 03/12/2022   Varicose veins of leg with swelling 03/18/2021   Left shoulder pain 07/10/2020   Heartburn 07/07/2019   Fracture, foot, left, sequela 02/09/2018   Dyspepsia 04/18/2017   Osteoarthritis of both hips 04/18/2017   History of colonic polyps 11/13/2016   Pseudophakia 11/12/2016   Urinary incontinence in female 07/29/2016   Environmental allergies 07/14/2016   Annual visit for general adult medical examination with abnormal findings 02/21/2016   Chronic radicular pain of lower back 03/13/2014   Reduced vision 03/13/2014   Essential hypertension 03/08/2014   Morbid obesity (HCC) 02/19/2010   Vitamin D deficiency 07/27/2009   Hyperlipidemia LDL goal <100 02/19/2008   Disorder of bladder 02/19/2008    Past Surgical History:  Procedure Laterality Date   CATARACT EXTRACTION  2012   bilateral    COLONOSCOPY  11/06/2011   Procedure:  COLONOSCOPY;  Surgeon: Malissa Hippo, MD;  Location: [facility];  Service: Endoscopy;  Laterality: N/A;  7:30   COLONOSCOPY N/A 05/21/2017   Procedure: COLONOSCOPY;  Surgeon: Malissa Hippo, MD;  Location: [facility];  Service: Endoscopy;  Laterality: N/A;  730-moved to 9/26 @10 :30am per Dewayne Hatch   EYE SURGERY  2011   bilateral cataract with implants   HAMMER TOE SURGERY Left 01/2016   Left bunionectomy     Squamous cell ca rt ant chest 1989     YAG LASER APPLICATION Left 10/24/2015   Procedure: YAG LASER APPLICATION;  Surgeon: Jethro Bolus, MD;  Location: [facility];  Service: Ophthalmology;  Laterality: Left;   YAG LASER APPLICATION Right 11/07/2015   Procedure: YAG LASER APPLICATION;  Surgeon: Jethro Bolus, MD;  Location: [facility];  Service: Ophthalmology;  Laterality: Right;    OB History   No obstetric history on file.      Home Medications    Prior to Admission medications   Medication Sig Start Date End Date Taking? Authorizing Provider  nitrofurantoin, macrocrystal-monohydrate, (MACROBID) 100 MG capsule Take 1 capsule (100 mg total) by mouth 2 (two) times daily for 7 days. 03/28/23 04/04/23 Yes Anala Whisenant-Warren, Sadie Haber, NP  amLODipine (NORVASC) 2.5 MG tablet Take 1 tablet (2.5 mg total) by mouth daily. 03/20/23   Kerri Perches, MD  amLODipine (NORVASC) 5 MG tablet Take 1 tablet (5 mg total)  by mouth daily. 11/28/22   Kerri Perches, MD  fluticasone (FLONASE) 50 MCG/ACT nasal spray Place 1 spray into both nostrils 2 (two) times daily. 03/07/23   Particia Nearing, PA-C  nitrofurantoin (MACRODANTIN) 50 MG capsule Take one capsule by mouth once daily 12/16/22   Kerri Perches, MD  omeprazole (PRILOSEC) 20 MG capsule Take 20 mg by mouth daily. Patient not taking: Reported on 03/28/2023    [provider]  promethazine-dextromethorphan (PROMETHAZINE-DM) 6.25-15 MG/5ML syrup Take 5 mLs by mouth 4 (four) times daily as needed. 03/07/23   Particia Nearing, PA-C     Family History Family History  Problem Relation Age of Onset   Stroke Mother    Hypertension Mother    Heart disease Father    Cancer Maternal Grandmother    Anesthesia problems Neg Hx    Hypotension Neg Hx    Malignant hyperthermia Neg Hx    Pseudochol deficiency Neg Hx     Social History Social History   Tobacco Use   Smoking status: Never   Smokeless tobacco: Never  Vaping Use   Vaping status: Never Used  Substance Use Topics   Alcohol use: Yes    Comment: glass of red wine most nights or scotch and water   Drug use: No     Allergies   Ace inhibitors, Amlodipine, Penicillins, Statins, and Sulfonamide derivatives   Review of Systems Review of Systems Per HPI  Physical Exam Triage Vital Signs ED Triage Vitals [03/28/23 0851]  Encounter Vitals Group     BP 120/80     Systolic BP Percentile      Diastolic BP Percentile      Pulse Rate 82     Resp 18     Temp 98.1 F (36.7 C)     Temp Source Oral     SpO2 95 %     Weight      Height      Head Circumference      Peak Flow      Pain Score 3     Pain Loc      Pain Education      Exclude from Growth Chart    No data found.  Updated Vital Signs BP 120/80 (BP Location: Right Arm)   Pulse 82   Temp 98.1 F (36.7 C) (Oral)   Resp 18   SpO2 95%   Visual Acuity Right Eye Distance:   Left Eye Distance:   Bilateral Distance:    Right Eye Near:   Left Eye Near:    Bilateral Near:     Physical Exam Vitals and nursing note reviewed.  Constitutional:      General: She is not in acute distress.    Appearance: Normal appearance.  HENT:     Head: Normocephalic.  Eyes:     Extraocular Movements: Extraocular movements intact.     Pupils: Pupils are equal, round, and reactive to light.  Cardiovascular:     Rate and Rhythm: Normal rate and regular rhythm.     Pulses: Normal pulses.     Heart sounds: Normal heart sounds.  Pulmonary:     Effort: Pulmonary effort is normal. No respiratory  distress.     Breath sounds: Normal breath sounds. No stridor. No wheezing, rhonchi or rales.  Abdominal:     General: Bowel sounds are normal.     Palpations: Abdomen is soft.     Tenderness: There is no abdominal tenderness. There is no  right CVA tenderness or left CVA tenderness.  Musculoskeletal:     Cervical back: Normal range of motion.  Lymphadenopathy:     Cervical: No cervical adenopathy.  Skin:    General: Skin is warm and dry.  Neurological:     General: No focal deficit present.     Mental Status: She is alert and oriented to person, place, and time.  Psychiatric:        Mood and Affect: Mood normal.        Behavior: Behavior normal.      UC Treatments / Results  Labs (all labs ordered are listed, but only abnormal results are displayed) Labs Reviewed  POCT URINALYSIS DIP (MANUAL ENTRY) - Abnormal; Notable for the following components:      Result Value   Clarity, UA cloudy (*)    Blood, UA moderate (*)    Protein Ur, POC =100 (*)    Nitrite, UA Positive (*)    Leukocytes, UA Large (3+) (*)    All other components within normal limits  URINE CULTURE    EKG   Radiology No results found.  Procedures Procedures (including critical care time)  Medications Ordered in UC Medications - No data to display  Initial Impression / Assessment and Plan / UC Course  I have reviewed the triage vital signs and the nursing notes.  Pertinent labs & imaging results that were available during my care of the patient were reviewed by me and considered in my medical decision making (see chart for details).  The patient is well-appearing, she is in no acute distress, vital signs are stable.  Urinalysis is positive for a urinary tract infection in the presence of large leukocytes, nitrates, protein, and blood.  Review of patient's culture results from April regarding prior bacteria.  Patient was treated with Macrobid at that time.  Will treat patient with Macrobid as well  given her history of allergies to penicillin and sulfa.  Urine culture is pending.  Also discussed with patient that given her history, recommend following up with urology if symptoms fail to improve with this treatment.  Patient also advised to follow-up with urology given her underlying history of recurrent UTIs.  Patient was given supportive care recommendations to include increasing her water intake, voiding every 2 hours, and avoiding caffeine while symptoms persist.  Patient was given strict ER follow-up precautions.  Patient is in agreement with this plan of care and verbalizes understanding.  All questions were answered.  Patient stable for discharge.   Final Clinical Impressions(s) / UC Diagnoses   Final diagnoses:  Acute cystitis without hematuria  History of recurrent UTIs     Discharge Instructions      -The urinalysis shows that you have a urinary tract infection.  A urine culture has been ordered to ensure you are being treated with the appropriate antibiotic.  If the medication needs to be changed, you will be contacted. -Take medications as prescribed. -Increase fluids.  Try to drink at least 8-10 8 ounce glasses of water while symptoms persist. -Ibuprofen or Tylenol for pain, fever, or general discomfort. -Develop a toileting schedule that will allow you to toilet at least every 2 hours. -Avoid caffeine to include tea, soda, and coffee while symptoms persist. -If symptoms do not improve with this treatment, recommend following up with urology for further evaluation.  Also given your history of recurrent urinary tract infections, would recommend follow-up with urology to discuss further evaluation. -Follow-up in the emergency department if  symptoms fail to improve and you develop new symptoms to include fever, chills, abdominal pain, or other concerns.        ED Prescriptions     Medication Sig Dispense Auth. Provider   nitrofurantoin, macrocrystal-monohydrate,  (MACROBID) 100 MG capsule Take 1 capsule (100 mg total) by mouth 2 (two) times daily for 7 days. 14 capsule Kaleigha Chamberlin-Warren, Sadie Haber, NP      PDMP not reviewed this encounter.   Abran Cantor, NP 03/28/23 [id number]

## 2023-03-28 NOTE — Discharge Instructions (Addendum)
-  The urinalysis shows that you have a urinary tract infection.  A urine culture has been ordered to ensure you are being treated with the appropriate antibiotic.  If the medication needs to be changed, you will be contacted. -Take medications as prescribed. -Increase fluids.  Try to drink at least 8-10 8 ounce glasses of water while symptoms persist. -Ibuprofen or Tylenol for pain, fever, or general discomfort. -Develop a toileting schedule that will allow you to toilet at least every 2 hours. -Avoid caffeine to include tea, soda, and coffee while symptoms persist. -If symptoms do not improve with this treatment, recommend following up with urology for further evaluation.  Also given your history of recurrent urinary tract infections, would recommend follow-up with urology to discuss further evaluation. -Follow-up in the emergency department if symptoms fail to improve and you develop new symptoms to include fever, chills, abdominal pain, or other concerns.

## 2023-03-30 ENCOUNTER — Telehealth: Payer: Self-pay

## 2023-03-30 MED ORDER — CIPROFLOXACIN HCL 500 MG PO TABS: 500.0000 mg | ORAL_TABLET | Freq: Two times a day (BID) | ORAL | 0 refills | Status: DC

## 2023-03-30 NOTE — Telephone Encounter (Signed)
Per Malena Edman, NP: tx with Cirpo 500mg  BID x5 days. Attempted to reach patient x1. LVM Rx sent to pharmacy on file.

## 2023-05-02 ENCOUNTER — Other Ambulatory Visit: Payer: Self-pay | Admitting: Family Medicine

## 2023-05-16 IMAGING — US US BREAST*L* LIMITED INC AXILLA
1 series · 7 of 7 positions shown · non-contrast
Comparison: Previous exams.

CLINICAL DATA: Screening recall for a left breast asymmetry.

EXAM:
DIGITAL DIAGNOSTIC UNILATERAL LEFT MAMMOGRAM WITH TOMOSYNTHESIS AND
CAD; ULTRASOUND LEFT BREAST LIMITED
TECHNIQUE: Left digital diagnostic mammography and breast tomosynthesis was
performed. The images were evaluated with computer-aided detection.;
Targeted ultrasound examination of the left breast was performed.

[Series 1: us breast*left* limited inc axilla · 0.06mm/px · 7 of 7 slices shown]
[im 1/7]
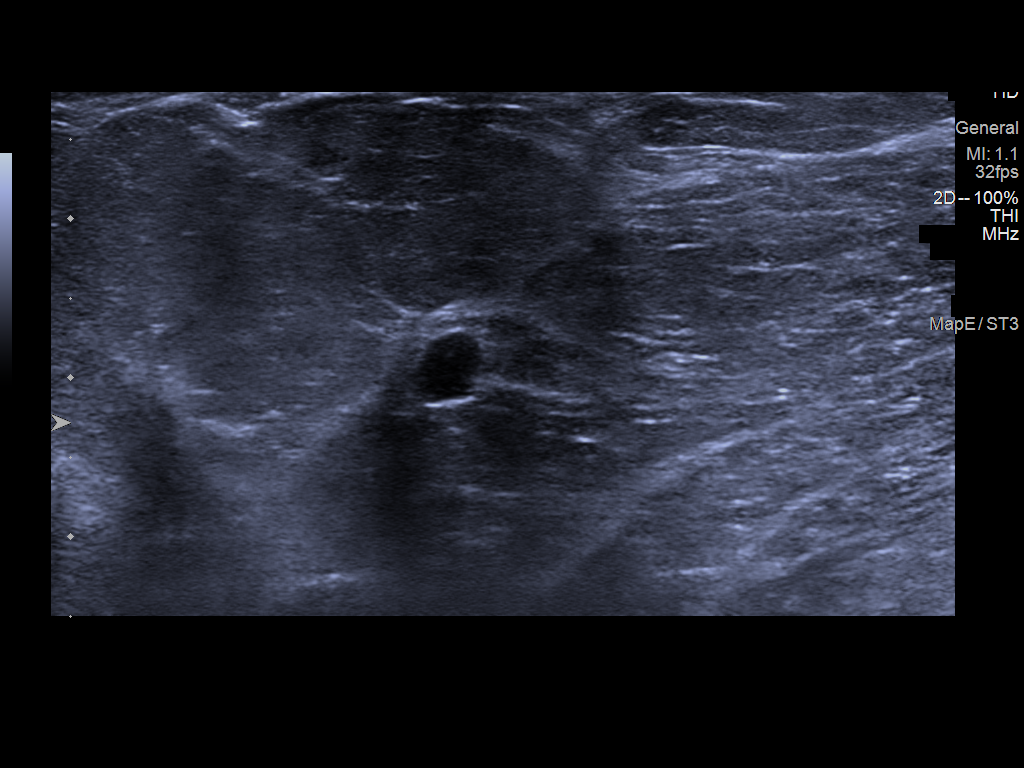
[im 2/7]
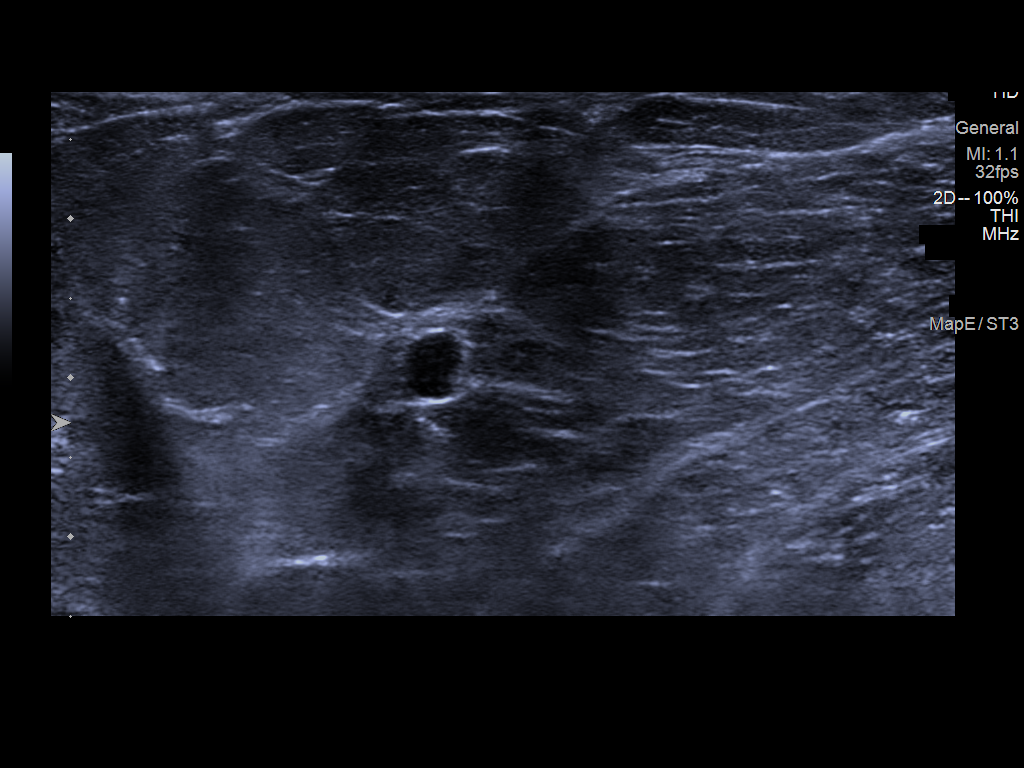
[im 3/7]
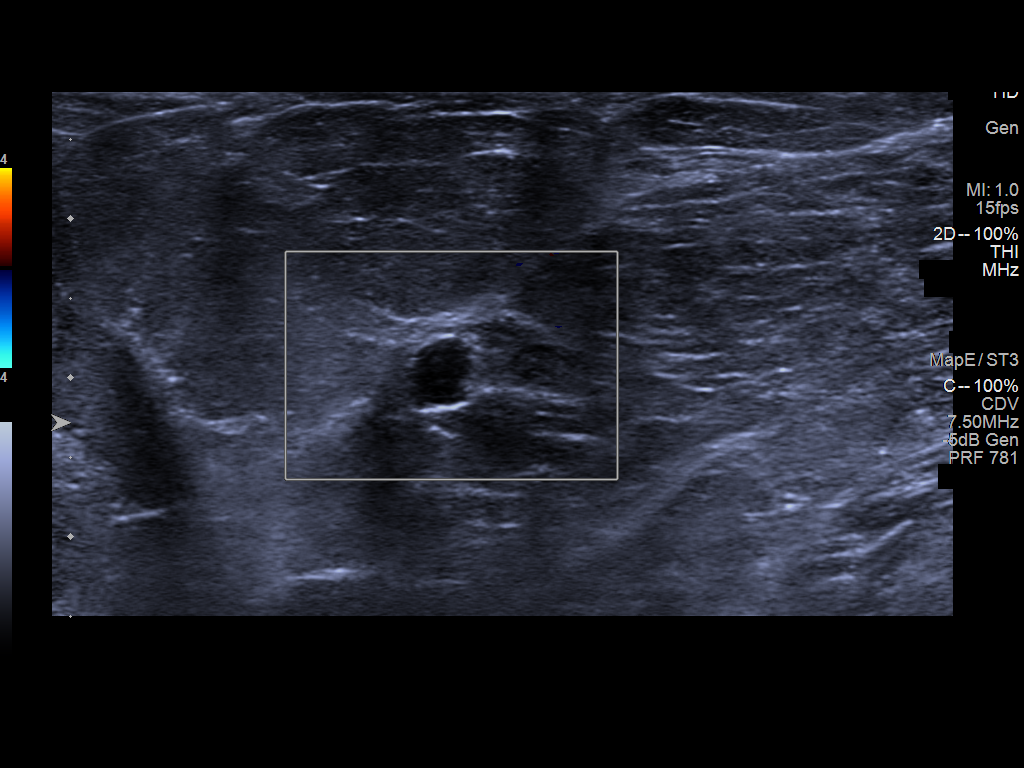
[im 4/7]
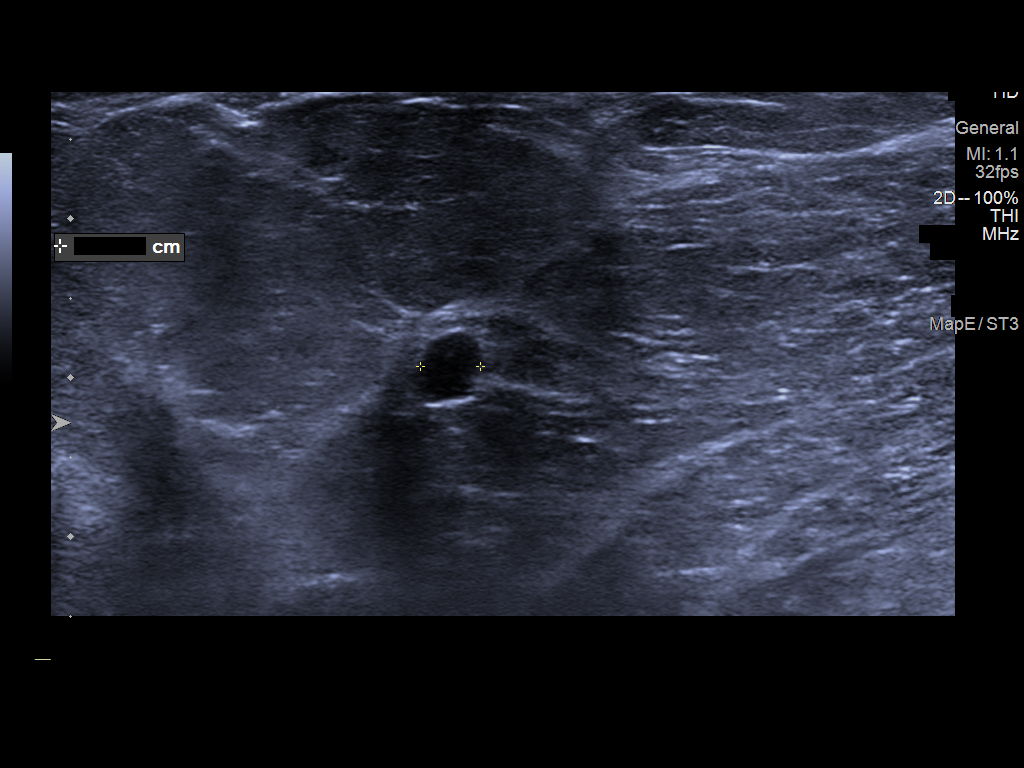
[im 5/7]
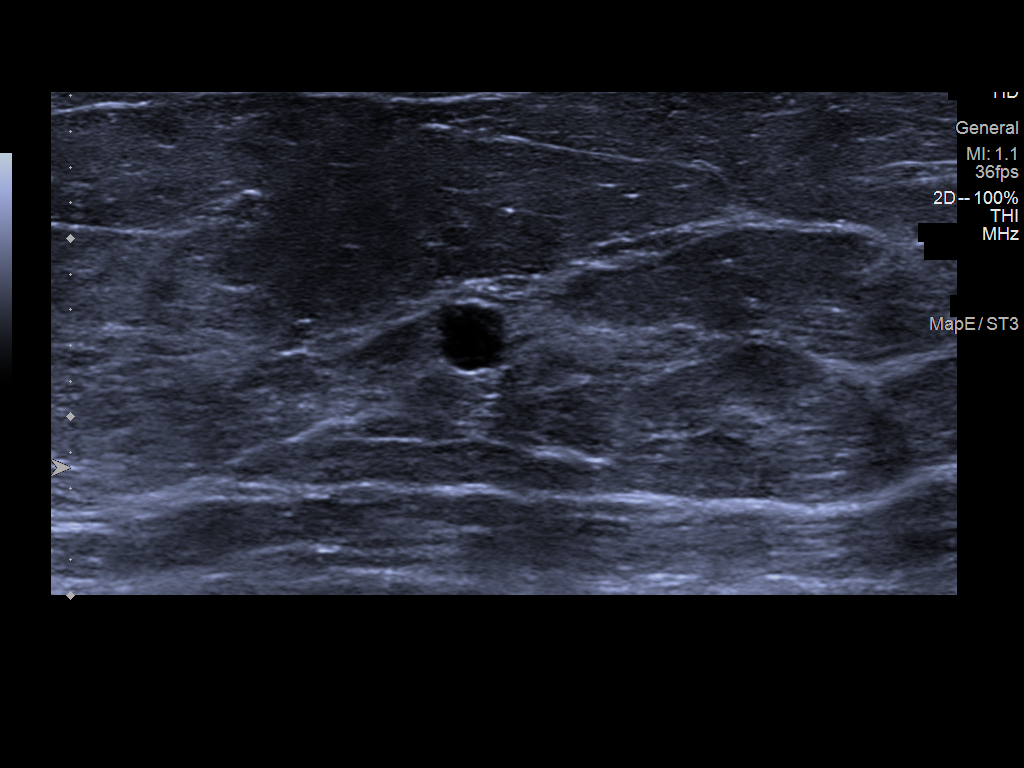
[im 6/7]
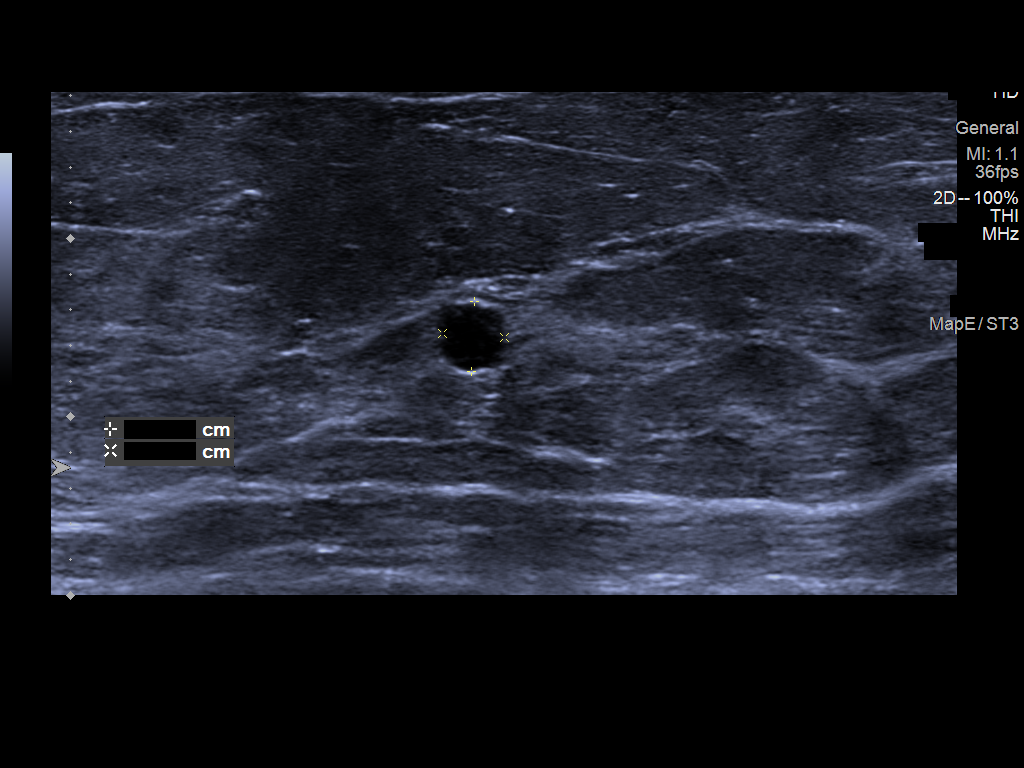
[im 7/7]
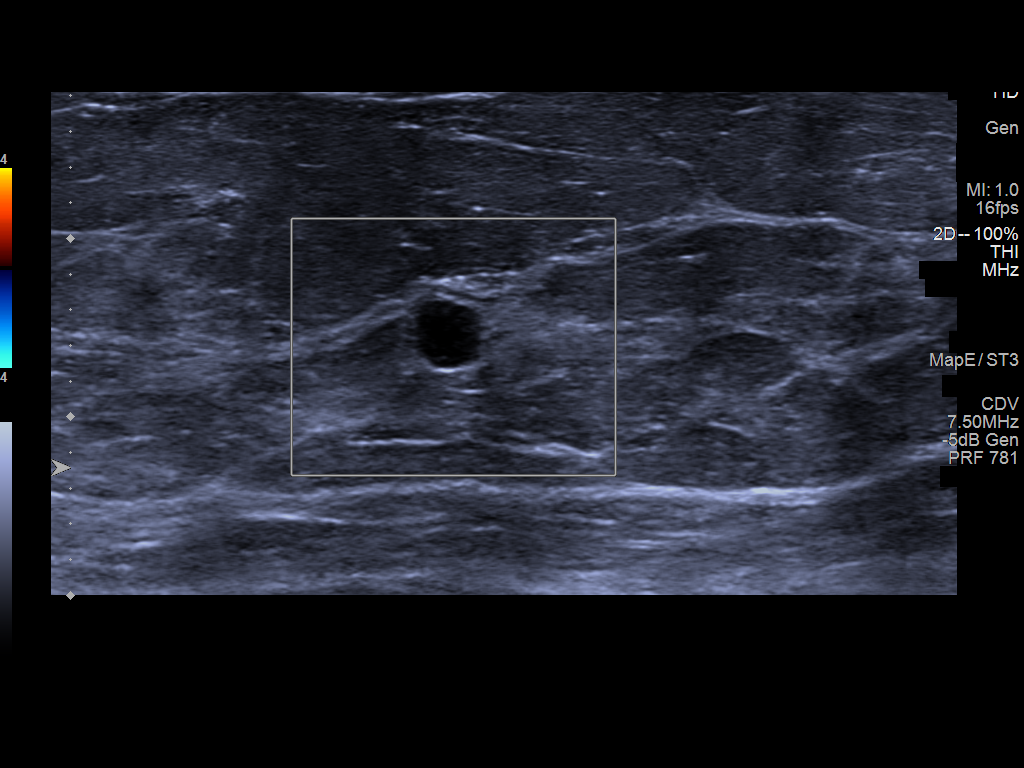

[7 of 7 positions shown; findings below may reference images not displayed]

ACR Breast Density Category b: There are scattered areas of
fibroglandular density.
FINDINGS: Additional tomograms were performed of the left breast. There is an
oval circumscribed mass in the upper-outer left breast measuring
cm.

Targeted ultrasound of upper-outer left breast was. There is a cyst
in the left breast at 2 o'clock 4 cm from nipple measuring 0.4 x
x 0.4 cm. This corresponds well with the mass seen in the
upper-outer left breast at mammography.
IMPRESSION: No findings of malignancy in the left breast.

RECOMMENDATION:
Screening mammogram in one year.(Code:IK-[id number])

I have discussed the findings and recommendations with the patient.
If applicable, a reminder letter will be sent to the patient
regarding the next appointment.

BI-RADS CATEGORY  2: Benign.

## 2023-05-16 IMAGING — MG MM DIGITAL DIAGNOSTIC UNILAT*L* W/ TOMO W/ CAD
4 series · 4 of 12 positions shown · non-contrast
Comparison: Previous exams.

CLINICAL DATA: Screening recall for a left breast asymmetry.

EXAM:
DIGITAL DIAGNOSTIC UNILATERAL LEFT MAMMOGRAM WITH TOMOSYNTHESIS AND
CAD; ULTRASOUND LEFT BREAST LIMITED
TECHNIQUE: Left digital diagnostic mammography and breast tomosynthesis was
performed. The images were evaluated with computer-aided detection.;
Targeted ultrasound examination of the left breast was performed.

[L CC synth-2D]
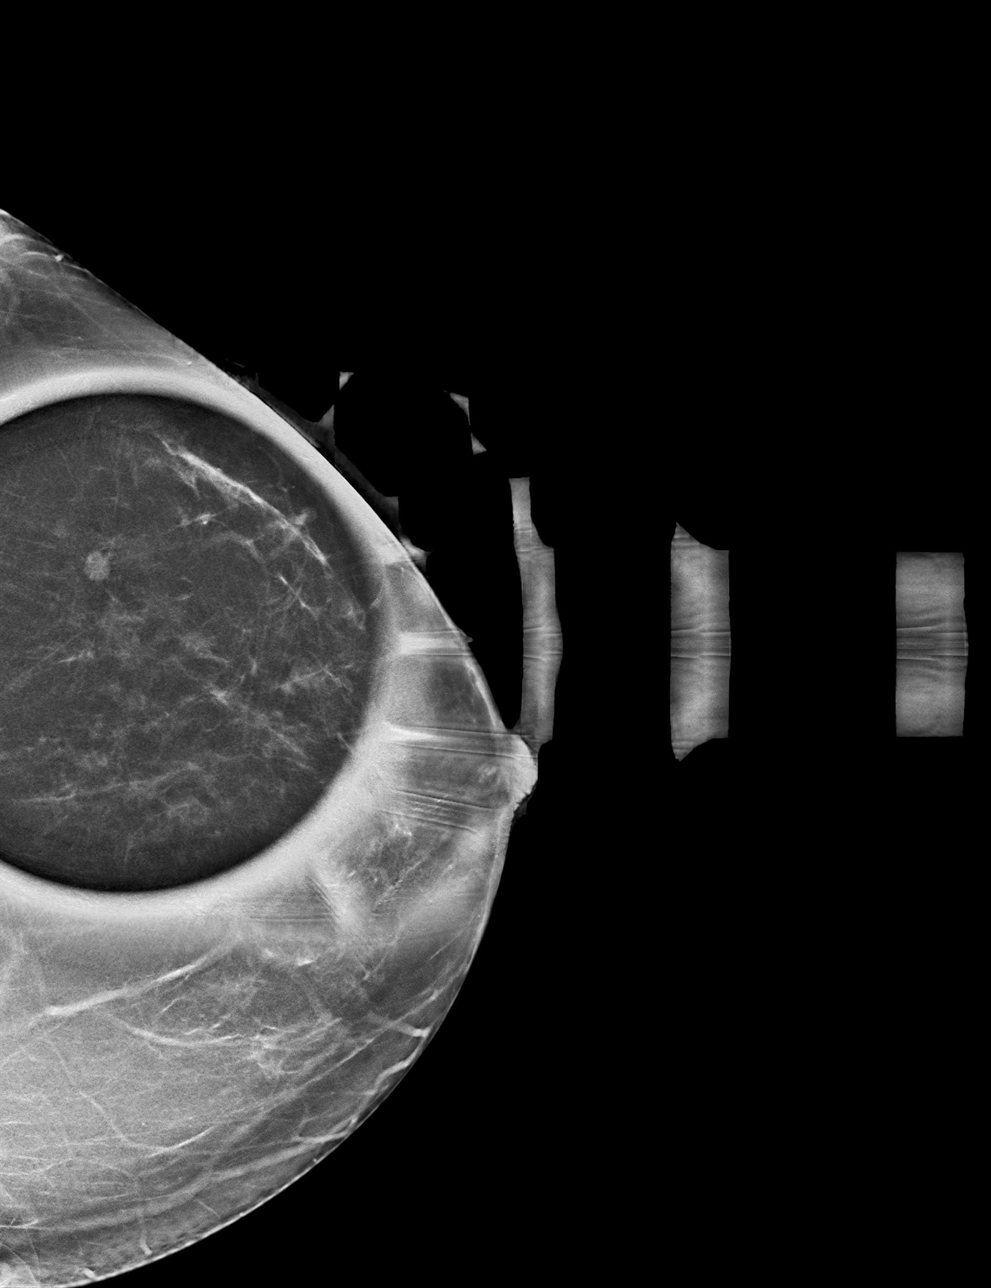

[L MLO synth-2D]
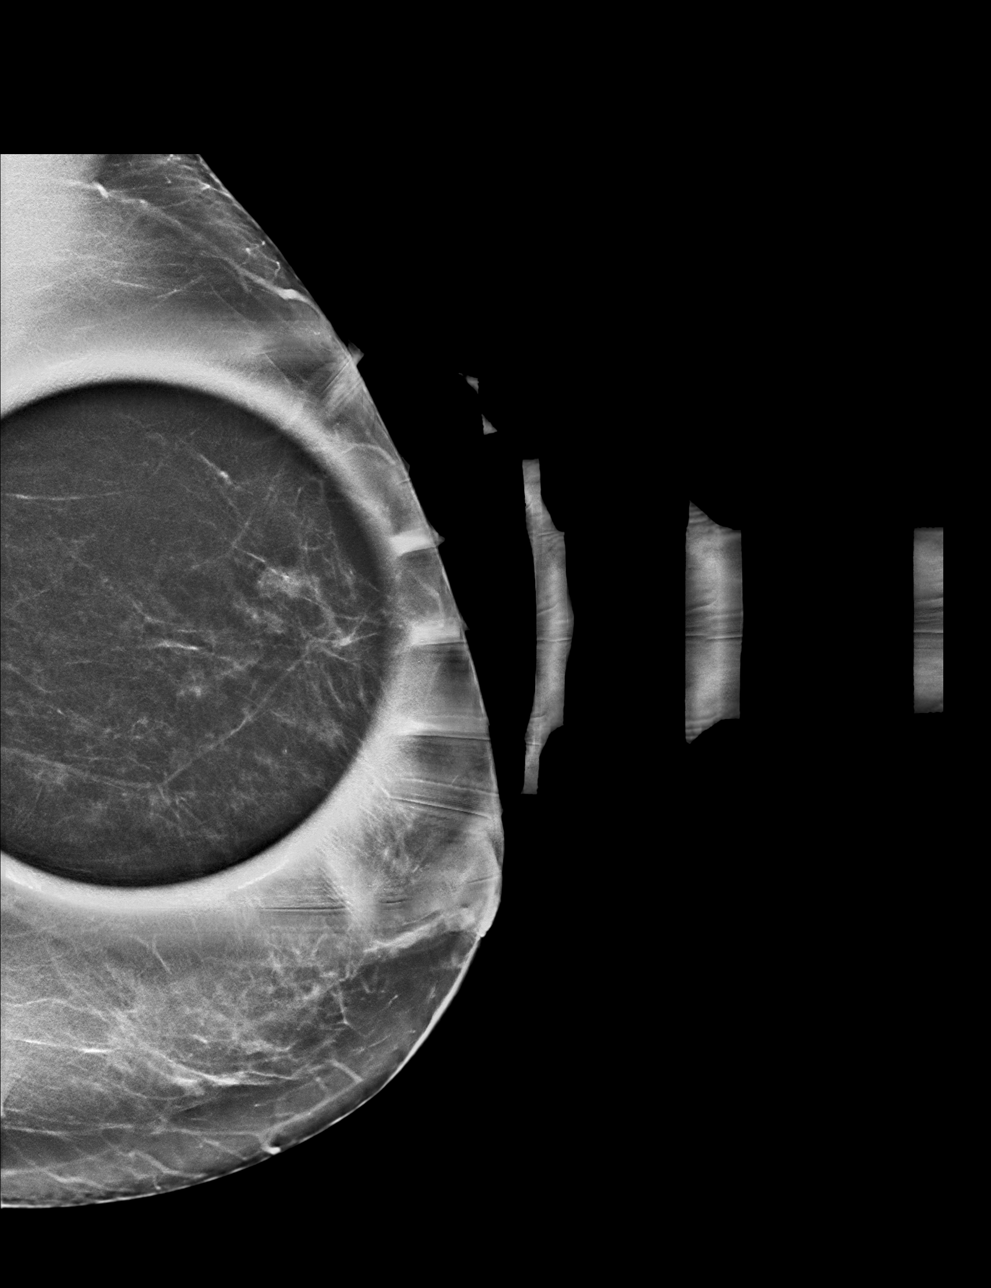

[L MLO tomo · tomo slice 29/58.0]
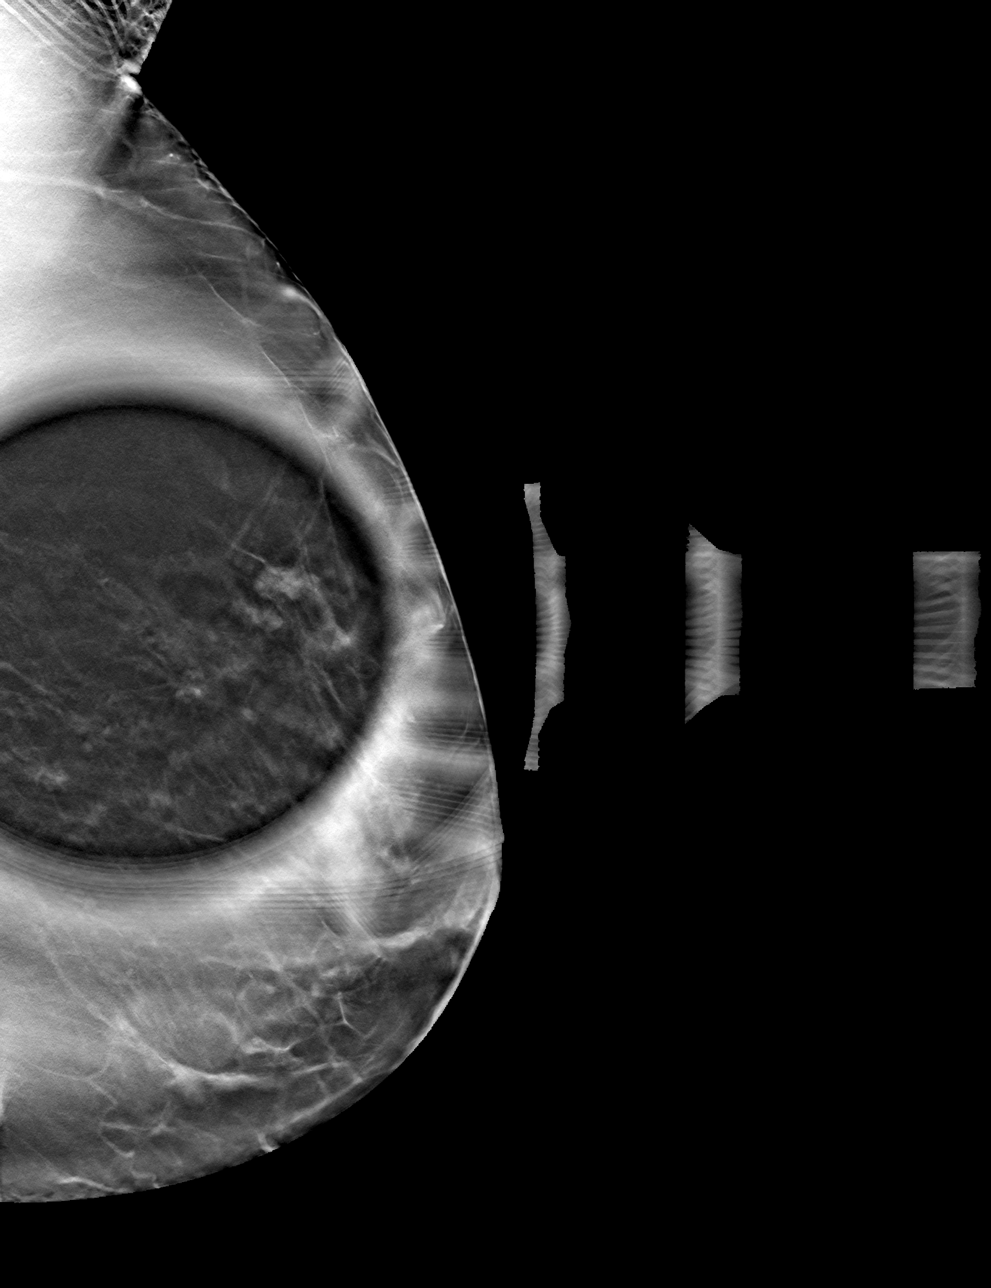

[L CC tomo · tomo slice 26/51.0]
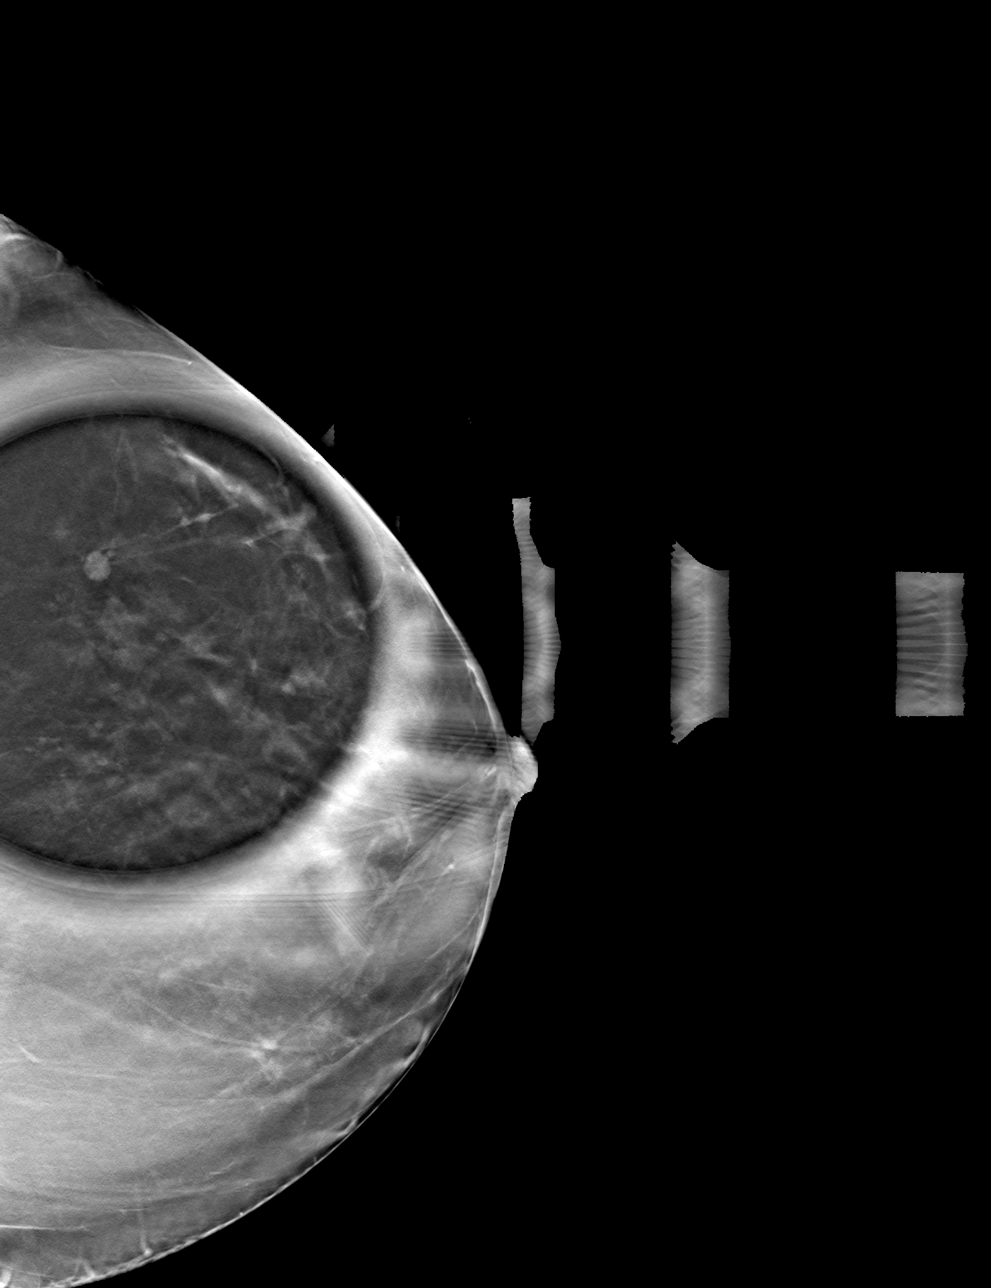

[4 of 12 positions shown; findings below may reference images not displayed]

ACR Breast Density Category b: There are scattered areas of
fibroglandular density.
FINDINGS: Additional tomograms were performed of the left breast. There is an
oval circumscribed mass in the upper-outer left breast measuring
cm.

Targeted ultrasound of upper-outer left breast was. There is a cyst
in the left breast at 2 o'clock 4 cm from nipple measuring 0.4 x
x 0.4 cm. This corresponds well with the mass seen in the
upper-outer left breast at mammography.
IMPRESSION: No findings of malignancy in the left breast.

RECOMMENDATION:
Screening mammogram in one year.(Code:IK-6-FC8)

I have discussed the findings and recommendations with the patient.
If applicable, a reminder letter will be sent to the patient
regarding the next appointment.

BI-RADS CATEGORY  2: Benign.

## 2023-05-26 ENCOUNTER — Ambulatory Visit (INDEPENDENT_AMBULATORY_CARE_PROVIDER_SITE_OTHER): Payer: PPO

## 2023-05-26 DIAGNOSIS — Z23 Encounter for immunization: Secondary | ICD-10-CM | POA: Diagnosis not present

## 2023-05-27 DIAGNOSIS — Z08 Encounter for follow-up examination after completed treatment for malignant neoplasm: Secondary | ICD-10-CM | POA: Diagnosis not present

## 2023-05-27 DIAGNOSIS — Z85828 Personal history of other malignant neoplasm of skin: Secondary | ICD-10-CM | POA: Diagnosis not present

## 2023-05-27 DIAGNOSIS — I8310 Varicose veins of unspecified lower extremity with inflammation: Secondary | ICD-10-CM | POA: Diagnosis not present

## 2023-05-27 DIAGNOSIS — L821 Other seborrheic keratosis: Secondary | ICD-10-CM | POA: Diagnosis not present

## 2023-05-28 ENCOUNTER — Ambulatory Visit: Payer: PPO | Admitting: Family Medicine

## 2023-06-16 ENCOUNTER — Other Ambulatory Visit: Payer: Self-pay

## 2023-06-16 MED ORDER — AMLODIPINE BESYLATE 5 MG PO TABS: 5.0000 mg | ORAL_TABLET | Freq: Every day | ORAL | 3 refills | Status: DC

## 2023-06-16 MED ORDER — AMLODIPINE BESYLATE 2.5 MG PO TABS: 2.5000 mg | ORAL_TABLET | Freq: Every day | ORAL | 3 refills | Status: DC

## 2023-06-25 ENCOUNTER — Encounter: Payer: Self-pay | Admitting: Family Medicine

## 2023-06-25 ENCOUNTER — Ambulatory Visit (INDEPENDENT_AMBULATORY_CARE_PROVIDER_SITE_OTHER): Payer: PPO | Admitting: Family Medicine

## 2023-06-25 VITALS — BP 128/82 | HR 86 | Ht 62.0 in | Wt 188.1 lb

## 2023-06-25 DIAGNOSIS — I10 Essential (primary) hypertension: Secondary | ICD-10-CM

## 2023-06-25 DIAGNOSIS — E785 Hyperlipidemia, unspecified: Secondary | ICD-10-CM | POA: Diagnosis not present

## 2023-06-25 DIAGNOSIS — D126 Benign neoplasm of colon, unspecified: Secondary | ICD-10-CM | POA: Diagnosis not present

## 2023-06-25 DIAGNOSIS — Z1231 Encounter for screening mammogram for malignant neoplasm of breast: Secondary | ICD-10-CM

## 2023-06-25 DIAGNOSIS — R7302 Impaired glucose tolerance (oral): Secondary | ICD-10-CM | POA: Diagnosis not present

## 2023-06-25 DIAGNOSIS — E66811 Obesity, class 1: Secondary | ICD-10-CM | POA: Diagnosis not present

## 2023-06-25 DIAGNOSIS — R12 Heartburn: Secondary | ICD-10-CM | POA: Diagnosis not present

## 2023-06-25 NOTE — Patient Instructions (Addendum)
Annual exam end March/ early April, call if you need me sooner  Blood pressure is excellent, no med changes  Labs ordered in April to be done today  Need TDaP ( nurse pls verify she has not had this, also please document / check, states she has had the covid vaccine)  Please schedule mammogram at checkout  You will be referred to dr Jena Gauss e re colon screening moving  forward, based on your personal history and level of function   It is important that you exercise regularly at least 30 minutes 5 times a week. If you develop chest pain, have severe difficulty breathing, or feel very tired, stop exercising immediately and seek medical attention   Think about what you will eat, plan ahead. Choose " clean, green, fresh or frozen" over canned, processed or packaged foods which are more sugary, salty and fatty. 70 to 75% of food eaten should be vegetables and fruit. Three meals at set times with snacks allowed between meals, but they must be fruit or vegetables. Aim to eat over a 12 hour period , example 7 am to 7 pm, and STOP after  your last meal of the day. Drink water,generally about 64 ounces per day, no other drink is as healthy. Fruit juice is best enjoyed in a healthy way, by EATING the fruit.   Thanks for choosing [facility], we consider it a privelige to serve you.

## 2023-06-26 LAB — CMP14+EGFR
ALT: 11 [IU]/L (ref 0–32)
AST: 15 [IU]/L (ref 0–40)
Albumin: 4.2 g/dL (ref 3.8–4.8)
Alkaline Phosphatase: 70 [IU]/L (ref 44–121)
BUN/Creatinine Ratio: 14 (ref 12–28)
BUN: 14 mg/dL (ref 8–27)
Bilirubin Total: 0.4 mg/dL (ref 0.0–1.2)
CO2: 23 mmol/L (ref 20–29)
Calcium: 9.5 mg/dL (ref 8.7–10.3)
Chloride: 104 mmol/L (ref 96–106)
Creatinine, Ser: 1 mg/dL (ref 0.57–1.00)
Globulin, Total: 2.4 g/dL (ref 1.5–4.5)
Glucose: 100 mg/dL — ABNORMAL HIGH (ref 70–99)
Potassium: 4.6 mmol/L (ref 3.5–5.2)
Sodium: 144 mmol/L (ref 134–144)
Total Protein: 6.6 g/dL (ref 6.0–8.5)
eGFR: 58 mL/min/{1.73_m2} — ABNORMAL LOW (ref >59)

## 2023-06-26 LAB — HEMOGLOBIN A1C
Est. average glucose Bld gHb Est-mCnc: 117 mg/dL
Hgb A1c MFr Bld: 5.7 % — ABNORMAL HIGH (ref 4.8–5.6)

## 2023-06-26 LAB — LIPID PANEL
Chol/HDL Ratio: 4.5 {ratio} — ABNORMAL HIGH (ref 0.0–4.4)
Cholesterol, Total: 222 mg/dL — ABNORMAL HIGH (ref 100–199)
HDL: 49 mg/dL (ref >39)
LDL Chol Calc (NIH): 141 mg/dL — ABNORMAL HIGH (ref 0–99)
Triglycerides: 181 mg/dL — ABNORMAL HIGH (ref 0–149)
VLDL Cholesterol Cal: 32 mg/dL (ref 5–40)

## 2023-06-27 ENCOUNTER — Other Ambulatory Visit: Payer: Self-pay

## 2023-06-27 DIAGNOSIS — E785 Hyperlipidemia, unspecified: Secondary | ICD-10-CM

## 2023-06-30 NOTE — Progress Notes (Incomplete)
   Yvonne Brewer     MRN: [id number]      DOB: [birth year]  Chief Complaint  Patient presents with  . Follow-up    Arthritis pain, weight loss    HPI [name] is here for follow up and re-evaluation of chronic medical conditions, medication management and review of any available recent lab and radiology data.  Preventive health is updated, specifically  Cancer screening and Immunization.   Questions or concerns regarding consultations or procedures which the PT has had in the interim are  addressed. The PT denies any adverse reactions to current medications since the last visit.  Concerned about inability to lose weight , wants to change this by being more disciplined with eating in particular C/o arthritis pain increased because of weight   ROS: Denies recent fever or chills. Denies sinus pressure, nasal congestion, ear pain or sore throat. Denies chest congestion, productive cough or wheezing. Denies chest pains, palpitations and leg swelling Denies abdominal pain, nausea, vomiting,diarrhea or constipation.   Denies dysuria, frequency, hesitancy or incontinence. Denies headaches, seizures, numbness, or tingling. Denies depression, anxiety or insomnia. Denies skin break down or rash.   PE  BP 128/82   Pulse 86   Ht 5\' 2"  (1.575 m)   Wt 188 lb 1.3 oz (85.3 kg)   SpO2 96%   BMI 34.40 kg/m   Patient alert and oriented and in no cardiopulmonary distress.  HEENT: No facial asymmetry, EOMI,     Neck supple .  Chest: Clear to auscultation bilaterally.  CVS: S1, S2 no murmurs, no S3.Regular rate.  ABD: Soft non tender.   Ext: No edema  MS: Adequate  though reduced ROM spine,  hips and knees.  Skin: Intact, no ulcerations or rash noted.  Psych: Good eye contact, normal affect. Memory intact not anxious or depressed appearing.  CNS: CN 2-12 intact, power,  normal throughout.no focal deficits noted.   Assessment & Plan  No problem-specific Assessment & Plan  notes found for this encounter.

## 2023-06-30 NOTE — Progress Notes (Signed)
Yvonne Brewer     MRN: [id number]      DOB: [birth year]  Chief Complaint  Patient presents with   Follow-up    Arthritis pain, weight loss    HPI Yvonne Brewer is here for follow up and re-evaluation of chronic medical conditions, medication management and review of any available recent lab and radiology data.  Preventive health is updated, specifically  Cancer screening and Immunization.   Questions or concerns regarding consultations or procedures which the PT has had in the interim are  addressed. The PT denies any adverse reactions to current medications since the last visit.  Concerned about inability to lose weight , wants to change this by being more disciplined with eating in particular C/o arthritis pain increased because of weight   ROS: Denies recent fever or chills. Denies sinus pressure, nasal congestion, ear pain or sore throat. Denies chest congestion, productive cough or wheezing. Denies chest pains, palpitations and leg swelling Denies abdominal pain, nausea, vomiting,diarrhea or constipation.   Denies dysuria, frequency, hesitancy or incontinence. Denies headaches, seizures, numbness, or tingling. Denies depression, anxiety or insomnia. Denies skin break down or rash.   PE  BP 128/82   Pulse 86   Ht 5\' 2"  (1.575 m)   Wt 188 lb 1.3 oz (85.3 kg)   SpO2 96%   BMI 34.40 kg/m   Patient alert and oriented and in no cardiopulmonary distress.  HEENT: No facial asymmetry, EOMI,     Neck supple .  Chest: Clear to auscultation bilaterally.  CVS: S1, S2 no murmurs, no S3.Regular rate.  ABD: Soft non tender.   Ext: No edema  MS: Adequate  though reduced ROM spine,  hips and knees.  Skin: Intact, no ulcerations or rash noted.  Psych: Good eye contact, normal affect. Memory intact not anxious or depressed appearing.  CNS: CN 2-12 intact, power,  normal throughout.no focal deficits noted.   Assessment & Plan  Essential hypertension Controlled, no  change in medication DASH diet and commitment to daily physical activity for a minimum of 30 minutes discussed and encouraged, as a part of hypertension management. The importance of attaining a healthy weight is also discussed.     06/25/2023    8:42 AM 06/25/2023    8:25 AM 03/28/2023    8:51 AM 03/20/2023   10:51 AM 03/20/2023   10:50 AM 03/20/2023    9:55 AM 03/10/2023    2:58 PM  BP/Weight  Systolic BP 128 120 120 144 144 102 134  Diastolic BP 82 74 80 92 94 65 70  Wt. (Lbs)  188.08    188.08 190  BMI  34.4 kg/m2    34.4 kg/m2 34.75 kg/m2       Obesity (BMI 30.0-34.9)  Patient re-educated about  the importance of commitment to a  minimum of 150 minutes of exercise per week as able.  The importance of healthy food choices with portion control discussed, as well as eating regularly and within a 12 hour window most days. The need to choose "clean , green" food 50 to 75% of the time is discussed, as well as to make water the primary drink and set a goal of 64 ounces water daily.       06/25/2023    8:25 AM 03/20/2023    9:55 AM 03/10/2023    2:58 PM  Weight /BMI  Weight 188 lb 1.3 oz 188 lb 1.3 oz 190 lb  Height 5\' 2"  (1.575 m) 5\' 2"  (1.575  m) 5\' 2"  (1.575 m)  BMI 34.4 kg/m2 34.4 kg/m2 34.75 kg/m2    Unchanged, will be more diligent with weight loss effort  IGT (impaired glucose tolerance) Patient educated about the importance of limiting  Carbohydrate intake , the need to commit to daily physical activity for a minimum of 30 minutes , and to commit weight loss. The fact that changes in all these areas will reduce or eliminate all together the development of diabetes is stressed.  Improved slightly, which is encouraging     Latest Ref Rng & Units 06/25/2023    9:17 AM 10/03/2022    1:07 PM 06/04/2022    1:54 PM 04/17/2022    8:12 AM 10/19/2021    8:06 AM  Diabetic Labs  HbA1c 4.8 - 5.6 % 5.7  5.8   5.8  5.6   Chol 100 - 199 mg/dL 841  660   630  160   HDL >39 mg/dL 49   53   57  50   Calc LDL 0 - 99 mg/dL 109  323   62  557   Triglycerides 0 - 149 mg/dL 322  025   95  427   Creatinine 0.57 - 1.00 mg/dL 0.62  3.76  2.83  1.51  0.84       06/25/2023    8:42 AM 06/25/2023    8:25 AM 03/28/2023    8:51 AM 03/20/2023   10:51 AM 03/20/2023   10:50 AM 03/20/2023    9:55 AM 03/10/2023    2:58 PM  BP/Weight  Systolic BP 128 120 120 144 144 102 134  Diastolic BP 82 74 80 92 94 65 70  Wt. (Lbs)  188.08    188.08 190  BMI  34.4 kg/m2    34.4 kg/m2 34.75 kg/m2       No data to display            Hyperlipidemia LDL goal <100 Hyperlipidemia:Low fat diet discussed and encouraged.   Lipid Panel  Lab Results  Component Value Date   CHOL 222 (H) 06/25/2023   HDL 49 06/25/2023   LDLCALC 141 (H) 06/25/2023   TRIG 181 (H) 06/25/2023   CHOLHDL 4.5 (H) 06/25/2023     Markedly elevated, needs to change food choice reports stain intolerance  Heartburn Reduced and dependent on caffeine intake and how late she eats, uses PPI as needed only  Tubular adenoma of colon Refer to Dr Jena Gauss , pt preference, for consultation prior to request for  scheduling procedure, she is very functional and in good healt

## 2023-06-30 NOTE — Assessment & Plan Note (Signed)
Controlled, no change in medication DASH diet and commitment to daily physical activity for a minimum of 30 minutes discussed and encouraged, as a part of hypertension management. The importance of attaining a healthy weight is also discussed.     06/25/2023    8:42 AM 06/25/2023    8:25 AM 03/28/2023    8:51 AM 03/20/2023   10:51 AM 03/20/2023   10:50 AM 03/20/2023    9:55 AM 03/10/2023    2:58 PM  BP/Weight  Systolic BP 128 120 120 144 144 102 134  Diastolic BP 82 74 80 92 94 65 70  Wt. (Lbs)  188.08    188.08 190  BMI  34.4 kg/m2    34.4 kg/m2 34.75 kg/m2

## 2023-07-01 ENCOUNTER — Encounter (INDEPENDENT_AMBULATORY_CARE_PROVIDER_SITE_OTHER): Payer: Self-pay | Admitting: *Deleted

## 2023-07-01 DIAGNOSIS — D126 Benign neoplasm of colon, unspecified: Secondary | ICD-10-CM | POA: Insufficient documentation

## 2023-07-01 NOTE — Assessment & Plan Note (Signed)
Reduced and dependent on caffeine intake and how late she eats, uses PPI as needed only

## 2023-07-01 NOTE — Assessment & Plan Note (Addendum)
Refer to Dr Jena Gauss , pt preference, for consultation prior to request for  scheduling procedure, she is very functional and in good healt

## 2023-07-01 NOTE — Assessment & Plan Note (Signed)
Hyperlipidemia:Low fat diet discussed and encouraged.   Lipid Panel  Lab Results  Component Value Date   CHOL 222 (H) 06/25/2023   HDL 49 06/25/2023   LDLCALC 141 (H) 06/25/2023   TRIG 181 (H) 06/25/2023   CHOLHDL 4.5 (H) 06/25/2023     Markedly elevated, needs to change food choice reports stain intolerance

## 2023-07-01 NOTE — Assessment & Plan Note (Signed)
Patient educated about the importance of limiting  Carbohydrate intake , the need to commit to daily physical activity for a minimum of 30 minutes , and to commit weight loss. The fact that changes in all these areas will reduce or eliminate all together the development of diabetes is stressed.  Improved slightly, which is encouraging     Latest Ref Rng & Units 06/25/2023    9:17 AM 10/03/2022    1:07 PM 06/04/2022    1:54 PM 04/17/2022    8:12 AM 10/19/2021    8:06 AM  Diabetic Labs  HbA1c 4.8 - 5.6 % 5.7  5.8   5.8  5.6   Chol 100 - 199 mg/dL 595  638   756  433   HDL >39 mg/dL 49  53   57  50   Calc LDL 0 - 99 mg/dL 295  188   62  416   Triglycerides 0 - 149 mg/dL 606  301   95  601   Creatinine 0.57 - 1.00 mg/dL 0.93  2.35  5.73  2.20  0.84       06/25/2023    8:42 AM 06/25/2023    8:25 AM 03/28/2023    8:51 AM 03/20/2023   10:51 AM 03/20/2023   10:50 AM 03/20/2023    9:55 AM 03/10/2023    2:58 PM  BP/Weight  Systolic BP 128 120 120 144 144 102 134  Diastolic BP 82 74 80 92 94 65 70  Wt. (Lbs)  188.08    188.08 190  BMI  34.4 kg/m2    34.4 kg/m2 34.75 kg/m2       No data to display

## 2023-07-01 NOTE — Assessment & Plan Note (Signed)
  Patient re-educated about  the importance of commitment to a  minimum of 150 minutes of exercise per week as able.  The importance of healthy food choices with portion control discussed, as well as eating regularly and within a 12 hour window most days. The need to choose "clean , green" food 50 to 75% of the time is discussed, as well as to make water the primary drink and set a goal of 64 ounces water daily.       06/25/2023    8:25 AM 03/20/2023    9:55 AM 03/10/2023    2:58 PM  Weight /BMI  Weight 188 lb 1.3 oz 188 lb 1.3 oz 190 lb  Height 5\' 2"  (1.575 m) 5\' 2"  (1.575 m) 5\' 2"  (1.575 m)  BMI 34.4 kg/m2 34.4 kg/m2 34.75 kg/m2    Unchanged, will be more diligent with weight loss effort

## 2023-07-09 ENCOUNTER — Ambulatory Visit: Payer: PPO | Admitting: Orthopaedic Surgery

## 2023-07-09 ENCOUNTER — Other Ambulatory Visit (INDEPENDENT_AMBULATORY_CARE_PROVIDER_SITE_OTHER): Payer: PPO

## 2023-07-09 ENCOUNTER — Encounter: Payer: Self-pay | Admitting: Orthopaedic Surgery

## 2023-07-09 VITALS — Ht 62.0 in | Wt 192.0 lb

## 2023-07-09 DIAGNOSIS — M25532 Pain in left wrist: Secondary | ICD-10-CM

## 2023-07-09 DIAGNOSIS — S52502A Unspecified fracture of the lower end of left radius, initial encounter for closed fracture: Secondary | ICD-10-CM | POA: Diagnosis not present

## 2023-07-09 MED ORDER — HYDROCODONE-ACETAMINOPHEN 5-325 MG PO TABS: ORAL_TABLET | ORAL | 0 refills | Status: AC

## 2023-07-09 NOTE — Progress Notes (Signed)
I broke my wrist.  She was on a cruise in [place] and fell and hurt her left wrist a week ago today.  She arrived back in [place] two days ago, Monday.  She had X-rays in [place] showing a fracture of the left nondominant distal radius. She was given a metal splint.  She was given pain medicine. She has no other injuries.  She was not given her X-rays.    Left wrist is swollen, painful ROM, slight swelling.  NV Intact. She has her rings on but circulation is intact.    X-rays were done, reported separately, of the left distal radius.  Encounter Diagnoses  Name Primary?   Pain in left wrist Yes   Closed traumatic displaced fracture of distal end of left radius, initial encounter    I have shown her the X-rays.  I will arrange for her to see hand surgery at the [facility] office in [place], set for 10 AM Friday, in two days.  I have placed her in a sugar tong splint.  I have called in pain medicine.  I have reviewed the [place] Controlled Substance Reporting System web site prior to prescribing narcotic medicine for this patient.  Elevate, use sling, ice as needed.  Call if any problem.  Precautions discussed.  Electronically Signed Darreld Mclean, MD 11/13/20242:13 PM

## 2023-07-09 NOTE — Progress Notes (Signed)
ambul

## 2023-07-11 ENCOUNTER — Ambulatory Visit (INDEPENDENT_AMBULATORY_CARE_PROVIDER_SITE_OTHER): Payer: PPO | Admitting: Orthopedic Surgery

## 2023-07-11 DIAGNOSIS — S52502A Unspecified fracture of the lower end of left radius, initial encounter for closed fracture: Secondary | ICD-10-CM

## 2023-07-11 NOTE — Progress Notes (Signed)
Yvonne Brewer - 77 y.o. female MRN [id number]  Date of birth: 1946-08-22  Office Visit Note: Visit Date: 07/11/2023 PCP: Yvonne Perches, MD Referred by: Yvonne Perches, MD  Subjective: No chief complaint on file.  HPI: Yvonne Brewer is a pleasant 77 y.o. female who presents today for evaluation of a left wrist injury sustained approximately 10 days prior.  She was on a cruise and sustained a mechanical fall onto her outstretched left wrist.  She was recently seen by Dr. Hilda Brewer in the outpatient setting, underwent clinical and radiographic workup which showed a minimally displaced left distal radius intra-articular fracture.  She was given hand surgery follow-up for evaluation and discussion.  Pertinent ROS were reviewed with the patient and found to be negative unless otherwise specified above in HPI.   Visit Reason:left wrist fracture Duration of symptoms: 1+ week Hand dominance: right Occupation: retired Diabetic: No Smoking: No Heart/Lung History:none Blood Thinners: none  Prior Testing/EMG: xrays 11/14  Injections (Date): none Treatments: splint Prior Surgery: none  Assessment & Plan: Visit Diagnoses: No diagnosis found.  Plan: Extensive discussion was had the patient today regarding her left distal radius fracture.  I personally reviewed her x-rays with her in detail today which do show intra-articular radial of the distal radius fracture without significant displacement, angulation or shortening.  We discussed the intra-articular nature of the fracture and the possibility for posttraumatic arthritis with nonoperative treatment, however given her age, there is an element of underlying arthritis that does already exist.  We discussed treatment modalities ranging from conservative to surgical.  From a conservative standpoint, we discussed immobilization with casting, routine follow-up to observe fracture healing and slow progression to activities based on  healing.  We also discussed surgical intervention in the form of open reduction and internal fixation as well as all risks and benefits associated.  I did emphasize the minimally displaced nature of her injury and the ability for her to heal this fracture nonoperatively.  We discussed the possibility for posttraumatic arthritis as well as potential loss of range of motion should this be treated nonoperatively.  Understanding all risk and benefits, she would like to continue with nonoperative care with routine immobilization in the form of casting which I am in agreement with.  Will have close follow-up to repeat x-rays in the cast and ensure there is no interval fracture displacement and appropriate healing occurs.  She expressed full understanding, short arm cast will be placed today, patient will follow-up in 1 week for repeat x-ray.  Follow-up: No follow-ups on file.   Meds & Orders: No orders of the defined types were placed in this encounter.  No orders of the defined types were placed in this encounter.    Procedures: No procedures performed      Clinical History: No specialty comments available.  She reports that she has never smoked. She has never used smokeless tobacco.  Recent Labs    10/03/22 1307 06/25/23 0917  HGBA1C 5.8* 5.7*    Objective:   Vital Signs: There were no vitals taken for this visit.  Physical Exam  Gen: Well-appearing, in no acute distress; non-toxic CV: Regular Rate. Well-perfused. Warm.  Resp: Breathing unlabored on room air; no wheezing. Psych: Fluid speech in conversation; appropriate affect; normal thought process  Ortho Exam Left wrist: - Skin is intact, hand is warm well-perfused, tenderness associated at the distal radius region consistent with the radiographic workup - No tenderness about the proximal forearm or  elbow region - Sensation intact in all distributions  Imaging: No results found.  Past Medical/Family/Surgical/Social  History: Medications & Allergies reviewed per EMR, new medications updated. Patient Active Problem List   Diagnosis Date Noted   Tubular adenoma of colon 07/01/2023   Sore throat 03/21/2023   Viral URI with cough 07/10/2022   IGT (impaired glucose tolerance) 04/24/2022   Abnormal EKG 03/12/2022   Varicose veins of leg with swelling 03/18/2021   Left shoulder pain 07/10/2020   Heartburn 07/07/2019   Fracture, foot, left, sequela 02/09/2018   Dyspepsia 04/18/2017   Osteoarthritis of both hips 04/18/2017   History of colonic polyps 11/13/2016   Pseudophakia 11/12/2016   Urinary incontinence in female 07/29/2016   Environmental allergies 07/14/2016   Annual visit for general adult medical examination with abnormal findings 02/21/2016   Chronic radicular pain of lower back 03/13/2014   Reduced vision 03/13/2014   Essential hypertension 03/08/2014   Obesity (BMI 30.0-34.9) 02/19/2010   Vitamin D deficiency 07/27/2009   Hyperlipidemia LDL goal <100 02/19/2008   Disorder of bladder 02/19/2008   Past Medical History:  Diagnosis Date   Essential hypertension 2015   Female bladder prolapse    History of cystitis    History of skin cancer    Hyperlipidemia    Obesity    Family History  Problem Relation Age of Onset   Stroke Mother    Hypertension Mother    Heart disease Father    Cancer Maternal Grandmother    Anesthesia problems Neg Hx    Hypotension Neg Hx    Malignant hyperthermia Neg Hx    Pseudochol deficiency Neg Hx    Past Surgical History:  Procedure Laterality Date   CATARACT EXTRACTION  2012   bilateral    COLONOSCOPY  11/06/2011   Procedure: COLONOSCOPY;  Surgeon: Malissa Hippo, MD;  [facility];  Service: Endoscopy;  Laterality: N/A;  7:30   COLONOSCOPY N/A 05/21/2017   Procedure: COLONOSCOPY;  Surgeon: Malissa Hippo, MD;  [facility];  Service: Endoscopy;  Laterality: N/A;  730-moved to 9/26 @10 :30am per [facility]   EYE SURGERY  2011    bilateral cataract with implants   HAMMER TOE SURGERY Left 01/2016   Left bunionectomy     Squamous cell ca rt ant chest 1989     YAG LASER APPLICATION Left 10/24/2015   Procedure: YAG LASER APPLICATION;  Surgeon: Jethro Bolus, MD;  Location: [facility];  Service: Ophthalmology;  Laterality: Left;   YAG LASER APPLICATION Right 11/07/2015   Procedure: YAG LASER APPLICATION;  Surgeon: Jethro Bolus, MD;  Location: [facility];  Service: Ophthalmology;  Laterality: Right;   Social History   Occupational History   Not on file  Tobacco Use   Smoking status: Never   Smokeless tobacco: Never  Vaping Use   Vaping status: Never Used  Substance and Sexual Activity   Alcohol use: Yes    Comment: glass of red wine most nights or scotch and water   Drug use: No   Sexual activity: Not Currently    Birth control/protection: Post-menopausal    Nilda Keathley Fara Boros) Denese Killings, M.D. [facility] 5:00 PM

## 2023-07-18 ENCOUNTER — Other Ambulatory Visit (INDEPENDENT_AMBULATORY_CARE_PROVIDER_SITE_OTHER): Payer: Self-pay

## 2023-07-18 ENCOUNTER — Ambulatory Visit (INDEPENDENT_AMBULATORY_CARE_PROVIDER_SITE_OTHER): Payer: PPO | Admitting: Orthopedic Surgery

## 2023-07-18 DIAGNOSIS — S52502A Unspecified fracture of the lower end of left radius, initial encounter for closed fracture: Secondary | ICD-10-CM

## 2023-07-18 NOTE — Progress Notes (Signed)
MARLEAN Brewer - 77 y.o. female MRN [id number]  Date of birth: 1946-03-17  Office Visit Note: Visit Date: 07/18/2023 PCP: Kerri Perches, MD Referred by: Kerri Perches, MD  Subjective: No chief complaint on file.  HPI: Yvonne Brewer is a pleasant 77 y.o. female who presents today for follow-up of a left wrist distal radius fracture being managed nonoperatively with casting.  She has been compliant with casting, pain is well-controlled.  Pertinent ROS were reviewed with the patient and found to be negative unless otherwise specified above in HPI.     Assessment & Plan: Visit Diagnoses:  1. Closed traumatic displaced fracture of distal end of left radius, initial encounter     Plan: Extensive discussion was once again had the patient today regarding her left distal radius fracture.  I personally reviewed her repeat x-rays with her in detail today which do show intra-articular radial of the distal radius fracture without significant displacement, angulation or shortening.  We discussed the intra-articular nature of the fracture and the possibility for posttraumatic arthritis and loss of range of motion with nonoperative treatment, however given her age, there is an element of underlying arthritis that does already exist.  We discussed treatment modalities ranging from conservative to surgical.  From a conservative standpoint, we discussed immobilization with casting, routine follow-up to observe fracture healing and slow progression to activities based on healing.  We also discussed surgical intervention in the form of open reduction and internal fixation as well as all risks and benefits associated.  I did emphasize the minimally displaced nature of her injury and the ability for her to heal this fracture nonoperatively.  We discussed the possibility for posttraumatic arthritis as well as potential loss of range of motion should this be treated nonoperatively.   Understanding all risk and benefits, she would like to continue with nonoperative care with routine immobilization in the form of casting which I am in agreement with.  Will have close follow-up to repeat x-rays in the cast and ensure there is no interval fracture displacement and appropriate healing occurs.  She expressed full understanding, short arm cast remains well-fitting, patient will follow-up in 2 week for repeat x-ray and cast change.  Follow-up: No follow-ups on file.   Meds & Orders: No orders of the defined types were placed in this encounter.   Orders Placed This Encounter  Procedures   XR Wrist Complete Left     Procedures: No procedures performed      Clinical History: No specialty comments available.  She reports that she has never smoked. She has never used smokeless tobacco.  Recent Labs    10/03/22 1307 06/25/23 0917  HGBA1C 5.8* 5.7*    Objective:   Vital Signs: There were no vitals taken for this visit.  Physical Exam  Gen: Well-appearing, in no acute distress; non-toxic CV: Regular Rate. Well-perfused. Warm.  Resp: Breathing unlabored on room air; no wheezing. Psych: Fluid speech in conversation; appropriate affect; normal thought process  Ortho Exam Left wrist: - Digits well exposed, digital range of motion is intact, cast remains well-fitting - No tenderness about the proximal forearm or elbow region - Sensation intact in all distributions  Imaging: XR Wrist Complete Left  Result Date: 07/18/2023 X-rays of the left wrist, [facility] and lateral views obtained today X-rays demonstrate stable appearance of the distal radius fracture without interval displacement from the prior x-rays.  There is intra-articular involvement without significant displacement, angulation.  Distal radius is  in slightly shortened position with ulnar positivity.   Past Medical/Family/Surgical/Social History: Medications & Allergies reviewed per EMR, new medications  updated. Patient Active Problem List   Diagnosis Date Noted   Tubular adenoma of colon 07/01/2023   Sore throat 03/21/2023   Viral URI with cough 07/10/2022   IGT (impaired glucose tolerance) 04/24/2022   Abnormal EKG 03/12/2022   Varicose veins of leg with swelling 03/18/2021   Left shoulder pain 07/10/2020   Heartburn 07/07/2019   Fracture, foot, left, sequela 02/09/2018   Dyspepsia 04/18/2017   Osteoarthritis of both hips 04/18/2017   History of colonic polyps 11/13/2016   Pseudophakia 11/12/2016   Urinary incontinence in female 07/29/2016   Environmental allergies 07/14/2016   Annual visit for general adult medical examination with abnormal findings 02/21/2016   Chronic radicular pain of lower back 03/13/2014   Reduced vision 03/13/2014   Essential hypertension 03/08/2014   Obesity (BMI 30.0-34.9) 02/19/2010   Vitamin D deficiency 07/27/2009   Hyperlipidemia LDL goal <100 02/19/2008   Disorder of bladder 02/19/2008   Past Medical History:  Diagnosis Date   Essential hypertension 2015   Female bladder prolapse    History of cystitis    History of skin cancer    Hyperlipidemia    Obesity    Family History  Problem Relation Age of Onset   Stroke Mother    Hypertension Mother    Heart disease Father    Cancer Maternal Grandmother    Anesthesia problems Neg Hx    Hypotension Neg Hx    Malignant hyperthermia Neg Hx    Pseudochol deficiency Neg Hx    Past Surgical History:  Procedure Laterality Date   CATARACT EXTRACTION  2012   bilateral    COLONOSCOPY  11/06/2011   Procedure: COLONOSCOPY;  Surgeon: Malissa Hippo, MD;  [facility];  Service: Endoscopy;  Laterality: N/A;  7:30   COLONOSCOPY N/A 05/21/2017   Procedure: COLONOSCOPY;  Surgeon: Malissa Hippo, MD;  [facility];  Service: Endoscopy;  Laterality: N/A;  730-moved to 9/26 @10 :30am per [facility]   EYE SURGERY  2011   bilateral cataract with implants   HAMMER TOE SURGERY Left 01/2016    Left bunionectomy     Squamous cell ca rt ant chest 1989     YAG LASER APPLICATION Left 10/24/2015   Procedure: YAG LASER APPLICATION;  Surgeon: Jethro Bolus, MD;  Location: [facility];  Service: Ophthalmology;  Laterality: Left;   YAG LASER APPLICATION Right 11/07/2015   Procedure: YAG LASER APPLICATION;  Surgeon: Jethro Bolus, MD;  Location: [facility];  Service: Ophthalmology;  Laterality: Right;   Social History   Occupational History   Not on file  Tobacco Use   Smoking status: Never   Smokeless tobacco: Never  Vaping Use   Vaping status: Never Used  Substance and Sexual Activity   Alcohol use: Yes    Comment: glass of red wine most nights or scotch and water   Drug use: No   Sexual activity: Not Currently    Birth control/protection: Post-menopausal    Nadene Witherspoon Fara Boros) Denese Killings, M.D. [facility] 8:43 AM

## 2023-08-01 ENCOUNTER — Other Ambulatory Visit (INDEPENDENT_AMBULATORY_CARE_PROVIDER_SITE_OTHER): Payer: Self-pay

## 2023-08-01 ENCOUNTER — Ambulatory Visit (INDEPENDENT_AMBULATORY_CARE_PROVIDER_SITE_OTHER): Payer: PPO | Admitting: Orthopedic Surgery

## 2023-08-01 DIAGNOSIS — S52502A Unspecified fracture of the lower end of left radius, initial encounter for closed fracture: Secondary | ICD-10-CM | POA: Diagnosis not present

## 2023-08-01 NOTE — Progress Notes (Signed)
RHYDER GRANDIN - 77 y.o. female MRN [id number]  Date of birth: Oct 29, 1945  Office Visit Note: Visit Date: 08/01/2023 PCP: Kerri Perches, MD Referred by: Kerri Perches, MD  Subjective: No chief complaint on file.  HPI: BRIDIE BOATMAN is a pleasant 77 y.o. female who presents today for follow-up of a left wrist distal radius fracture being managed nonoperatively with casting.  Injury approximate 4 weeks prior.  She has been compliant with casting, pain is well-controlled.  Pertinent ROS were reviewed with the patient and found to be negative unless otherwise specified above in HPI.     Assessment & Plan: Visit Diagnoses:  1. Closed traumatic displaced fracture of distal end of left radius, initial encounter     Plan: Extensive discussion was once again had the patient today regarding her left distal radius fracture.  I personally reviewed her repeat x-rays with her in detail today which do show intra-articular radial of the distal radius fracture there is some interval displacement of the ulnar corner, with joint step-off.  We once again discussed the intra-articular nature of the fracture and the possibility for posttraumatic arthritis and loss of range of motion with nonoperative treatment, however given her age, and minimal demands with the left wrist, she would like to continue with nonoperative care.  We discussed treatment modalities ranging from conservative to surgical.  From a conservative standpoint, we discussed ongoing immobilization with casting, routine follow-up to observe fracture healing and slow progression to activities based on healing.  We also discussed did surgical intervention in the form of open reduction and internal fixation as well as all risks and benefits associated.  Understanding all risk and benefits, she would like to continue with nonoperative care with routine immobilization in the form of casting. Patient will follow-up in 3 week for  repeat x-ray and cast change.  Follow-up: No follow-ups on file.   Meds & Orders: No orders of the defined types were placed in this encounter.   Orders Placed This Encounter  Procedures   XR Wrist Complete Left     Procedures: No procedures performed      Clinical History: No specialty comments available.  She reports that she has never smoked. She has never used smokeless tobacco.  Recent Labs    10/03/22 1307 06/25/23 0917  HGBA1C 5.8* 5.7*    Objective:   Vital Signs: There were no vitals taken for this visit.  Physical Exam  Gen: Well-appearing, in no acute distress; non-toxic CV: Regular Rate. Well-perfused. Warm.  Resp: Breathing unlabored on room air; no wheezing. Psych: Fluid speech in conversation; appropriate affect; normal thought process  Ortho Exam Left wrist: - Digits well exposed, digital range of motion is intact, cast remains well-fitting - No tenderness about the proximal forearm or elbow region - Sensation intact in all distributions - Wrist range of motion flexion 40 degrees, extension 15 degrees without significant pain - Able to form composite fist - Thumb EPL/FPL fully intact  Imaging: XR Wrist Complete Left  Result Date: 08/01/2023 X-rays of the left wrist, multiple views were obtained today X-rays demonstrate previously known intra-articular distal radius fracture, there is mild interval displacement of the ulnar column of the distal radius fracture at the articular segment with notable joint step-off at the intra-articular surface, approximately 2 mm.   Past Medical/Family/Surgical/Social History: Medications & Allergies reviewed per EMR, new medications updated. Patient Active Problem List   Diagnosis Date Noted   Tubular adenoma of colon 07/01/2023  Sore throat 03/21/2023   Viral URI with cough 07/10/2022   IGT (impaired glucose tolerance) 04/24/2022   Abnormal EKG 03/12/2022   Varicose veins of leg with swelling 03/18/2021    Left shoulder pain 07/10/2020   Heartburn 07/07/2019   Fracture, foot, left, sequela 02/09/2018   Dyspepsia 04/18/2017   Osteoarthritis of both hips 04/18/2017   History of colonic polyps 11/13/2016   Pseudophakia 11/12/2016   Urinary incontinence in female 07/29/2016   Environmental allergies 07/14/2016   Annual visit for general adult medical examination with abnormal findings 02/21/2016   Chronic radicular pain of lower back 03/13/2014   Reduced vision 03/13/2014   Essential hypertension 03/08/2014   Obesity (BMI 30.0-34.9) 02/19/2010   Vitamin D deficiency 07/27/2009   Hyperlipidemia LDL goal <100 02/19/2008   Disorder of bladder 02/19/2008   Past Medical History:  Diagnosis Date   Essential hypertension 2015   Female bladder prolapse    History of cystitis    History of skin cancer    Hyperlipidemia    Obesity    Family History  Problem Relation Age of Onset   Stroke Mother    Hypertension Mother    Heart disease Father    Cancer Maternal Grandmother    Anesthesia problems Neg Hx    Hypotension Neg Hx    Malignant hyperthermia Neg Hx    Pseudochol deficiency Neg Hx    Past Surgical History:  Procedure Laterality Date   CATARACT EXTRACTION  2012   bilateral    COLONOSCOPY  11/06/2011   Procedure: COLONOSCOPY;  Surgeon: Malissa Hippo, MD;  [facility];  Service: Endoscopy;  Laterality: N/A;  7:30   COLONOSCOPY N/A 05/21/2017   Procedure: COLONOSCOPY;  Surgeon: Malissa Hippo, MD;  [facility];  Service: Endoscopy;  Laterality: N/A;  730-moved to 9/26 @10 :30am per [facility]   EYE SURGERY  2011   bilateral cataract with implants   HAMMER TOE SURGERY Left 01/2016   Left bunionectomy     Squamous cell ca rt ant chest 1989     YAG LASER APPLICATION Left 10/24/2015   Procedure: YAG LASER APPLICATION;  Surgeon: Jethro Bolus, MD;  Location: [facility];  Service: Ophthalmology;  Laterality: Left;   YAG LASER APPLICATION Right 11/07/2015   Procedure: YAG  LASER APPLICATION;  Surgeon: Jethro Bolus, MD;  Location: [facility];  Service: Ophthalmology;  Laterality: Right;   Social History   Occupational History   Not on file  Tobacco Use   Smoking status: Never   Smokeless tobacco: Never  Vaping Use   Vaping status: Never Used  Substance and Sexual Activity   Alcohol use: Yes    Comment: glass of red wine most nights or scotch and water   Drug use: No   Sexual activity: Not Currently    Birth control/protection: Post-menopausal    Lezli Danek Fara Boros) Denese Killings, M.D. [facility] 10:20 AM

## 2023-08-05 DIAGNOSIS — L570 Actinic keratosis: Secondary | ICD-10-CM | POA: Diagnosis not present

## 2023-08-05 DIAGNOSIS — X32XXXD Exposure to sunlight, subsequent encounter: Secondary | ICD-10-CM | POA: Diagnosis not present

## 2023-08-05 DIAGNOSIS — D225 Melanocytic nevi of trunk: Secondary | ICD-10-CM | POA: Diagnosis not present

## 2023-08-11 ENCOUNTER — Telehealth: Payer: Self-pay | Admitting: Radiology

## 2023-08-11 NOTE — Telephone Encounter (Signed)
Patient was put in cast on 08/01/2023.  She states that the swelling seems to be getting worse instead of better. She is also complaining of her wrist being more painful, causing her to have difficulty sleeping at times. She does state that she is elevating higher then her heart, however, it does not seem to help as much as it did with previous cast. She is more concerned that it seems worse rather than better.   Please advise.    CB [phone]

## 2023-08-11 NOTE — Telephone Encounter (Signed)
Patient worked in to schedule on Wednesday morning, 08/13/2023 at 0930.  Advised to keep elevated higher than heart as much as she can. Patient expressed understanding.

## 2023-08-13 ENCOUNTER — Ambulatory Visit (INDEPENDENT_AMBULATORY_CARE_PROVIDER_SITE_OTHER): Payer: PPO | Admitting: Orthopedic Surgery

## 2023-08-13 ENCOUNTER — Encounter: Payer: Self-pay | Admitting: Rehabilitative and Restorative Service Providers"

## 2023-08-13 ENCOUNTER — Ambulatory Visit (INDEPENDENT_AMBULATORY_CARE_PROVIDER_SITE_OTHER): Payer: PPO | Admitting: Rehabilitative and Restorative Service Providers"

## 2023-08-13 ENCOUNTER — Ambulatory Visit: Payer: Self-pay

## 2023-08-13 DIAGNOSIS — R600 Localized edema: Secondary | ICD-10-CM

## 2023-08-13 DIAGNOSIS — M25532 Pain in left wrist: Secondary | ICD-10-CM

## 2023-08-13 DIAGNOSIS — M25632 Stiffness of left wrist, not elsewhere classified: Secondary | ICD-10-CM

## 2023-08-13 DIAGNOSIS — S52502A Unspecified fracture of the lower end of left radius, initial encounter for closed fracture: Secondary | ICD-10-CM | POA: Diagnosis not present

## 2023-08-13 DIAGNOSIS — M6281 Muscle weakness (generalized): Secondary | ICD-10-CM | POA: Diagnosis not present

## 2023-08-13 NOTE — Therapy (Signed)
OUTPATIENT OCCUPATIONAL THERAPY ORTHO EVALUATION  Patient Name: Yvonne Brewer MRN: [id number] DOB:1945/12/05, 77 y.o., female Today's Date: 08/13/2023  PCP: Syliva Overman, MD REFERRING PROVIDER: Samuella Cota, MD   END OF SESSION:  OT End of Session - 08/13/23 1149     Visit Number 1    Number of Visits 12    Date for OT Re-Evaluation 09/26/23    Authorization Type [employer]    OT Start Time 1150    OT Stop Time 1228    OT Time Calculation (min) 38 min    Equipment Utilized During Treatment orthotic materials    Activity Tolerance Patient tolerated treatment well;No increased pain;Patient limited by fatigue;Patient limited by pain    Behavior During Therapy [facility] for tasks assessed/performed             Past Medical History:  Diagnosis Date   Essential hypertension 2015   Female bladder prolapse    History of cystitis    History of skin cancer    Hyperlipidemia    Obesity    Past Surgical History:  Procedure Laterality Date   CATARACT EXTRACTION  2012   bilateral    COLONOSCOPY  11/06/2011   Procedure: COLONOSCOPY;  Surgeon: Malissa Hippo, MD;  [facility];  Service: Endoscopy;  Laterality: N/A;  7:30   COLONOSCOPY N/A 05/21/2017   Procedure: COLONOSCOPY;  Surgeon: Malissa Hippo, MD;  [facility];  Service: Endoscopy;  Laterality: N/A;  730-moved to 9/26 @10 :30am per [facility]   EYE SURGERY  2011   bilateral cataract with implants   HAMMER TOE SURGERY Left 01/2016   Left bunionectomy     Squamous cell ca rt ant chest 1989     YAG LASER APPLICATION Left 10/24/2015   Procedure: YAG LASER APPLICATION;  Surgeon: Jethro Bolus, MD;  Location: [facility];  Service: Ophthalmology;  Laterality: Left;   YAG LASER APPLICATION Right 11/07/2015   Procedure: YAG LASER APPLICATION;  Surgeon: Jethro Bolus, MD;  Location: [facility];  Service: Ophthalmology;  Laterality: Right;   Patient Active Problem List   Diagnosis Date Noted   Tubular  adenoma of colon 07/01/2023   Sore throat 03/21/2023   Viral URI with cough 07/10/2022   IGT (impaired glucose tolerance) 04/24/2022   Abnormal EKG 03/12/2022   Varicose veins of leg with swelling 03/18/2021   Left shoulder pain 07/10/2020   Heartburn 07/07/2019   Fracture, foot, left, sequela 02/09/2018   Dyspepsia 04/18/2017   Osteoarthritis of both hips 04/18/2017   History of colonic polyps 11/13/2016   Pseudophakia 11/12/2016   Urinary incontinence in female 07/29/2016   Environmental allergies 07/14/2016   Annual visit for general adult medical examination with abnormal findings 02/21/2016   Chronic radicular pain of lower back 03/13/2014   Reduced vision 03/13/2014   Essential hypertension 03/08/2014   Obesity (BMI 30.0-34.9) 02/19/2010   Vitamin D deficiency 07/27/2009   Hyperlipidemia LDL goal <100 02/19/2008   Disorder of bladder 02/19/2008    ONSET DATE: Approximately 07/02/2023  REFERRING DIAG: M25.532 (ICD-10-CM) - Pain in left wrist   THERAPY DIAG:  Localized edema  Muscle weakness (generalized)  Pain in left wrist  Stiffness of left wrist, not elsewhere classified  Rationale for Evaluation and Treatment: Rehabilitation  SUBJECTIVE:   SUBJECTIVE STATEMENT: She states  falling on a cruise, breaking her Lt non-dom arm.  This was about 6 weeks ago.  She met with our hand surgeon and orthopedic specialist today who did not  recommend any surgery but rather conservative treatment.  She is not having significant pain when immobilized at rest.  Having no significant numbness or paresthesia.  She does admit that she is going on a vacation soon for the holidays, and she may not be able to return for 2 weeks.  OT urges her to use caution and keep her arm immobilized and protected during this trip   PERTINENT HISTORY: Per MD note: "left wrist distal radius fracture being managed nonoperatively with casting.  Injury approximate 6 weeks prior. "   PRECAUTIONS:  None  RED FLAGS: None   WEIGHT BEARING RESTRICTIONS: Yes nonweightbearing left arm  PAIN:  Are you having pain? Yes: NPRS scale: 2-3/10 Pain location: Left wrist Pain description: Aching Aggravating factors: Attempted motion or weightbearing Relieving factors: Rest  FALLS: Has patient fallen in last 6 months? Yes. Number of falls 1 (not a fall risk)   LIVING ENVIRONMENT: Lives with: lives with their spouse Lives in: House/apartment Has following equipment at home: None  PLOF: Independent  PATIENT GOALS: To improve the use of her left nondominant arm    OBJECTIVE: (All objective assessments below are from initial evaluation on: 08/13/23 unless otherwise specified.)   HAND DOMINANCE: Right  ADLs: Overall ADLs: States decreased ability to grab, hold household objects, pain and difficulty to open containers, perform FMS tasks (manipulate fasteners on clothing), mild to moderate bathing problems as well.    FUNCTIONAL OUTCOME MEASURES: Eval: Quick DASH TBD% impairment today  (Higher % Score  =  More Impairment)     UPPER EXTREMITY ROM     Shoulder to Wrist AROM Left eval  Elbow flexion full  Elbow extension full  Forearm supination 31  Forearm pronation  85  Wrist flexion 18  Wrist extension 23  Wrist ulnar deviation   Wrist radial deviation   Functional dart thrower's motion (F-DTM) in ulnar flexion   F-DTM in radial extension    (Blank rows = not tested)   Hand AROM Left eval  Full Fist Ability (or Gap to Distal Palmar Crease) 3.8cm  Thumb Opposition  (Kapandji Scale)  5/10  (Blank rows = not tested)   UPPER EXTREMITY MMT:    Eval:  NT at eval due to recent and still healing injuries. Will be tested when appropriate.   MMT Right TBD Left TBD  Shoulder flexion    Shoulder abduction    Shoulder adduction    Shoulder extension    Shoulder internal rotation    Shoulder external rotation    Middle trapezius    Lower trapezius    Elbow flexion     Elbow extension    Forearm supination    Forearm pronation    Wrist flexion    Wrist extension    Wrist ulnar deviation    Wrist radial deviation    (Blank rows = not tested)  HAND FUNCTION: Eval: Observed weakness in affected left hand.  Details will be tested when able and safe Grip strength Right: TBD lbs, Left: TBD lbs   COORDINATION: Eval: Observed coordination impairments with affected left hand.  Details will be tested when able Box and Blocks Test: TBD blocks today (TBD is [facility]) SENSATION: Eval:  Light touch intact today  EDEMA:   Eval:  Mildly swollen in left hand and wrist today  COGNITION: Eval: Overall cognitive status: WFL for evaluation today   OBSERVATIONS:   Eval: Has some sharp pain when attempting to move her wrist or her fingers today, is  fairly swollen and tender to touch but when wrist is immobilized she is doing very well.   TODAY'S TREATMENT:  Post-evaluation treatment:   She was given the following self-care/safety information to perform no weightbearing through her left hand or arm for the next month.  She can gently massage the muscles of her forearm to help alleviate stress through her arm.  To help manage her swelling, she was cautioned to not let her hand hanging down, try to let it rest at the level of her heart or above  Custom orthotic fabrication was indicated due to pt's broken and healing left wrist and need for safe, functional positioning. OT fabricated custom wrist immobilization orthosis for pt today to protect the wrist. It fit well with no areas of pressure, pt states a comfortable fit. Pt was educated on the wearing schedule (on at all times except for hygiene and exercises), to avoid exposing it to sources of heat, to wipe clean as needed (do not wash, use harsh detergents), to call or come in ASAP if it is causing any irritation or is not achieving desired function. It will be checked/adjusted in upcoming sessions, as needed. Pt states  understanding all directions.    Additionally she was given the following home exercise program to perform in a nonpainful fashion at least 4 times a day gentle range of motion to slight tension at endpoints.  These were explained to her, demonstrated to her and she demonstrates back with no significant pain.  She has no questions or significant pain at the end of the session.   Exercises - Reach arms upward   - 4 x daily - 10 reps - Turn [employer] Facing Up & Down  - 4-6 x daily - 10-15 reps - Bend and Pull Back Wrist SLOWLY  - 4 x daily - 10-15 reps - "Windshield Wipers"   - 4 x daily - 10-15 reps - Finger Spreading  - 4-6 x daily - 10-15 reps - Tendon Glides  - 4-6 x daily - 3-5 reps - 2-3 seconds hold - Thumb Opposition  - 4-6 x daily - 10 reps   PATIENT EDUCATION: Education details: See tx section above for details  Person educated: Patient Education method: Verbal Instruction, Teach back, Handouts  Education comprehension: States and demonstrates understanding, Additional Education required    HOME EXERCISE PROGRAM: Access Code: [email or web address] URL: [email or web address] Date: 08/13/2023 Prepared by: [name]   GOALS: Goals reviewed with patient? Yes   SHORT TERM GOALS: (STG required if POC>30 days) Target Date: 08/29/2023  Pt will obtain protective, custom orthotic. Goal status: MET   2.  Pt will demo/state understanding of initial HEP to improve pain levels and prerequisite motion. Goal status: INITIAL   LONG TERM GOALS: Target Date: 09/26/2023  Pt will improve functional ability by decreased impairment per Quick DASH assessment from TBD to 15% or better, for better quality of life. Goal status: INITIAL  2.  Pt will improve grip strength in left hand to at least 30 lbs for functional use at home and in IADLs. Goal status: INITIAL  3.  Pt will improve A/ROM in left wrist flexion/extension from 18/23 to at least 60 degrees each, to have functional  motion for tasks like reach and grasp.  Goal status: INITIAL  4.  Pt will improve strength in left wrist flexion/extension to at least 4+/5 MMT to have increased functional ability to carry out selfcare and higher-level homecare tasks with less difficulty. Goal status: INITIAL  5.  Pt will improve coordination skills in left hand and arm, as seen by within functional limit score on box and blocks testing to have increased functional ability to carry out fine motor tasks (fasteners, etc.) and more complex, coordinated IADLs (meal prep, sports, etc.).  Goal status: INITIAL  6.  Pt will decrease pain at rest from 3/10 to 1/10 or better to have better sleep and occupational participation in daily roles. Goal status: INITIAL  ASSESSMENT:  CLINICAL IMPRESSION: Patient is a 77 y.o. female who was seen today for occupational therapy evaluation for left wrist fracture and subsequent instability, pain, weakness, swelling, decreased functional ability.  She will benefit from outpatient occupational therapy to decrease symptoms and increase quality of life.   PERFORMANCE DEFICITS: in functional skills including ADLs, IADLs, coordination, dexterity, edema, ROM, strength, pain, fascial restrictions, muscle spasms, flexibility, Gross motor control, body mechanics, endurance, decreased knowledge of precautions, and UE functional use, cognitive skills including problem solving and safety awareness, and psychosocial skills including coping strategies, environmental adaptation, and habits.   IMPAIRMENTS: are limiting patient from ADLs, IADLs, leisure, and social participation.   COMORBIDITIES: may have co-morbidities  that affects occupational performance. Patient will benefit from skilled OT to address above impairments and improve overall function.  MODIFICATION OR ASSISTANCE TO COMPLETE EVALUATION: No modification of tasks or assist necessary to complete an evaluation.  OT OCCUPATIONAL PROFILE AND HISTORY:  Problem focused assessment: Including review of records relating to presenting problem.  CLINICAL DECISION MAKING: LOW - limited treatment options, no task modification necessary  REHAB POTENTIAL: Good  EVALUATION COMPLEXITY: Low      PLAN:  OT FREQUENCY: 1-2x/week  OT DURATION: 6 weeks through 09/26/2023 and up to 12 total visits as needed  PLANNED INTERVENTIONS: 97168 OT Re-evaluation, 97535 self care/ADL training, 40981 therapeutic exercise, 97530 therapeutic activity, 97112 neuromuscular re-education, 97140 manual therapy, 97035 ultrasound, 97039 fluidotherapy, 97010 moist heat, 97010 cryotherapy, 97760 Orthotics management and training, 19147 Splinting (initial encounter), [id number] Subsequent splinting/medication, Dry needling, coping strategies training, and patient/family education  RECOMMENDED OTHER SERVICES: None now  CONSULTED AND AGREED WITH PLAN OF CARE: Patient  PLAN FOR NEXT SESSION:   Check orthosis as needed, check initial recommendations and upgrade to light stretches when tolerated   [name], OTR/L, CHT 08/13/2023, 12:57 PM

## 2023-08-13 NOTE — Progress Notes (Signed)
Yvonne Brewer - 77 y.o. female MRN [id number]  Date of birth: Jul 23, 1946  Office Visit Note: Visit Date: 08/13/2023 PCP: Kerri Perches, MD Referred by: Kerri Perches, MD  Subjective: No chief complaint on file.  HPI: Yvonne Brewer is a pleasant 77 y.o. female who presents today for follow-up of a left wrist distal radius fracture being managed nonoperatively with casting.  Injury approximate 6 weeks prior.  She has been compliant with casting, pain is well-controlled.  Pertinent ROS were reviewed with the patient and found to be negative unless otherwise specified above in HPI.     Assessment & Plan: Visit Diagnoses:  1. Pain in left wrist     Plan: Extensive discussion was once again had the patient today regarding her left distal radius fracture.  I personally reviewed her repeat x-rays with her in detail today which do show appropriate interval healing of the intra-articular distal radius fracture.  As mentioned previously, there is intra-articular involvement as well as shortening of the distal radius, at this point there is some mild residual ulnar positivity as well.  Given her appropriate healing to date, we can transition to a fabricated orthosis by occupational therapy.  She will see occupational therapy later today to have this fitted.  We can begin gentle range of motion exercises of the hand and wrist at this point, will wait until the week 8 mark to begin strengthening.  We will plan on seeing her back in approximate 6 weeks time for clinical and radiographic recheck.  Follow-up: No follow-ups on file.   Meds & Orders: No orders of the defined types were placed in this encounter.   Orders Placed This Encounter  Procedures   XR Wrist Complete Left   Ambulatory referral to Occupational Therapy     Procedures: No procedures performed      Clinical History: No specialty comments available.  She reports that she has never smoked. She has never  used smokeless tobacco.  Recent Labs    10/03/22 1307 06/25/23 0917  HGBA1C 5.8* 5.7*    Objective:   Vital Signs: There were no vitals taken for this visit.  Physical Exam  Gen: Well-appearing, in no acute distress; non-toxic CV: Regular Rate. Well-perfused. Warm.  Resp: Breathing unlabored on room air; no wheezing. Psych: Fluid speech in conversation; appropriate affect; normal thought process  Ortho Exam Left wrist: - Digits well exposed, digital range of motion is intact, cast remains well-fitting - No tenderness about the proximal forearm or elbow region - Sensation intact in all distributions - Wrist range of motion flexion 40 degrees, extension 15 degrees without significant pain - Able to form composite fist - Thumb EPL/FPL fully intact  Imaging: No results found.  Past Medical/Family/Surgical/Social History: Medications & Allergies reviewed per EMR, new medications updated. Patient Active Problem List   Diagnosis Date Noted   Tubular adenoma of colon 07/01/2023   Sore throat 03/21/2023   Viral URI with cough 07/10/2022   IGT (impaired glucose tolerance) 04/24/2022   Abnormal EKG 03/12/2022   Varicose veins of leg with swelling 03/18/2021   Left shoulder pain 07/10/2020   Heartburn 07/07/2019   Fracture, foot, left, sequela 02/09/2018   Dyspepsia 04/18/2017   Osteoarthritis of both hips 04/18/2017   History of colonic polyps 11/13/2016   Pseudophakia 11/12/2016   Urinary incontinence in female 07/29/2016   Environmental allergies 07/14/2016   Annual visit for general adult medical examination with abnormal findings 02/21/2016   Chronic  radicular pain of lower back 03/13/2014   Reduced vision 03/13/2014   Essential hypertension 03/08/2014   Obesity (BMI 30.0-34.9) 02/19/2010   Vitamin D deficiency 07/27/2009   Hyperlipidemia LDL goal <100 02/19/2008   Disorder of bladder 02/19/2008   Past Medical History:  Diagnosis Date   Essential hypertension 2015    Female bladder prolapse    History of cystitis    History of skin cancer    Hyperlipidemia    Obesity    Family History  Problem Relation Age of Onset   Stroke Mother    Hypertension Mother    Heart disease Father    Cancer Maternal Grandmother    Anesthesia problems Neg Hx    Hypotension Neg Hx    Malignant hyperthermia Neg Hx    Pseudochol deficiency Neg Hx    Past Surgical History:  Procedure Laterality Date   CATARACT EXTRACTION  2012   bilateral    COLONOSCOPY  11/06/2011   Procedure: COLONOSCOPY;  Surgeon: Malissa Hippo, MD;  [facility];  Service: Endoscopy;  Laterality: N/A;  7:30   COLONOSCOPY N/A 05/21/2017   Procedure: COLONOSCOPY;  Surgeon: Malissa Hippo, MD;  [facility];  Service: Endoscopy;  Laterality: N/A;  730-moved to 9/26 @10 :30am per [facility]   EYE SURGERY  2011   bilateral cataract with implants   HAMMER TOE SURGERY Left 01/2016   Left bunionectomy     Squamous cell ca rt ant chest 1989     YAG LASER APPLICATION Left 10/24/2015   Procedure: YAG LASER APPLICATION;  Surgeon: Jethro Bolus, MD;  Location[facility];  Service: Ophthalmology;  Laterality: Left;   YAG LASER APPLICATION Right 11/07/2015   Procedure: YAG LASER APPLICATION;  Surgeon: Jethro Bolus, MD;  Location: [facility];  Service: Ophthalmology;  Laterality: Right;   Social History   Occupational History   Not on file  Tobacco Use   Smoking status: Never   Smokeless tobacco: Never  Vaping Use   Vaping status: Never Used  Substance and Sexual Activity   Alcohol use: Yes    Comment: glass of red wine most nights or scotch and water   Drug use: No   Sexual activity: Not Currently    Birth control/protection: Post-menopausal    Alivia Cimino Fara Boros) Denese Killings, M.D. [facility] 10:35 AM

## 2023-08-15 ENCOUNTER — Telehealth: Payer: Self-pay | Admitting: Orthopedic Surgery

## 2023-08-15 NOTE — Telephone Encounter (Signed)
Patient called advised she has only had 1 (PT) visit so far. Patient asked if she still need to keep the appointment scheduled 08/22/2023? The number to contact patient is [phone]

## 2023-08-22 ENCOUNTER — Ambulatory Visit: Payer: PPO | Admitting: Orthopedic Surgery

## 2023-08-22 ENCOUNTER — Telehealth: Payer: Self-pay | Admitting: Orthopedic Surgery

## 2023-08-22 NOTE — Telephone Encounter (Signed)
Patient came in and said that she was told she needed more PT before seeing Ans. She needs the referral. [phone]

## 2023-08-25 ENCOUNTER — Telehealth: Payer: Self-pay | Admitting: Rehabilitative and Restorative Service Providers"

## 2023-08-25 NOTE — Telephone Encounter (Signed)
The patient requested me to call her which I did at approximately 6 PM on 08/25/2023, but I got no answer and left a message that she could call back the next business day if she needs to.  She can also see me at her next appointment in approximately 1 week.

## 2023-09-02 NOTE — Therapy (Signed)
 OUTPATIENT OCCUPATIONAL THERAPY TREATMENT NOTE  Patient Name: Yvonne Brewer MRN: [id number] DOB:1946-06-20, 78 y.o., female Today's Date: 09/03/2023  PCP: Antonetta Quant, MD REFERRING PROVIDER: Arlinda Buster, MD   END OF SESSION:  OT End of Session - 09/03/23 1259     Visit Number 2    Number of Visits 12    Date for OT Re-Evaluation 09/26/23    Authorization Type [employer]    OT Start Time 1300    OT Stop Time 1351    OT Time Calculation (min) 51 min    Equipment Utilized During Treatment yellow putty    Activity Tolerance Patient tolerated treatment well;No increased pain;Patient limited by fatigue;Patient limited by pain    Behavior During Therapy [facility] for tasks assessed/performed              Past Medical History:  Diagnosis Date   Essential hypertension 2015   Female bladder prolapse    History of cystitis    History of skin cancer    Hyperlipidemia    Obesity    Past Surgical History:  Procedure Laterality Date   CATARACT EXTRACTION  2012   bilateral    COLONOSCOPY  11/06/2011   Procedure: COLONOSCOPY;  Surgeon: Claudis RAYMOND Rivet, MD;  Location: [facility];  Service: Endoscopy;  Laterality: N/A;  7:30   COLONOSCOPY N/A 05/21/2017   Procedure: COLONOSCOPY;  Surgeon: Rivet Claudis RAYMOND, MD;  Location: [facility];  Service: Endoscopy;  Laterality: N/A;  730-moved to 9/26 @10 :30am per Jenkins   EYE SURGERY  2011   bilateral cataract with implants   HAMMER TOE SURGERY Left 01/2016   Left bunionectomy     Squamous cell ca rt ant chest 1989     YAG LASER APPLICATION Left 10/24/2015   Procedure: YAG LASER APPLICATION;  Surgeon: Oneil Platts, MD;  Location: [facility];  Service: Ophthalmology;  Laterality: Left;   YAG LASER APPLICATION Right 11/07/2015   Procedure: YAG LASER APPLICATION;  Surgeon: Oneil Platts, MD;  Location: [facility];  Service: Ophthalmology;  Laterality: Right;   Patient Active Problem List   Diagnosis Date Noted   Tubular adenoma of  colon 07/01/2023   Sore throat 03/21/2023   Viral URI with cough 07/10/2022   IGT (impaired glucose tolerance) 04/24/2022   Abnormal EKG 03/12/2022   Varicose veins of leg with swelling 03/18/2021   Left shoulder pain 07/10/2020   Heartburn 07/07/2019   Fracture, foot, left, sequela 02/09/2018   Dyspepsia 04/18/2017   Osteoarthritis of both hips 04/18/2017   History of colonic polyps 11/13/2016   Pseudophakia 11/12/2016   Urinary incontinence in female 07/29/2016   Environmental allergies 07/14/2016   Annual visit for general adult medical examination with abnormal findings 02/21/2016   Chronic radicular pain of lower back 03/13/2014   Reduced vision 03/13/2014   Essential hypertension 03/08/2014   Obesity (BMI 30.0-34.9) 02/19/2010   Vitamin D  deficiency 07/27/2009   Hyperlipidemia LDL goal <100 02/19/2008   Disorder of bladder 02/19/2008    ONSET DATE: Approximately 07/02/2023  REFERRING DIAG: M25.532 (ICD-10-CM) - Pain in left wrist   THERAPY DIAG:  Localized edema  Muscle weakness (generalized)  Pain in left wrist  Stiffness of left wrist, not elsewhere classified  Rationale for Evaluation and Treatment: Rehabilitation  PERTINENT HISTORY: Per MD note: left wrist distal radius fracture being managed nonoperatively with casting.  Injury approximate 6 weeks prior.   She states falling on a cruise, breaking her Lt non-dom arm.  This was  about 6 weeks ago.  She met with our hand surgeon and orthopedic specialist today who did not recommend any surgery but rather conservative treatment.  She is not having significant pain when immobilized at rest.  Having no significant numbness or paresthesia.  She does admit that she is going on a vacation soon for the holidays, and she may not be able to return for 2 weeks.  OT urges her to use caution and keep her arm immobilized and protected during this trip  PRECAUTIONS: None; RED FLAGS: None   WEIGHT BEARING RESTRICTIONS: Yes  nonweightbearing left arm   SUBJECTIVE:   SUBJECTIVE STATEMENT: She is 9 weeks post Lt DRF now. She states she is back from vaca in mild to moderate pain, has been wearing her orthosis and not removing the sleeve very often.      PAIN:  Are you having pain? Yes: NPRS scale: 4/10 Pain location: Left wrist Pain description: Aching Aggravating factors: Attempted motion or weightbearing Relieving factors: Rest  PATIENT GOALS: To improve the use of her left nondominant arm    OBJECTIVE: (All objective assessments below are from initial evaluation on: 08/13/23 unless otherwise specified.)   HAND DOMINANCE: Right  ADLs: Overall ADLs: States decreased ability to grab, hold household objects, pain and difficulty to open containers, perform FMS tasks (manipulate fasteners on clothing), mild to moderate bathing problems as well.    FUNCTIONAL OUTCOME MEASURES: 09/03/23: Quick DASH 32% impairment today  (Higher % Score  =  More Impairment)     UPPER EXTREMITY ROM     Shoulder to Wrist AROM Left eval Lt 09/03/23  Elbow flexion full   Elbow extension full   Forearm supination 31 71  Forearm pronation  85 81  Wrist flexion 18 24  Wrist extension 23 39  Wrist ulnar deviation    Wrist radial deviation    Functional dart thrower's motion (F-DTM) in ulnar flexion    F-DTM in radial extension     (Blank rows = not tested)   Hand AROM Left eval Lt 09/03/23  Full Fist Ability (or Gap to Distal Palmar Crease) 3.8cm 1.5cm gap, finger touch palm now   Thumb Opposition  (Kapandji Scale)  5/10 9/10  (Blank rows = not tested)   UPPER EXTREMITY MMT:    Eval:  NT at eval due to recent and still healing injuries. Will be tested when appropriate.   MMT Right TBD Left TBD  Shoulder flexion    Shoulder abduction    Shoulder adduction    Shoulder extension    Shoulder internal rotation    Shoulder external rotation    Middle trapezius    Lower trapezius    Elbow flexion    Elbow  extension    Forearm supination    Forearm pronation    Wrist flexion    Wrist extension    Wrist ulnar deviation    Wrist radial deviation    (Blank rows = not tested)  HAND FUNCTION: 09/03/23: Grip strength Right: 35 lbs, Left: 9 lbs   Eval: Observed weakness in affected left hand.  Details will be tested when able and safe   COORDINATION: 09/03/23: 9HPT Lt: 29 Sec (24sec is AVG)    EDEMA:   Eval:  Mildly swollen in left hand and wrist today  OBSERVATIONS:   Eval: Has some sharp pain when attempting to move her wrist or her fingers today, is fairly swollen and tender to touch but when wrist is immobilized she is  doing very well. (Lt DRF non-op)   TODAY'S TREATMENT:  09/03/23: She is with active range of motion for new measures as well as exercise review which shows excellent improvement over the past 3 weeks without being seen in therapy-she was doing her home exercise program.  Unfortunately, she was not removing her sleeve much and her skin is dry and crusty and she had not even showered her arm.  For self-care, OT reiterates that she should be taken off her sleeve when she takes off her brace and showering just being careful not to use her hand and arm when she is in the shower.  In fact she can start weaning from her orthosis at this point-leaving off for 15 minutes up to an hour as tolerated during the day.  She can perform light functional activities during this time less than 10 pounds as long as not painful.    Additionally today she performs grip training as well as fine motor skill Peg Test, for functional activities.  She has no significant pain when controlling her grip and when doing fine motor skills.  Her entire exercise program was reviewed and updated to include new stretches now as well as light gripping and pinching with therapy putty and these things are bolded below.  Each of these were carefully reviewed with her and she performs back for understanding.  She leaves  without any pain but does states she is again going on another trip and will not be back until next Friday.  OT encourages her strongly to get back into therapy next Friday as she is only been seen twice so far in 1 month.   Exercises - Wrist Flexion Stretch  - 4 x daily - 3-5 reps - 15 sec hold - Wrist Prayer Stretch  - 4 x daily - 3-5 reps - 15 sec hold - SPATULA stretch   - 2-4 x daily - 1-2 sets - 10-15 reps - Full Fist  - 2-3 x daily - 5 reps - Finger Pinch and Pull with Putty  - 2-3 x daily - 5 reps - Thumb Opposition with Putty  - 2-3 x daily - 5 reps - Bend and Pull Back Wrist SLOWLY  - 4 x daily - 5 reps - Windshield Wipers   - 4 x daily - 5 reps - Tendon Glides  - 4-6 x daily - 3-5 reps - 2-3 seconds hold   PATIENT EDUCATION: Education details: See tx section above for details  Person educated: Patient Education method: Verbal Instruction, Teach back, Handouts  Education comprehension: States and demonstrates understanding, Additional Education required    HOME EXERCISE PROGRAM: Access Code: [email or web address] URL: [email or web address] Date: 08/13/2023 Prepared by: Melvenia Ada   GOALS: Goals reviewed with patient? Yes   SHORT TERM GOALS: (STG required if POC>30 days) Target Date: 08/29/2023  Pt will obtain protective, custom orthotic. Goal status: MET   2.  Pt will demo/state understanding of initial HEP to improve pain levels and prerequisite motion. Goal status: 09/03/23: MET   LONG TERM GOALS: Target Date: 09/26/2023  Pt will improve functional ability by decreased impairment per Quick DASH assessment from TBD to 15% or better, for better quality of life. Goal status: INITIAL  2.  Pt will improve grip strength in left hand to at least 30 lbs for functional use at home and in IADLs. Goal status: INITIAL  3.  Pt will improve A/ROM in left wrist flexion/extension from 18/23 to at least 60 degrees each, to have  functional motion for tasks like reach  and grasp.  Goal status: INITIAL  4.  Pt will improve strength in left wrist flexion/extension to at least 4+/5 MMT to have increased functional ability to carry out selfcare and higher-level homecare tasks with less difficulty. Goal status: INITIAL  5.  Pt will improve coordination skills in left hand and arm, as seen by within functional limit score on box and blocks testing to have increased functional ability to carry out fine motor tasks (fasteners, etc.) and more complex, coordinated IADLs (meal prep, sports, etc.).  Goal status: INITIAL  6.  Pt will decrease pain at rest from 3/10 to 1/10 or better to have better sleep and occupational participation in daily roles. Goal status: INITIAL  ASSESSMENT:  CLINICAL IMPRESSION: 09/03/23: She is doing well and pain is under control, though she has not been able to attend therapy due to frequent traveling and she is also traveling next week.  This is somewhat of a hindrance for her recovery.  Fortunately she is tolerating stretches and gripping and pinching today at 9 weeks post fracture.  We will continue as long as she can, and as tolerated carefully through 12 weeks post fracture.   PLAN:  OT FREQUENCY: 1-2x/week  OT DURATION: 6 weeks through 09/26/2023 and up to 12 total visits as needed  PLANNED INTERVENTIONS: 97168 OT Re-evaluation, 97535 self care/ADL training, 02889 therapeutic exercise, 97530 therapeutic activity, 97112 neuromuscular re-education, 97140 manual therapy, 97035 ultrasound, 97039 fluidotherapy, 97010 moist heat, 97010 cryotherapy, 97760 Orthotics management and training, 02239 Splinting (initial encounter), [id number] Subsequent splinting/medication, Dry needling, coping strategies training, and patient/family education  CONSULTED AND AGREED WITH PLAN OF CARE: Patient  PLAN FOR NEXT SESSION:   Review new gripping and pinching and therapy putty as well as new stretches.  Use heat modalities and manual therapy to help gain  motion.   Melvenia Ada, OTR/L, CHT 09/03/2023, 1:57 PM

## 2023-09-03 ENCOUNTER — Encounter: Payer: Self-pay | Admitting: Rehabilitative and Restorative Service Providers"

## 2023-09-03 ENCOUNTER — Ambulatory Visit (INDEPENDENT_AMBULATORY_CARE_PROVIDER_SITE_OTHER): Payer: PPO | Admitting: Rehabilitative and Restorative Service Providers"

## 2023-09-03 DIAGNOSIS — M25532 Pain in left wrist: Secondary | ICD-10-CM | POA: Diagnosis not present

## 2023-09-03 DIAGNOSIS — M25632 Stiffness of left wrist, not elsewhere classified: Secondary | ICD-10-CM | POA: Diagnosis not present

## 2023-09-03 DIAGNOSIS — M6281 Muscle weakness (generalized): Secondary | ICD-10-CM

## 2023-09-03 DIAGNOSIS — R600 Localized edema: Secondary | ICD-10-CM | POA: Diagnosis not present

## 2023-09-11 NOTE — Therapy (Signed)
OUTPATIENT OCCUPATIONAL THERAPY TREATMENT NOTE  Patient Name: Yvonne Brewer MRN: [id number] DOB:1946-08-23, 78 y.o., female Today's Date: 09/12/2023  PCP: Syliva Overman, MD REFERRING PROVIDER: Samuella Cota, MD   END OF SESSION:  OT End of Session - 09/12/23 0858     Visit Number 3    Number of Visits 12    Date for OT Re-Evaluation 09/26/23    Authorization Type [employer]    OT Start Time 0858    OT Stop Time 0954    OT Time Calculation (min) 56 min    Equipment Utilized During Treatment --    Activity Tolerance Patient tolerated treatment well;No increased pain;Patient limited by fatigue;Patient limited by pain    Behavior During Therapy [facility] for tasks assessed/performed               Past Medical History:  Diagnosis Date   Essential hypertension 2015   Female bladder prolapse    History of cystitis    History of skin cancer    Hyperlipidemia    Obesity    Past Surgical History:  Procedure Laterality Date   CATARACT EXTRACTION  2012   bilateral    COLONOSCOPY  11/06/2011   Procedure: COLONOSCOPY;  Surgeon: Malissa Hippo, MD;  Location: [facility];  Service: Endoscopy;  Laterality: N/A;  7:30   COLONOSCOPY N/A 05/21/2017   Procedure: COLONOSCOPY;  Surgeon: Malissa Hippo, MD;  Location: [facility];  Service: Endoscopy;  Laterality: N/A;  730-moved to 9/26 @10 :30am per [facility]   EYE SURGERY  2011   bilateral cataract with implants   HAMMER TOE SURGERY Left 01/2016   Left bunionectomy     Squamous cell ca rt ant chest 1989     YAG LASER APPLICATION Left 10/24/2015   Procedure: YAG LASER APPLICATION;  Surgeon: Jethro Bolus, MD;  Location: [facility];  Service: Ophthalmology;  Laterality: Left;   YAG LASER APPLICATION Right 11/07/2015   Procedure: YAG LASER APPLICATION;  Surgeon: Jethro Bolus, MD;  Location: [facility];  Service: Ophthalmology;  Laterality: Right;   Patient Active Problem List   Diagnosis Date Noted   Tubular adenoma of colon  07/01/2023   Sore throat 03/21/2023   Viral URI with cough 07/10/2022   IGT (impaired glucose tolerance) 04/24/2022   Abnormal EKG 03/12/2022   Varicose veins of leg with swelling 03/18/2021   Left shoulder pain 07/10/2020   Heartburn 07/07/2019   Fracture, foot, left, sequela 02/09/2018   Dyspepsia 04/18/2017   Osteoarthritis of both hips 04/18/2017   History of colonic polyps 11/13/2016   Pseudophakia 11/12/2016   Urinary incontinence in female 07/29/2016   Environmental allergies 07/14/2016   Annual visit for general adult medical examination with abnormal findings 02/21/2016   Chronic radicular pain of lower back 03/13/2014   Reduced vision 03/13/2014   Essential hypertension 03/08/2014   Obesity (BMI 30.0-34.9) 02/19/2010   Vitamin D deficiency 07/27/2009   Hyperlipidemia LDL goal <100 02/19/2008   Disorder of bladder 02/19/2008    ONSET DATE: Approximately 07/02/2023  REFERRING DIAG: M25.532 (ICD-10-CM) - Pain in left wrist   THERAPY DIAG:  Muscle weakness (generalized)  Localized edema  Pain in left wrist  Stiffness of left wrist, not elsewhere classified  Rationale for Evaluation and Treatment: Rehabilitation  PERTINENT HISTORY: Per MD note: "left wrist distal radius fracture being managed nonoperatively with casting.  Injury approximate 6 weeks prior. "  She states falling on a cruise, breaking her Lt non-dom arm.  This was  about 6 weeks ago.  She met with our hand surgeon and orthopedic specialist today who did not recommend any surgery but rather conservative treatment.  She is not having significant pain when immobilized at rest.  Having no significant numbness or paresthesia.  She does admit that she is going on a vacation soon for the holidays, and she may not be able to return for 2 weeks.  OT urges her to use caution and keep her arm immobilized and protected during this trip  PRECAUTIONS: None; RED FLAGS: None   WEIGHT BEARING RESTRICTIONS: Yes  nonweightbearing left arm   SUBJECTIVE:   SUBJECTIVE STATEMENT: She is 10 weeks post Lt DRF now. She states that she was out of town again and did not do her exercises over a long weekend.  Fortunately she is not having pain but her hand is stiff and the dorsum of her hand is painful with wrist flexion stretches.  She also does not seem to recall her home exercises very well which is not a good sign.    PAIN:  Are you having pain? Yes: NPRS scale: 0/10, and only slight sore when exercising   PATIENT GOALS: To improve the use of her left nondominant arm    OBJECTIVE: (All objective assessments below are from initial evaluation on: 08/13/23 unless otherwise specified.)   HAND DOMINANCE: Right  ADLs: Overall ADLs: States decreased ability to grab, hold household objects, pain and difficulty to open containers, perform FMS tasks (manipulate fasteners on clothing), mild to moderate bathing problems as well.    FUNCTIONAL OUTCOME MEASURES: 09/03/23: Quick DASH 32% impairment today  (Higher % Score  =  More Impairment)     UPPER EXTREMITY ROM     Shoulder to Wrist AROM Left eval Lt 09/03/23 Lt  09/12/23  Elbow flexion full    Elbow extension full    Forearm supination 31 71   Forearm pronation  85 81   Wrist flexion 18 24 33  (79 Rt)   Wrist extension 23 39 44  (55 Rt)  Wrist ulnar deviation     Wrist radial deviation     Functional dart thrower's motion (F-DTM) in ulnar flexion     F-DTM in radial extension      (Blank rows = not tested)   Hand AROM Left eval Lt 09/03/23 Lt 09/12/23  Full Fist Ability (or Gap to Distal Palmar Crease) 3.8cm Approx 1.5cm gap, finger touch palm now  1.8cm gap from tip of MF to [facility]  (looks tighter than last)  Thumb Opposition  (Kapandji Scale)  5/10 9/10 9/10  (Blank rows = not tested)   UPPER EXTREMITY MMT:     MMT Left 09/12/23  Elbow flexion   Elbow extension   Forearm supination   Forearm pronation   Wrist flexion   Wrist extension    Wrist ulnar deviation   Wrist radial deviation   (Blank rows = not tested)  HAND FUNCTION: 09/12/23: Lt grip: 13.5#   09/03/23: Grip strength Right: 35 lbs, Left: 9 lbs   Eval: Observed weakness in affected left hand.  Details will be tested when able and safe   COORDINATION: 09/03/23: 9HPT Lt: 29 sec (24sec is AVG)    EDEMA:   Eval:  Mildly swollen in left hand and wrist today  OBSERVATIONS:   09/12/23: Her arm sleeve is full of dead skin cells in her braces very dusty.  Additionally during manual therapy a lot of dead skin comes off of her  arm which leads OT but to believe she may not be removing her brace or her arm sleeve and she may not be washing her arm much or doing much exercises.  Eval: Has some sharp pain when attempting to move her wrist or her fingers today, is fairly swollen and tender to touch but when wrist is immobilized she is doing very well. (Lt DRF non-op)   TODAY'S TREATMENT:  09/12/23: She performs active range of motion for review of exercise as well as new measures-she also does repeat grip training and we review her home exercise program.  She seems to be doing well at first, everything seems to be improving and she states no pain.  However when OT performs manual therapy stretches with her she states wrist flexion is hurting the dorsum of her hand.  OT feels that this is tightness through her fingers so he assigns new finger stretches for her as bolded below.  Originally the intent was to add wrist strengthening today, however with her painful dorsum of her hand OT makes this more optional but still educates her on this.  She should not do it if it is painful.   Additionally we talk about starting to wean from her orthosis and she does not need to wear it now and that she is in pain, lifting more than 10 pounds, or outside of the home or in a crowded environment.  Otherwise she can leave this off for light activities around the house.  She states understanding.    Additionally, OT does manual therapy IASTM and myofascial release to the dorsum of the arm with concurrent wrist flexion stretches to try to improve wrist flexion today and also relieve pain in the back of her hand.  Unfortunately this does not improve her passive range of motion much but she does state less soreness through her hand now.  She was encouraged to do her home exercises at least 4 times every day as she does state being noncompliant with it, taking several trips.  She has missed several weeks of therapy and has only been seen 3 times in 10 weeks which is possibly why she is still a bit sore and stiff.   Exercises - Wrist Flexion Stretch  - 4 x daily - 3-5 reps - 15 sec hold - Wrist Prayer Stretch  - 4 x daily - 3-5 reps - 15 sec hold - SPATULA stretch   - 2-4 x daily - 1-2 sets - 10-15 reps - Full Fist  - 2-3 x daily - 5 reps - Finger Pinch and Pull with Putty  - 2-3 x daily - 5 reps - Thumb Opposition with Putty  - 2-3 x daily - 5 reps - BACK KNUCKLE STRETCHES   - 4 x daily - 3-5 reps - 15 sec hold - HOOK Stretch  - 4 x daily - 3-5 reps - 15-20 sec hold - Seated Finger Composite Flexion Stretch  - 4 x daily - 3-5 reps - 15 hold - Tendon Glides  - 4-6 x daily - 3-5 reps - 2-3 seconds hold Option if tolerated:  - Seated Isometric Wrist Extension  - 4-6 x daily - 1 sets - 10-15 reps - Seated Isometric Wrist Ulnar Deviation with Manual Resistance  - 2-3 x daily - 4-5 x weekly - 5 reps - 5-10 hold - Seated Isometric Wrist Radial Deviation with Manual Resistance  - 4-6 x daily - 1 sets - 10-15 reps - Seated Isometric Wrist Flexion Supinated  with Manual Resistance  - 4-6 x daily - 1 sets - 10-15 reps   PATIENT EDUCATION: Education details: See tx section above for details  Person educated: Patient Education method: Verbal Instruction, Teach back, Handouts  Education comprehension: States and demonstrates understanding, Additional Education required    HOME EXERCISE  PROGRAM: Access Code: [email or web address] URL: [email or web address] Date: 08/13/2023 Prepared by: [name]   GOALS: Goals reviewed with patient? Yes   SHORT TERM GOALS: (STG required if POC>30 days) Target Date: 08/29/2023  Pt will obtain protective, custom orthotic. Goal status: MET   2.  Pt will demo/state understanding of initial HEP to improve pain levels and prerequisite motion. Goal status: 09/03/23: MET   LONG TERM GOALS: Target Date: 09/26/2023  Pt will improve functional ability by decreased impairment per Quick DASH assessment from TBD to 15% or better, for better quality of life. Goal status: INITIAL  2.  Pt will improve grip strength in left hand to at least 30 lbs for functional use at home and in IADLs. Goal status: INITIAL  3.  Pt will improve A/ROM in left wrist flexion/extension from 18/23 to at least 60 degrees each, to have functional motion for tasks like reach and grasp.  Goal status: INITIAL  4.  Pt will improve strength in left wrist flexion/extension to at least 4+/5 MMT to have increased functional ability to carry out selfcare and higher-level homecare tasks with less difficulty. Goal status: INITIAL  5.  Pt will improve coordination skills in left hand and arm, as seen by within functional limit score on box and blocks testing to have increased functional ability to carry out fine motor tasks (fasteners, etc.) and more complex, coordinated IADLs (meal prep, sports, etc.).  Goal status: INITIAL  6.  Pt will decrease pain at rest from 3/10 to 1/10 or better to have better sleep and occupational participation in daily roles. Goal status: INITIAL     ASSESSMENT:  CLINICAL IMPRESSION: 09/12/23: She is only been seen 3 times in 10 weeks since her fractured arm due to going on several vacations.  She has also been noncompliant with doing her home exercise program while she has been on vacations.  This could be why she is still stiff and sore  somewhat and does not know her exercises very well.  OT encourages her that she has to get in here at least once a week and be doing her exercises every day or she will not have an optimal outcome.  Fortunately she is not having much pain at rest and her motion is improving.   PLAN:  OT FREQUENCY: 1-2x/week  OT DURATION: 6 weeks through 09/26/2023 and up to 12 total visits as needed  PLANNED INTERVENTIONS: 97168 OT Re-evaluation, 97535 self care/ADL training, 54098 therapeutic exercise, 97530 therapeutic activity, 97112 neuromuscular re-education, 97140 manual therapy, 97035 ultrasound, 97039 fluidotherapy, 97010 moist heat, 97010 cryotherapy, 97760 Orthotics management and training, 11914 Splinting (initial encounter), [id number] Subsequent splinting/medication, Dry needling, coping strategies training, and patient/family education  CONSULTED AND AGREED WITH PLAN OF CARE: Patient  PLAN FOR NEXT SESSION:   Review all exercises and ensure stretches are nonpainful.  Continue to progress light isometric strengthening as tolerated and work on more functional activities.  Ensure that she is doing her exercises every day   [name], OTR/L, CHT 09/12/2023, 10:10 AM

## 2023-09-12 ENCOUNTER — Ambulatory Visit (INDEPENDENT_AMBULATORY_CARE_PROVIDER_SITE_OTHER): Payer: PPO | Admitting: Rehabilitative and Restorative Service Providers"

## 2023-09-12 ENCOUNTER — Encounter: Payer: Self-pay | Admitting: Rehabilitative and Restorative Service Providers"

## 2023-09-12 DIAGNOSIS — M25532 Pain in left wrist: Secondary | ICD-10-CM

## 2023-09-12 DIAGNOSIS — R600 Localized edema: Secondary | ICD-10-CM

## 2023-09-12 DIAGNOSIS — M6281 Muscle weakness (generalized): Secondary | ICD-10-CM

## 2023-09-12 DIAGNOSIS — M25632 Stiffness of left wrist, not elsewhere classified: Secondary | ICD-10-CM | POA: Diagnosis not present

## 2023-09-15 ENCOUNTER — Other Ambulatory Visit (INDEPENDENT_AMBULATORY_CARE_PROVIDER_SITE_OTHER): Payer: Self-pay

## 2023-09-15 ENCOUNTER — Ambulatory Visit (INDEPENDENT_AMBULATORY_CARE_PROVIDER_SITE_OTHER): Payer: PPO | Admitting: Orthopedic Surgery

## 2023-09-15 DIAGNOSIS — M25532 Pain in left wrist: Secondary | ICD-10-CM | POA: Diagnosis not present

## 2023-09-15 DIAGNOSIS — S52502A Unspecified fracture of the lower end of left radius, initial encounter for closed fracture: Secondary | ICD-10-CM | POA: Diagnosis not present

## 2023-09-15 NOTE — Therapy (Signed)
OUTPATIENT OCCUPATIONAL THERAPY TREATMENT NOTE  Patient Name: Yvonne Brewer MRN: [id number] DOB:[birth year], 78 y.o., female Today's Date: 09/17/2023  PCP: Syliva Overman, MD REFERRING PROVIDER: Samuella Cota, MD   END OF SESSION:  OT End of Session - 09/17/23 0849     Visit Number 4    Number of Visits 12    Date for OT Re-Evaluation 09/26/23    Authorization Type [employer]    OT Start Time 0849    OT Stop Time 0930    OT Time Calculation (min) 41 min    Equipment Utilized During Treatment compressive strap    Activity Tolerance Patient tolerated treatment well;No increased pain;Patient limited by fatigue;Patient limited by pain    Behavior During Therapy [facility] for tasks assessed/performed             Past Medical History:  Diagnosis Date   Essential hypertension 2015   Female bladder prolapse    History of cystitis    History of skin cancer    Hyperlipidemia    Obesity    Past Surgical History:  Procedure Laterality Date   CATARACT EXTRACTION  2012   bilateral    COLONOSCOPY  11/06/2011   Procedure: COLONOSCOPY;  Surgeon: Malissa Hippo, MD;  [facility];  Service: Endoscopy;  Laterality: N/A;  7:30   COLONOSCOPY N/A 05/21/2017   Procedure: COLONOSCOPY;  Surgeon: Malissa Hippo, MD;  [facility];  Service: Endoscopy;  Laterality: N/A;  730-moved to 9/26 @10 :30am per [facility]   EYE SURGERY  2011   bilateral cataract with implants   HAMMER TOE SURGERY Left 01/2016   Left bunionectomy     Squamous cell ca rt ant chest 1989     YAG LASER APPLICATION Left 10/24/2015   Procedure: YAG LASER APPLICATION;  Surgeon: Jethro Bolus, MD;  Location: [facility];  Service: Ophthalmology;  Laterality: Left;   YAG LASER APPLICATION Right 11/07/2015   Procedure: YAG LASER APPLICATION;  Surgeon: Jethro Bolus, MD;  Location: [facility];  Service: Ophthalmology;  Laterality: Right;   Patient Active Problem List   Diagnosis Date Noted   Tubular adenoma  of colon 07/01/2023   Sore throat 03/21/2023   Viral URI with cough 07/10/2022   IGT (impaired glucose tolerance) 04/24/2022   Abnormal EKG 03/12/2022   Varicose veins of leg with swelling 03/18/2021   Left shoulder pain 07/10/2020   Heartburn 07/07/2019   Fracture, foot, left, sequela 02/09/2018   Dyspepsia 04/18/2017   Osteoarthritis of both hips 04/18/2017   History of colonic polyps 11/13/2016   Pseudophakia 11/12/2016   Urinary incontinence in female 07/29/2016   Environmental allergies 07/14/2016   Annual visit for general adult medical examination with abnormal findings 02/21/2016   Chronic radicular pain of lower back 03/13/2014   Reduced vision 03/13/2014   Essential hypertension 03/08/2014   Obesity (BMI 30.0-34.9) 02/19/2010   Vitamin D deficiency 07/27/2009   Hyperlipidemia LDL goal <100 02/19/2008   Disorder of bladder 02/19/2008    ONSET DATE: Approximately 07/02/2023  REFERRING DIAG: M25.532 (ICD-10-CM) - Pain in left wrist   THERAPY DIAG:  Muscle weakness (generalized)  Pain in left wrist  Localized edema  Stiffness of left wrist, not elsewhere classified  Rationale for Evaluation and Treatment: Rehabilitation  PERTINENT HISTORY: Per MD note: "left wrist distal radius fracture being managed nonoperatively with casting.  Injury approximate 6 weeks prior. "  She states falling on a cruise, breaking her Lt non-dom arm.  This was about  6 weeks ago.  She met with our hand surgeon and orthopedic specialist today who did not recommend any surgery but rather conservative treatment.  She is not having significant pain when immobilized at rest.  Having no significant numbness or paresthesia.  She does admit that she is going on a vacation soon for the holidays, and she may not be able to return for 2 weeks.  OT urges her to use caution and keep her arm immobilized and protected during this trip  PRECAUTIONS: None; RED FLAGS: None   WEIGHT BEARING RESTRICTIONS: Yes  nonweightbearing left arm   SUBJECTIVE:   SUBJECTIVE STATEMENT: She is 11 weeks post Lt DRF now. She states having some questions about her home exercise program and that she tried to do it most days.   PAIN:  Are you having pain? Yes: NPRS scale: 1-2/10, and only slight sore "all day"   PATIENT GOALS: To improve the use of her left nondominant arm    OBJECTIVE: (All objective assessments below are from initial evaluation on: 08/13/23 unless otherwise specified.)   HAND DOMINANCE: Right  ADLs: Overall ADLs: States decreased ability to grab, hold household objects, pain and difficulty to open containers, perform FMS tasks (manipulate fasteners on clothing), mild to moderate bathing problems as well.    FUNCTIONAL OUTCOME MEASURES: 09/03/23: Quick DASH 32% impairment today  (Higher % Score  =  More Impairment)     UPPER EXTREMITY ROM     Shoulder to Wrist AROM Left eval Lt 09/03/23 Lt  09/12/23 Lt 09/17/23  Elbow flexion full     Elbow extension full     Forearm supination 31 71    Forearm pronation  85 81    Wrist flexion 18 24 33  (79 Rt)  34  Wrist extension 23 39 44  (55 Rt) 35  Wrist ulnar deviation      Wrist radial deviation      Functional dart thrower's motion (F-DTM) in ulnar flexion      F-DTM in radial extension       (Blank rows = not tested)   Hand AROM Left eval Lt 09/03/23 Lt 09/12/23 Lt 09/17/23  Full Fist Ability (or Gap to Distal Palmar Crease) 3.8cm Approx 1.5cm gap, finger touch palm now  1.8cm gap from tip of MF to [facility]  (looks tighter than last) 1cm gap, much better tighter fist now  Thumb Opposition  (Kapandji Scale)  5/10 9/10 9/10 9/10  (Blank rows = not tested)   UPPER EXTREMITY MMT:     MMT Left 09/17/23  Elbow flexion   Elbow extension   Forearm supination   Forearm pronation   Wrist flexion 4+/5  Wrist extension 4-/5 tender  Wrist ulnar deviation   Wrist radial deviation   (Blank rows = not tested)  HAND FUNCTION: 09/17/23: Lt  grip: 15.6#  09/12/23: Lt grip: 13.5#   09/03/23: Grip strength Right: 35 lbs, Left: 9 lbs   Eval: Observed weakness in affected left hand.  Details will be tested when able and safe   COORDINATION: 09/03/23: 9HPT Lt: 29 sec (24sec is AVG)   EDEMA:   Eval:  Mildly swollen in left hand and wrist today  OBSERVATIONS:   09/12/23: Her arm sleeve is full of dead skin cells in her braces very dusty.  Additionally during manual therapy a lot of dead skin comes off of her arm which leads OT but to believe she may not be removing her brace or her arm sleeve  and she may not be washing her arm much or doing much exercises.  Eval: Has some sharp pain when attempting to move her wrist or her fingers today, is fairly swollen and tender to touch but when wrist is immobilized she is doing very well. (Lt DRF non-op)   TODAY'S TREATMENT:  09/17/23: She starts with active range of motion for exercise as well as new measures which shows that her wrist is unfortunately a bit stiffer now.  After starting finger stretches last time, her fingers are looser which is positive.  OT again reviews her home exercise program and especially wrist stretches, and again asks several questions about the same HEP, seeming to have some memory issues.  OT carefully reviews full HEP and emphasizes the importance of frequent stretches as a priority, as she is a bit tighter today and wrist extension still a bit painful. Isometric strength reviewed and she's doing ok with that after review and putty activities also reviewed and she is making grip strength gains.   Next, OT helps with with her stretches manually to ensure compliance and good technique.  Additionally, OT provides her with a compressive wrist strap to be worn during functional activities as she states some soreness and difficulties with them.  She states the wrist strap is comfortable and provide some support.  Exercises Reviewed today:  - Wrist Flexion Stretch  - 4 x daily  - 3-5 reps - 15 sec hold - Wrist Prayer Stretch  - 4 x daily - 3-5 reps - 15 sec hold - SPATULA stretch   - 2-4 x daily - 1-2 sets - 10-15 reps - Full Fist  - 2-3 x daily - 5 reps - Finger Pinch and Pull with Putty  - 2-3 x daily - 5 reps - Thumb Opposition with Putty  - 2-3 x daily - 5 reps - BACK KNUCKLE STRETCHES   - 4 x daily - 3-5 reps - 15 sec hold - HOOK Stretch  - 4 x daily - 3-5 reps - 15-20 sec hold - Seated Finger Composite Flexion Stretch  - 4 x daily - 3-5 reps - 15 hold - Tendon Glides  - 4-6 x daily - 3-5 reps - 2-3 seconds hold Option if tolerated:  - Seated Isometric Wrist Extension  - 4-6 x daily - 1 sets - 10-15 reps - Seated Isometric Wrist Ulnar Deviation with Manual Resistance  - 2-3 x daily - 4-5 x weekly - 5 reps - 5-10 hold - Seated Isometric Wrist Radial Deviation with Manual Resistance  - 4-6 x daily - 1 sets - 10-15 reps - Seated Isometric Wrist Flexion Supinated with Manual Resistance  - 4-6 x daily - 1 sets - 10-15 reps   PATIENT EDUCATION: Education details: See tx section above for details  Person educated: Patient Education method: Verbal Instruction, Teach back, Handouts  Education comprehension: States and demonstrates understanding, Additional Education required    HOME EXERCISE PROGRAM: Access Code: [email or web address] URL: [email or web address] Date: 08/13/2023 Prepared by: [name]   GOALS: Goals reviewed with patient? Yes   SHORT TERM GOALS: (STG required if POC>30 days) Target Date: 08/29/2023  Pt will obtain protective, custom orthotic. Goal status: MET   2.  Pt will demo/state understanding of initial HEP to improve pain levels and prerequisite motion. Goal status: 09/03/23: MET   LONG TERM GOALS: Target Date: 09/26/2023  Pt will improve functional ability by decreased impairment per Quick DASH assessment from TBD to 15% or better, for better quality  of life. Goal status: INITIAL  2.  Pt will improve grip strength in  left hand to at least 30 lbs for functional use at home and in IADLs. Goal status: INITIAL  3.  Pt will improve A/ROM in left wrist flexion/extension from 18/23 to at least 60 degrees each, to have functional motion for tasks like reach and grasp.  Goal status: INITIAL  4.  Pt will improve strength in left wrist flexion/extension to at least 4+/5 MMT to have increased functional ability to carry out selfcare and higher-level homecare tasks with less difficulty. Goal status: INITIAL  5.  Pt will improve coordination skills in left hand and arm, as seen by within functional limit score on box and blocks testing to have increased functional ability to carry out fine motor tasks (fasteners, etc.) and more complex, coordinated IADLs (meal prep, sports, etc.).  Goal status: INITIAL  6.  Pt will decrease pain at rest from 3/10 to 1/10 or better to have better sleep and occupational participation in daily roles. Goal status: INITIAL     ASSESSMENT:  CLINICAL IMPRESSION: 09/17/23: She is nearing the end of her requested plan of care and still is very stiff and mildly painful in the wrist.  Her strength and functional use of her arm is fortunately improving.  She was only seen 4 times in about 11 weeks due to several vacations and she is also shown at least mild memory issues recalling exercise program.  We need a progress note in the next session to determine the need for more therapy but it seems like she may likely need additional therapy at this standpoint.   PLAN:  OT FREQUENCY: 1-2x/week  OT DURATION: 6 weeks through 09/26/2023 and up to 12 total visits as needed  PLANNED INTERVENTIONS: 97168 OT Re-evaluation, 97535 self care/ADL training, 82956 therapeutic exercise, 97530 therapeutic activity, 97112 neuromuscular re-education, 97140 manual therapy, 97035 ultrasound, 97039 fluidotherapy, 97010 moist heat, 97010 cryotherapy, 97760 Orthotics management and training, 21308 Splinting (initial  encounter), [id number] Subsequent splinting/medication, Dry needling, coping strategies training, and patient/family education  CONSULTED AND AGREED WITH PLAN OF CARE: Patient  PLAN FOR NEXT SESSION:   Perform a progress note, check any pain and problems with functional abilities.  Continue to review the necessity of stretching every day   [name], OTR/L, CHT 09/17/2023, 4:58 PM

## 2023-09-15 NOTE — Progress Notes (Signed)
Yvonne Brewer - 78 y.o. female MRN [id number]  Date of birth: 10/04/1945  Office Visit Note: Visit Date: 09/15/2023 PCP: Kerri Perches, MD Referred by: Kerri Perches, MD  Subjective: No chief complaint on file.  HPI: Yvonne Brewer is a pleasant 78 y.o. female who presents today for follow-up of a left wrist distal radius fracture being managed nonoperatively with casting.  Injury approximate 10 weeks prior.  She is progressing well with occupational therapy from a range of motion standpoint.  Pertinent ROS were reviewed with the patient and found to be negative unless otherwise specified above in HPI.     Assessment & Plan: Visit Diagnoses:  1. Closed traumatic displaced fracture of distal end of left radius, initial encounter   2. Pain in left wrist     Plan: Extensive discussion was once again had the patient today regarding her left distal radius fracture.  I personally reviewed her repeat x-rays with her in detail today which do show appropriate interval healing of the intra-articular distal radius fracture.  As mentioned previously, there is intra-articular involvement as well as shortening of the distal radius, at this point there is some mild residual ulnar positivity as well.  She is encouraged to continue with range of motion exercises of the hand and wrist at this point, and progress strengthening as tolerated.  We will plan on seeing her back in approximate 65-months time for clinical and radiographic recheck.  Follow-up: No follow-ups on file.   Meds & Orders: No orders of the defined types were placed in this encounter.   Orders Placed This Encounter  Procedures   XR Wrist Complete Left     Procedures: No procedures performed      Clinical History: No specialty comments available.  She reports that she has never smoked. She has never used smokeless tobacco.  Recent Labs    10/03/22 1307 06/25/23 0917  HGBA1C 5.8* 5.7*    Objective:    Vital Signs: There were no vitals taken for this visit.  Physical Exam  Gen: Well-appearing, in no acute distress; non-toxic CV: Regular Rate. Well-perfused. Warm.  Resp: Breathing unlabored on room air; no wheezing. Psych: Fluid speech in conversation; appropriate affect; normal thought process  Ortho Exam Left wrist: - Digits well exposed, digital range of motion is intact, cast remains well-fitting - No tenderness about the proximal forearm or elbow region - Sensation intact in all distributions - Wrist range of motion flexion 55 degrees, extension 45 degrees without significant pain - Able to form composite fist - Thumb EPL/FPL fully intact  Imaging: XR Wrist Complete Left Result Date: 09/15/2023 X-rays of the left wrist, multi views were obtained today X-rays demonstrate stable appearance of the distal radius fracture with appropriate interval healing.  Lateral view demonstrates some dorsal angulation of the fracture, joint line remains well reduced in both the AP and lateral planes.  Slight residual ulnar positivity is appreciated.   Past Medical/Family/Surgical/Social History: Medications & Allergies reviewed per EMR, new medications updated. Patient Active Problem List   Diagnosis Date Noted   Tubular adenoma of colon 07/01/2023   Sore throat 03/21/2023   Viral URI with cough 07/10/2022   IGT (impaired glucose tolerance) 04/24/2022   Abnormal EKG 03/12/2022   Varicose veins of leg with swelling 03/18/2021   Left shoulder pain 07/10/2020   Heartburn 07/07/2019   Fracture, foot, left, sequela 02/09/2018   Dyspepsia 04/18/2017   Osteoarthritis of both hips 04/18/2017   History  of colonic polyps 11/13/2016   Pseudophakia 11/12/2016   Urinary incontinence in female 07/29/2016   Environmental allergies 07/14/2016   Annual visit for general adult medical examination with abnormal findings 02/21/2016   Chronic radicular pain of lower back 03/13/2014   Reduced vision  03/13/2014   Essential hypertension 03/08/2014   Obesity (BMI 30.0-34.9) 02/19/2010   Vitamin D deficiency 07/27/2009   Hyperlipidemia LDL goal <100 02/19/2008   Disorder of bladder 02/19/2008   Past Medical History:  Diagnosis Date   Essential hypertension 2015   Female bladder prolapse    History of cystitis    History of skin cancer    Hyperlipidemia    Obesity    Family History  Problem Relation Age of Onset   Stroke Mother    Hypertension Mother    Heart disease Father    Cancer Maternal Grandmother    Anesthesia problems Neg Hx    Hypotension Neg Hx    Malignant hyperthermia Neg Hx    Pseudochol deficiency Neg Hx    Past Surgical History:  Procedure Laterality Date   CATARACT EXTRACTION  2012   bilateral    COLONOSCOPY  11/06/2011   Procedure: COLONOSCOPY;  Surgeon: Malissa Hippo, MD;  [facility];  Service: Endoscopy;  Laterality: N/A;  7:30   COLONOSCOPY N/A 05/21/2017   Procedure: COLONOSCOPY;  Surgeon: Malissa Hippo, MD;  [facility];  Service: Endoscopy;  Laterality: N/A;  730-moved to 9/26 @10 :30am per [facility]   EYE SURGERY  2011   bilateral cataract with implants   HAMMER TOE SURGERY Left 01/2016   Left bunionectomy     Squamous cell ca rt ant chest 1989     YAG LASER APPLICATION Left 10/24/2015   Procedure: YAG LASER APPLICATION;  Surgeon: Jethro Bolus, MD;  Location: [facility];  Service: Ophthalmology;  Laterality: Left;   YAG LASER APPLICATION Right 11/07/2015   Procedure: YAG LASER APPLICATION;  Surgeon: Jethro Bolus, MD;  Location: [facility];  Service: Ophthalmology;  Laterality: Right;   Social History   Occupational History   Not on file  Tobacco Use   Smoking status: Never   Smokeless tobacco: Never  Vaping Use   Vaping status: Never Used  Substance and Sexual Activity   Alcohol use: Yes    Comment: glass of red wine most nights or scotch and water   Drug use: No   Sexual activity: Not Currently    Birth control/protection:  Post-menopausal    Areg Bialas Fara Boros) Denese Killings, M.D. [facility] 1:48 PM

## 2023-09-17 ENCOUNTER — Encounter: Payer: Self-pay | Admitting: Rehabilitative and Restorative Service Providers"

## 2023-09-17 ENCOUNTER — Ambulatory Visit (INDEPENDENT_AMBULATORY_CARE_PROVIDER_SITE_OTHER): Payer: PPO | Admitting: Rehabilitative and Restorative Service Providers"

## 2023-09-17 DIAGNOSIS — R600 Localized edema: Secondary | ICD-10-CM

## 2023-09-17 DIAGNOSIS — M6281 Muscle weakness (generalized): Secondary | ICD-10-CM | POA: Diagnosis not present

## 2023-09-17 DIAGNOSIS — M25532 Pain in left wrist: Secondary | ICD-10-CM

## 2023-09-17 DIAGNOSIS — M25632 Stiffness of left wrist, not elsewhere classified: Secondary | ICD-10-CM

## 2023-09-22 NOTE — Therapy (Signed)
OUTPATIENT OCCUPATIONAL THERAPY TREATMENT & DISCHARGE NOTE  Patient Name: Yvonne Brewer MRN: [id number] DOB:1946/02/27, 78 y.o., female Today's Date: 09/25/2023  PCP: Yvonne Overman, MD REFERRING PROVIDER: Samuella Cota, MD           OCCUPATIONAL THERAPY DISCHARGE SUMMARY  Visits from Start of Care: 5  Current functional level related to goals / functional outcomes: 09/25/23: She has now met 3 out of 6 long-term goals, has nearly met her strength goal, but remains a bit stiff with weakness of grip.  She has admitted to not following her home exercise program consistently since the start of care, which could be a reason for this-though she is stating little to no functional problems now.  We decided to discharge today as her stiffness and swelling should improve over time with continued HEP compliance and bone remodeling process.  She was given instructions to be cautious for the next 1 to 2 months with any weight higher than 10 pounds, but otherwise she should continue to progress and improve.  Patient is happy with her current status, and states understanding recommendations in her home exercise program.  Education / Equipment: Pt has all needed materials and education. Pt understands how to continue on with self-management. See tx notes for more details.   Patient agrees to discharge due to max benefits received from outpatient occupational therapy / hand therapy at this time.   Yvonne Brewer, OTR/L, CHT 09/25/23        END OF SESSION:  OT End of Session - 09/25/23 0931     Visit Number 5    Number of Visits 12    Date for OT Re-Evaluation 09/26/23    Authorization Type [employer]    OT Start Time 0931    OT Stop Time 1010    OT Time Calculation (min) 39 min    Activity Tolerance Patient tolerated treatment well;No increased pain    Behavior During Therapy [facility] for tasks assessed/performed              Past Medical History:  Diagnosis  Date   Essential hypertension 2015   Female bladder prolapse    History of cystitis    History of skin cancer    Hyperlipidemia    Obesity    Past Surgical History:  Procedure Laterality Date   CATARACT EXTRACTION  2012   bilateral    COLONOSCOPY  11/06/2011   Procedure: COLONOSCOPY;  Surgeon: Yvonne Hippo, MD;  Location: [facility];  Service: Endoscopy;  Laterality: N/A;  7:30   COLONOSCOPY N/A 05/21/2017   Procedure: COLONOSCOPY;  Surgeon: Yvonne Hippo, MD;  Location: [facility];  Service: Endoscopy;  Laterality: N/A;  730-moved to 9/26 @10 :30am per [facility]   EYE SURGERY  2011   bilateral cataract with implants   HAMMER TOE SURGERY Left 01/2016   Left bunionectomy     Squamous cell ca rt ant chest 1989     YAG LASER APPLICATION Left 10/24/2015   Procedure: YAG LASER APPLICATION;  Surgeon: Yvonne Bolus, MD;  Location: [facility] ORS;  Service: Ophthalmology;  Laterality: Left;   YAG LASER APPLICATION Right 11/07/2015   Procedure: YAG LASER APPLICATION;  Surgeon: Yvonne Bolus, MD;  Location: [facility] OR[facility];  Service: Ophthalmology;  Laterality: Right;   Patient Active Problem List   Diagnosis Date Noted   Tubular adenoma of colon 07/01/2023   Sore throat 03/21/2023   Viral URI with cough 07/10/2022   IGT (impaired glucose tolerance) 04/24/2022  Abnormal EKG 03/12/2022   Varicose veins of leg with swelling 03/18/2021   Left shoulder pain 07/10/2020   Heartburn 07/07/2019   Fracture, foot, left, sequela 02/09/2018   Dyspepsia 04/18/2017   Osteoarthritis of both hips 04/18/2017   History of colonic polyps 11/13/2016   Pseudophakia 11/12/2016   Urinary incontinence in female 07/29/2016   Environmental allergies 07/14/2016   Annual visit for general adult medical examination with abnormal findings 02/21/2016   Chronic radicular pain of lower back 03/13/2014   Reduced vision 03/13/2014   Essential hypertension 03/08/2014   Obesity (BMI 30.0-34.9) 02/19/2010   Vitamin D deficiency  07/27/2009   Hyperlipidemia LDL goal <100 02/19/2008   Disorder of bladder 02/19/2008    ONSET DATE: Approximately 07/02/2023  REFERRING DIAG: M25.532 (ICD-10-CM) - Pain in left wrist   THERAPY DIAG:  Muscle weakness (generalized)  Localized edema  Pain in left wrist  Stiffness of left wrist, not elsewhere classified  Other symptoms and signs involving the musculoskeletal system  Chronic left shoulder pain  Stiffness of left shoulder, not elsewhere classified  Rationale for Evaluation and Treatment: Rehabilitation  PERTINENT HISTORY: Per MD note: "left wrist distal radius fracture being managed nonoperatively with casting.  Injury approximate 6 weeks prior. "  She states falling on a cruise, breaking her Lt non-dom arm.  This was about 6 weeks ago.  She met with our hand surgeon and orthopedic specialist today who did not recommend any surgery but rather conservative treatment.  She is not having significant pain when immobilized at rest.  Having no significant numbness or paresthesia.  She does admit that she is going on a vacation soon for the holidays, and she may not be able to return for 2 weeks.  OT urges her to use caution and keep her arm immobilized and protected during this trip  PRECAUTIONS: None; RED FLAGS: None   WEIGHT BEARING RESTRICTIONS: Yes nonweightbearing left arm   SUBJECTIVE:   SUBJECTIVE STATEMENT: She is 12 weeks post Lt DRF now. She states having no pain but some aching in the mornings.    PAIN:  Are you having pain? None now at rest, did have some aching this morning.   PATIENT GOALS: To improve the use of her left nondominant arm    OBJECTIVE: (All objective assessments below are from initial evaluation on: 08/13/23 unless otherwise specified.)   HAND DOMINANCE: Right  ADLs: Overall ADLs: States decreased ability to grab, hold household objects, pain and difficulty to open containers, perform FMS tasks (manipulate fasteners on clothing),  mild to moderate bathing problems as well.    FUNCTIONAL OUTCOME MEASURES: 09/25/23: Quick DASH: 6.8%    09/03/23: Quick DASH 32% impairment today  (Higher % Score  =  More Impairment)     UPPER EXTREMITY ROM     Shoulder to Wrist AROM Left eval Lt 09/25/23  Elbow flexion full full  Elbow extension full full  Forearm supination 31 77  Forearm pronation  85 90  Wrist flexion 18 26  Wrist extension 23 41  (Blank rows = not tested)   Hand AROM Left eval Lt 09/25/23  Full Fist Ability (or Gap to Distal Palmar Crease) 3.8cm gap to a full fist Full fist, a bit loose still (limited by residual swelling)  Thumb Opposition  (Kapandji Scale)  5/10 9/10  (Blank rows = not tested)   UPPER EXTREMITY MMT:     MMT Left 09/17/23 Lt 09/25/23  Elbow flexion    Elbow extension  Forearm supination    Forearm pronation    Wrist flexion 4+/5 5/5  Wrist extension 4-/5 tender 4/5 slightly tender  Wrist ulnar deviation    Wrist radial deviation    (Blank rows = not tested)  HAND FUNCTION: 09/25/23: 11# Lt grip (Rt was 30#)  09/03/23: Grip strength Right: 35 lbs, Left: 9 lbs   COORDINATION: 09/25/23: 9HPT Lt:  27sec   09/03/23: 9HPT Lt: 29 sec (24sec is AVG)   EDEMA:   09/25/23: swelling is still mild in Lt wrist, seems to be equal in hands (Rt hand also mildly swollen today- seems systemic thing)   OBSERVATIONS:   09/12/23: Her arm sleeve is full of dead skin cells in her braces very dusty.  Additionally during manual therapy a lot of dead skin comes off of her arm which leads OT but to believe she may not be removing her brace or her arm sleeve and she may not be washing her arm much or doing much exercises.  Eval: Has some sharp pain when attempting to move her wrist or her fingers today, is fairly swollen and tender to touch but when wrist is immobilized she is doing very well. (Lt DRF non-op)   TODAY'S TREATMENT:  09/25/23: Pt performs AROM, gripping, and strength with left  hand/wrist/arm against therapist's resistance for exercise/activities as well as new measures today. OT also discusses home and functional tasks with the pt and reviews goals. Using the complied data, OT also reviews home exercises and provides final recommendations as below. Pt states understanding and tolerates upgrades well.  Though she has some weakness of grip and stiffness in the wrist still, she states being pleased with her overall progress and where things stand, not having any questions about her homework or any remaining questions or concerns for the therapist.  She agrees to discharge today for maximum progress made at this time.  Exercises Reviewed today:  - Wrist Flexion Stretch  - 4 x daily - 3-5 reps - 15 sec hold - Wrist Prayer Stretch  - 4 x daily - 3-5 reps - 15 sec hold - Full Fist  - 2-3 x daily - 5 reps - Finger Pinch and Pull with Putty  - 2-3 x daily - 5 reps - Thumb Opposition with Putty  - 2-3 x daily - 5 reps - BACK KNUCKLE STRETCHES   - 4 x daily - 3-5 reps - 15 sec hold - HOOK Stretch  - 4 x daily - 3-5 reps - 15-20 sec hold - Seated Finger Composite Flexion Stretch  - 4 x daily - 3-5 reps - 15 hold  Recommendations given today:  "Nate the OT would recommend staying on 5 mins of heat and stretches in the morning, and 1-2x again that day, if needed for 1 more month.  Use putty to strengthen hand/fingers as desired.  You are cleared to do all normal functional activities  (for 1-2 months be careful with anything more than 10#).  It would be great to develop a 3-5x week exercise routine for total mobility and strength in body (especially left arm!  12 )  You cannot really damage your arm at this point- except for, God forbid, another accident.   The bone is STRONG."   PATIENT EDUCATION: Education details: See tx section above for details  Person educated: Patient Education method: Verbal Instruction, Teach back, Handouts  Education comprehension: States and  demonstrates understanding   HOME EXERCISE PROGRAM: Access Code: [email or web address] URL: [email or web address] Date: 08/13/2023  Prepared by: Yvonne Brewer   GOALS: Goals reviewed with patient? Yes   SHORT TERM GOALS: (STG required if POC>30 days) Target Date: 08/29/2023  Pt will obtain protective, custom orthotic. Goal status: MET   2.  Pt will demo/state understanding of initial HEP to improve pain levels and prerequisite motion. Goal status: 09/03/23: MET   LONG TERM GOALS: Target Date: 09/26/2023  Pt will improve functional ability by decreased impairment per Quick DASH assessment from TBD to 15% or better, for better quality of life. Goal status: 09/25/23: MET 8% Problems  2.  Pt will improve grip strength in left hand to at least 30 lbs for functional use at home and in IADLs. Goal status: 09/25/23: Not Met  11# today (but Rt hand also down today with some bil hand swelling)   3.  Pt will improve A/ROM in left wrist flexion/extension from 18/23 to at least 60 degrees each, to have functional motion for tasks like reach and grasp.  Goal status: 09/25/23: Not Met 28* / 41 today   4.  Pt will improve strength in left wrist flexion/extension to at least 4+/5 MMT to have increased functional ability to carry out selfcare and higher-level homecare tasks with less difficulty. Goal status: 09/25/23: Partially Met- flexion 5/5, extension 4/5  5.  Pt will improve coordination skills in left hand and arm, as seen by within functional limit score on box and blocks testing to have increased functional ability to carry out fine motor tasks (fasteners, etc.) and more complex, coordinated IADLs (meal prep, sports, etc.).  Goal status: 09/25/23: MET- almost average and definitely functional   6.  Pt will decrease pain at rest from 3/10 to 1/10 or better to have better sleep and occupational participation in daily roles. Goal status: 09/25/23: MET 0/10      ASSESSMENT:  CLINICAL  IMPRESSION: 09/25/23: She has now met 3 out of 6 long-term goals, has nearly met her strength goal, but remains a bit stiff with weakness of grip.  She has admitted to not following her home exercise program consistently since the start of care, which could be a reason for this-though she is stating little to no functional problems now.  We decided to discharge today as her stiffness and swelling should improve over time with continued HEP compliance and bone remodeling process.  She was given instructions to be cautious for the next 1 to 2 months with any weight higher than 10 pounds, but otherwise she should continue to progress and improve.  Patient is happy with her current status, and states understanding recommendations in her home exercise program.   PLAN:  OT FREQUENCY: D/C   OT DURATION: D/C   PLANNED INTERVENTIONS: 97168 OT Re-evaluation, 97535 self care/ADL training, 16109 therapeutic exercise, 97530 therapeutic activity, 97112 neuromuscular re-education, 97140 manual therapy, 97035 ultrasound, 97039 fluidotherapy, 97010 moist heat, 97010 cryotherapy, 97760 Orthotics management and training, 60454 Splinting (initial encounter), [id number] Subsequent splinting/medication, Dry needling, coping strategies training, and patient/family education  CONSULTED AND AGREED WITH PLAN OF CARE: Patient  PLAN FOR NEXT SESSION:   N/A , D/C    Yvonne Brewer, OTR/L, CHT 09/25/2023, 12:08 PM

## 2023-09-24 ENCOUNTER — Encounter: Payer: PPO | Admitting: Rehabilitative and Restorative Service Providers"

## 2023-09-25 ENCOUNTER — Encounter: Payer: Self-pay | Admitting: Rehabilitative and Restorative Service Providers"

## 2023-09-25 ENCOUNTER — Ambulatory Visit: Payer: PPO | Admitting: Rehabilitative and Restorative Service Providers"

## 2023-09-25 DIAGNOSIS — M6281 Muscle weakness (generalized): Secondary | ICD-10-CM | POA: Diagnosis not present

## 2023-09-25 DIAGNOSIS — M25632 Stiffness of left wrist, not elsewhere classified: Secondary | ICD-10-CM

## 2023-09-25 DIAGNOSIS — M25512 Pain in left shoulder: Secondary | ICD-10-CM

## 2023-09-25 DIAGNOSIS — G8929 Other chronic pain: Secondary | ICD-10-CM

## 2023-09-25 DIAGNOSIS — M25612 Stiffness of left shoulder, not elsewhere classified: Secondary | ICD-10-CM

## 2023-09-25 DIAGNOSIS — R29898 Other symptoms and signs involving the musculoskeletal system: Secondary | ICD-10-CM

## 2023-09-25 DIAGNOSIS — M25532 Pain in left wrist: Secondary | ICD-10-CM | POA: Diagnosis not present

## 2023-09-25 DIAGNOSIS — R600 Localized edema: Secondary | ICD-10-CM | POA: Diagnosis not present

## 2023-10-30 ENCOUNTER — Encounter (HOSPITAL_COMMUNITY): Payer: Self-pay

## 2023-10-30 ENCOUNTER — Ambulatory Visit (HOSPITAL_COMMUNITY)
Admission: RE | Admit: 2023-10-30 | Discharge: 2023-10-30 | Disposition: A | Discharge status: Home / Self Care | Payer: PPO | Source: Ambulatory Visit | Attending: Family Medicine | Admitting: Family Medicine

## 2023-10-30 DIAGNOSIS — Z1231 Encounter for screening mammogram for malignant neoplasm of breast: Secondary | ICD-10-CM | POA: Diagnosis present

## 2023-11-04 ENCOUNTER — Ambulatory Visit: Payer: Self-pay | Admitting: Family Medicine

## 2023-12-02 ENCOUNTER — Ambulatory Visit: Payer: PPO

## 2023-12-09 ENCOUNTER — Other Ambulatory Visit: Payer: Self-pay

## 2023-12-09 ENCOUNTER — Telehealth: Payer: Self-pay | Admitting: Family Medicine

## 2023-12-09 MED ORDER — AMLODIPINE BESYLATE 5 MG PO TABS
5.0000 mg | ORAL_TABLET | Freq: Every day | ORAL | 1 refills | Status: AC
Start: 2023-12-09 — End: ?

## 2023-12-09 MED ORDER — AMLODIPINE BESYLATE 2.5 MG PO TABS: 2.5000 mg | ORAL_TABLET | Freq: Every day | ORAL | 1 refills | Status: DC

## 2023-12-09 NOTE — Telephone Encounter (Signed)
 Prescription Request  12/09/2023  LOV: 06/25/2023  What is the name of the medication or equipment? amLODipine (NORVASC) 5 MG tablet [[id number]]   amLODipine (NORVASC) 2.5 MG tablet [[id number]]    Have you contacted your pharmacy to request a refill? Yes   Which pharmacy would you like this sent to?  [facility] - [place], [place] - [address] [place] [address] Phone: [phone] Fax: [phone]    Patient notified that their request is being sent to the clinical staff for review and that they should receive a response within 2 business days.   Please advise at  walked into office

## 2023-12-10 NOTE — Telephone Encounter (Signed)
 LVM to CB. Refills were sent in yesterday.

## 2023-12-15 ENCOUNTER — Ambulatory Visit: Payer: PPO | Admitting: Orthopedic Surgery

## 2024-01-12 ENCOUNTER — Telehealth: Payer: Self-pay | Admitting: Orthopaedic Surgery

## 2024-01-12 NOTE — Telephone Encounter (Signed)
 Returned the pt's call, lvm for her to cb.

## 2024-01-14 ENCOUNTER — Ambulatory Visit (INDEPENDENT_AMBULATORY_CARE_PROVIDER_SITE_OTHER): Admitting: Orthopaedic Surgery

## 2024-01-14 ENCOUNTER — Encounter: Payer: Self-pay | Admitting: Orthopaedic Surgery

## 2024-01-14 DIAGNOSIS — M25532 Pain in left wrist: Secondary | ICD-10-CM | POA: Diagnosis not present

## 2024-01-14 NOTE — Progress Notes (Signed)
 My left wrist hurts.  She had a fracture of the left wrist while on a cruise in November, 2024.  I saw her after she got back home.  She was sent to the hand surgeon and had OT for the hand.  She has been discharged from OT and has completed care with the hand surgeon.  I have reviewed all the notes.  She had X-rays done last time in January, 2025 showing a healed fracture of the distal radius with impaction of the distal radius and shortening.  She complains of pain in the left wrist and inability to use it doing normal housework.  She does not like to take medicine orally and when she does try Advil, it helps some.  She has no new trauma, no redness, no numbness.  She has shortening of the distal radius with prominent distal ulna and more swelling of the volar ulna area.  Most of her pain is here at the distal volar ulna.  She has no redness.  Dorsiflexion is 15 and volar flexion is 10, ulnar deviation is 5 and radial is 5.  Grips are decreased on the left.  NV intact.    Encounter Diagnosis  Name Primary?   Pain in left wrist Yes   I have given her some exercises to begin using a regular hammer.  I have told her to take Aleve one twice a day after eating.  I have asked her to try Voltaren Gel or Aspercreme or Biofreeze to the affected areas as needed.  Return in six weeks.  Call if any problem.  Precautions discussed.  Electronically Signed Pleasant Brilliant, MD 5/21/20258:59 AM

## 2024-01-14 NOTE — Patient Instructions (Signed)
 Recommend getting voltaren Gel to help with pain, follow up in 6 weeks with [place].

## 2024-02-09 ENCOUNTER — Other Ambulatory Visit: Payer: Self-pay | Admitting: Family Medicine

## 2024-02-12 ENCOUNTER — Telehealth: Payer: Self-pay

## 2024-02-12 ENCOUNTER — Other Ambulatory Visit: Payer: Self-pay

## 2024-02-12 MED ORDER — AMLODIPINE BESYLATE 5 MG PO TABS: 5.0000 mg | ORAL_TABLET | Freq: Every day | ORAL | 1 refills | Status: DC

## 2024-02-12 NOTE — Telephone Encounter (Signed)
 5mg  resent

## 2024-02-12 NOTE — Telephone Encounter (Signed)
 Copied from CRM [id number]. Topic: Clinical - Prescription Issue >> Feb 12, 2024  3:01 PM Phil Braun wrote: Reason for CRM:   Ref:amLODipine  (NORVASC ) 5 MG tablet  Pt went to [facility] pharmacy to pick both rx's but they stated that they only received the 2.5. please resend the 5 mg tablet or contact the pharmacy to verify.

## 2024-02-25 ENCOUNTER — Ambulatory Visit: Admitting: Orthopaedic Surgery

## 2024-03-11 ENCOUNTER — Ambulatory Visit: Payer: Self-pay

## 2024-03-11 NOTE — Telephone Encounter (Signed)
 Patient is questioning if chronic lower leg edema is r/t amlodipine .  FYI Only or Action Required?: Action required by provider: clinical question for provider.  Patient was last seen in primary care on 06/25/2023 by Antonetta Rollene BRAVO, MD.  Called Nurse Triage reporting Leg Swelling.  Symptoms began Unable to specify other than a long time.  Interventions attempted: Nothing.  Symptoms are: unchanged.  Triage Disposition: See PCP When Office is Open (Within 3 Days)  Patient/caregiver understands and will follow disposition?: Yes  Copied from CRM [id number]. Topic: Clinical - Red Word Triage >> Mar 11, 2024 11:44 AM Rosaria BRAVO wrote: Red Word that prompted transfer to Nurse Triage: Swelling in legs because of medication possibly. >> Mar 11, 2024 11:53 AM Rosaria BRAVO wrote: Tried transferring call to triage, unsuccessful. Tried re-dialing pt no answer, busy line Reason for Disposition  [1] MILD swelling of both ankles (i.e., pedal edema) AND [2] new-onset or getting worse  Answer Assessment - Initial Assessment Questions Patient is already scheduled for an OV on 7/25. Denied higher acuity questions and reviewed ED/911 precautions including if she experiences any difficulty breathing. Pt verbalized understanding.  1. ONSET: When did the swelling start? (e.g., minutes, hours, days)     A long time, unable to specify but remembers provider mentioning it in previous visits - questions if r/t amlodipine  2. LOCATION: What part of the leg is swollen?  Are both legs swollen or just one leg?     Bilateral lower legs and feet 3. SEVERITY: How bad is the swelling? (e.g., localized; mild, moderate, severe)     Non-pitting 4. REDNESS: Is there redness or signs of infection?     No 5. PAIN: Is the swelling painful to touch? If Yes, ask: How painful is it?   (Scale 1-10; mild, moderate or severe)     No 6. FEVER: Do you have a fever? If Yes, ask: What is it, how was it  measured, and when did it start?      No 7. CAUSE: What do you think is causing the leg swelling?     Amlodipine  8. MEDICAL HISTORY: Do you have a history of blood clots (e.g., DVT), cancer, heart failure, kidney disease, or liver failure?     No 9. RECURRENT SYMPTOM: Have you had leg swelling before? If Yes, ask: When was the last time? What happened that time?     Yes, chronic 10. OTHER SYMPTOMS: Do you have any other symptoms? (e.g., chest pain, difficulty breathing)       No  Protocols used: Leg Swelling and Edema-A-AH

## 2024-03-12 ENCOUNTER — Other Ambulatory Visit: Payer: Self-pay

## 2024-03-12 DIAGNOSIS — M7989 Other specified soft tissue disorders: Secondary | ICD-10-CM

## 2024-03-12 MED ORDER — UNABLE TO FIND: 0 refills | Status: AC

## 2024-03-12 NOTE — Telephone Encounter (Signed)
 Pt informed, rx sent to [facility]

## 2024-03-19 ENCOUNTER — Ambulatory Visit (INDEPENDENT_AMBULATORY_CARE_PROVIDER_SITE_OTHER): Payer: Self-pay | Admitting: Family Medicine

## 2024-03-19 ENCOUNTER — Encounter: Payer: Self-pay | Admitting: Family Medicine

## 2024-03-19 VITALS — BP 114/73 | HR 75 | Resp 18 | Ht 62.0 in | Wt 194.0 lb

## 2024-03-19 DIAGNOSIS — E559 Vitamin D deficiency, unspecified: Secondary | ICD-10-CM | POA: Diagnosis not present

## 2024-03-19 DIAGNOSIS — S62102D Fracture of unspecified carpal bone, left wrist, subsequent encounter for fracture with routine healing: Secondary | ICD-10-CM

## 2024-03-19 DIAGNOSIS — I10 Essential (primary) hypertension: Secondary | ICD-10-CM

## 2024-03-19 DIAGNOSIS — R7302 Impaired glucose tolerance (oral): Secondary | ICD-10-CM

## 2024-03-19 DIAGNOSIS — I83893 Varicose veins of bilateral lower extremities with other complications: Secondary | ICD-10-CM

## 2024-03-19 DIAGNOSIS — Z0001 Encounter for general adult medical examination with abnormal findings: Secondary | ICD-10-CM

## 2024-03-19 DIAGNOSIS — E785 Hyperlipidemia, unspecified: Secondary | ICD-10-CM

## 2024-03-19 DIAGNOSIS — D126 Benign neoplasm of colon, unspecified: Secondary | ICD-10-CM

## 2024-03-19 NOTE — Progress Notes (Signed)
    Yvonne Brewer     MRN: [id number]      DOB: 1945-12-23  Chief Complaint  Patient presents with   Leg Swelling    Pt complains of bilateral leg and ankle swelling for a while. She believes it is her amlodipine     Annual Exam    HPI: Patient is in for annual physical exam. C/o leg swelling , as above wants to change to losartan , however , after long discussion and the fact that she is not actually edematous, has decided per my recommendations to stay on amlodipine  and use compression hose, salt reduction and leg elevation Labs will be updated in 2 weeks Right wrist fracture in 2024 Immunization is reviewed , and  likely needs to be updated will check pharmacy.   PE: BP 114/73   Pulse 75   Resp 18   Ht 5' 2 (1.575 m)   Wt 194 lb (88 kg)   SpO2 96%   BMI 35.48 kg/m   Pleasant  female, alert and oriented x 3, in no cardio-pulmonary distress. Afebrile. HEENT No facial trauma or asymetry. Sinuses non tender.  Extra occullar muscles intact.. External ears normal, . Neck: supple, no adenopathy,JVD or thyromegaly.No bruits.  Chest: Clear to ascultation bilaterally.No crackles or wheezes. Non tender to palpation    Cardiovascular system; Heart sounds normal,  S1 and  S2 ,no S3.  No murmur, or thrill. Apical beat not displaced Peripheral pulses normal.  Abdomen: Soft, non tender,.     Musculoskeletal exam: Decreased though adequate ROM of spine, hips , shoulders and knees. No deformity ,swelling or crepitus noted. No muscle wasting or atrophy.   Neurologic: Cranial nerves 2 to 12 intact. Power, tone ,sensation  normal throughout. Minor  disturbance in gait. No tremor.  Skin: Intact, no ulceration, erythema , scaling or rash noted. Pigmentation normal throughout  Psych; Normal mood and affect. Judgement and concentration normal   Assessment & Plan:  Annual visit for general adult medical examination with abnormal findings Annual exam as  documented. Counseling done  re healthy lifestyle involving commitment to 150 minutes exercise per week, heart healthy diet, and attaining healthy weight.The importance of adequate sleep also discussed.  Immunization and cancer screening needs are specifically addressed at this visit.   Varicose veins of leg with swelling Needs to wear compression hose, reduce salt intake and elevate legs more consisitently  Fracture of left wrist D/s from hand surgeon who she was referred to by local Ortho , now being followed by local Ortho with return visit proposed  Tubular adenoma of colon Colonoscopy due 04/2024, pt reports reaching out to office and being told she will be contacted, will f/u with  office as to preferred / best procedure

## 2024-03-19 NOTE — Patient Instructions (Addendum)
 F/U in 6 months, call if you need me sooner  Wellness past due please schedule at checkout  Fasting CBC lipid CMP and EGFR HbA1c TSH and vitamin D  in 2 weeks.  Continue current medication that you are taking for blood pressure as we discussed.  I recommend compression hose which you can get locally as well as at store in [place] called elastotherapy  Leg elevation and reducing sodium intake will also help to keep leg swollen.  Physical exam is not consistent with significant fluid retention at all and this is good.  Your next colonoscopy is due in September of this year and I will put in a specific referral.  Immunization that states you are behind in is your tetanus shot as well as a hepatitis B series.  I will ask my nurse to check in drugs when you get your medication Yurek shots to see if you have gotten both of these.  I will look further into your cardiovascular assessment and see if you do need the calcium  scoring test or if you are seeing your cardiologist recently.  A referral for your follow-up with the broken left wrist in November will be placed once we ascertain the name of the surgeon who you saw. It is important that you exercise regularly at least 30 minutes 5 times a week. If you develop chest pain, have severe difficulty breathing, or feel very tired, stop exercising immediately and seek medical attention   Think about what you will eat, plan ahead. Choose  clean, green, fresh or frozen over canned, processed or packaged foods which are more sugary, salty and fatty. 70 to 75% of food eaten should be vegetables and fruit. Three meals at set times with snacks allowed between meals, but they must be fruit or vegetables. Aim to eat over a 12 hour period , example 7 am to 7 pm, and STOP after  your last meal of the day. Drink water ,generally about 64 ounces per day, no other drink is as healthy. Fruit juice is best enjoyed in a healthy way, by EATING the fruit. Thanks for  choosing [facility], we consider it a privelige to serve you.

## 2024-03-20 DIAGNOSIS — S62102A Fracture of unspecified carpal bone, left wrist, initial encounter for closed fracture: Secondary | ICD-10-CM | POA: Insufficient documentation

## 2024-03-20 NOTE — Assessment & Plan Note (Signed)
 Colonoscopy due 04/2024, pt reports reaching out to office and being told she will be contacted, will f/u with  office as to preferred / best procedure

## 2024-03-20 NOTE — Assessment & Plan Note (Signed)
 D/s from hand surgeon who she was referred to by local Ortho , now being followed by local Ortho with return visit proposed

## 2024-03-20 NOTE — Assessment & Plan Note (Addendum)
 Annual exam as documented. Counseling done  re healthy lifestyle involving commitment to 150 minutes exercise per week, heart healthy diet, and attaining healthy weight.The importance of adequate sleep also discussed.  Immunization and cancer screening needs are specifically addressed at this visit.

## 2024-03-20 NOTE — Assessment & Plan Note (Signed)
 Needs to wear compression hose, reduce salt intake and elevate legs more consisitently

## 2024-04-06 ENCOUNTER — Encounter (INDEPENDENT_AMBULATORY_CARE_PROVIDER_SITE_OTHER): Payer: Self-pay | Admitting: *Deleted

## 2024-04-07 ENCOUNTER — Other Ambulatory Visit (INDEPENDENT_AMBULATORY_CARE_PROVIDER_SITE_OTHER): Payer: Self-pay | Admitting: Gastroenterology

## 2024-04-07 ENCOUNTER — Telehealth: Payer: Self-pay | Admitting: *Deleted

## 2024-04-07 MED ORDER — NA SULFATE-K SULFATE-MG SULF 17.5-3.13-1.6 GM/177ML PO SOLN: 1.0000 | ORAL | 0 refills | Status: DC

## 2024-04-07 NOTE — Telephone Encounter (Signed)
 Spoke with pt. She has been scheduled for 9/25. Aware will get a pre-op phone with arrival time. Instructions to be mailed. Rx sent to pharmacy. Fyi ann as she was sent a triage.

## 2024-04-07 NOTE — Telephone Encounter (Signed)
-----   Message from Deatrice JULIANNA Dine sent at 04/07/2024 10:28 AM EDT ----- Regarding: TCS Ok to schedule.  Room any  Thanks,  Muhammad Faizan Ahmed, MD Gastroenterology and Hepatology [facility]

## 2024-04-08 ENCOUNTER — Telehealth: Payer: Self-pay | Admitting: Family Medicine

## 2024-04-08 NOTE — Telephone Encounter (Signed)
 Copied from CRM [id number]. Topic: General - Other >> Apr 08, 2024  9:46 AM Yvonne Brewer wrote: Reason for CRM: Patient states she is wanting to stop by the office soon and would like to pick up a copy of medical records/vaccines. She is going out of town & states she will be stopping by in about 20 minutes.

## 2024-04-14 ENCOUNTER — Other Ambulatory Visit: Payer: Self-pay | Admitting: Family Medicine

## 2024-04-16 ENCOUNTER — Encounter: Payer: Self-pay | Admitting: Radiology

## 2024-04-22 ENCOUNTER — Telehealth: Payer: Self-pay | Admitting: Family Medicine

## 2024-04-22 NOTE — Telephone Encounter (Signed)
 Given to provider

## 2024-04-22 NOTE — Telephone Encounter (Signed)
 Medical certificate [facility]  Noted Copied Sleeved Original placed in provider box Copy placed in  front desk folder Call patient at [phone] when ready

## 2024-04-22 NOTE — Telephone Encounter (Signed)
 Obtained

## 2024-04-27 NOTE — Telephone Encounter (Signed)
 Patient here requesting form to be expedited says this is the last thing she needs to [facility]. Informed patient she will receive a call when papers are done

## 2024-04-27 NOTE — Telephone Encounter (Signed)
 Noted, It has only been a few days, Will fax and let pt know when completed

## 2024-04-28 ENCOUNTER — Other Ambulatory Visit: Payer: Self-pay

## 2024-04-28 DIAGNOSIS — R32 Unspecified urinary incontinence: Secondary | ICD-10-CM

## 2024-04-28 NOTE — Telephone Encounter (Signed)
 Patient advised to come have urine and labs done this week. Pt will come either tomorrow or Fri

## 2024-04-28 NOTE — Telephone Encounter (Signed)
 Received back from provider, needing pt to have urine test and blood work done before completion. Lvm to cb

## 2024-04-30 ENCOUNTER — Ambulatory Visit: Payer: Self-pay | Admitting: Family Medicine

## 2024-04-30 LAB — CMP14+EGFR
ALT: 16 IU/L (ref 0–32)
AST: 19 IU/L (ref 0–40)
Albumin: 4.3 g/dL (ref 3.8–4.8)
Alkaline Phosphatase: 77 IU/L (ref 44–121)
BUN/Creatinine Ratio: 15 (ref 12–28)
BUN: 14 mg/dL (ref 8–27)
Bilirubin Total: 0.4 mg/dL (ref 0.0–1.2)
CO2: 23 mmol/L (ref 20–29)
Calcium: 9.5 mg/dL (ref 8.7–10.3)
Chloride: 100 mmol/L (ref 96–106)
Creatinine, Ser: 0.94 mg/dL (ref 0.57–1.00)
Globulin, Total: 2.5 g/dL (ref 1.5–4.5)
Glucose: 92 mg/dL (ref 70–99)
Potassium: 4.3 mmol/L (ref 3.5–5.2)
Sodium: 138 mmol/L (ref 134–144)
Total Protein: 6.8 g/dL (ref 6.0–8.5)
eGFR: 62 mL/min/1.73 (ref >59)

## 2024-04-30 LAB — CBC WITH DIFFERENTIAL/PLATELET
Basophils Absolute: 0.1 x10E3/uL (ref 0.0–0.2)
Basos: 1 %
EOS (ABSOLUTE): 0.4 x10E3/uL (ref 0.0–0.4)
Eos: 5 %
Hematocrit: 41.6 % (ref 34.0–46.6)
Hemoglobin: 13.2 g/dL (ref 11.1–15.9)
Immature Grans (Abs): 0 x10E3/uL (ref 0.0–0.1)
Immature Granulocytes: 0 %
Lymphocytes Absolute: 2.7 x10E3/uL (ref 0.7–3.1)
Lymphs: 37 %
MCH: 30.9 pg (ref 26.6–33.0)
MCHC: 31.7 g/dL (ref 31.5–35.7)
MCV: 97 fL (ref 79–97)
Monocytes Absolute: 0.6 x10E3/uL (ref 0.1–0.9)
Monocytes: 8 %
Neutrophils Absolute: 3.6 x10E3/uL (ref 1.4–7.0)
Neutrophils: 49 %
Platelets: 349 x10E3/uL (ref 150–450)
RBC: 4.27 x10E6/uL (ref 3.77–5.28)
RDW: 12.5 % (ref 11.7–15.4)
WBC: 7.3 x10E3/uL (ref 3.4–10.8)

## 2024-04-30 LAB — TSH: TSH: 1.72 u[IU]/mL (ref 0.450–4.500)

## 2024-04-30 LAB — LIPID PANEL
Chol/HDL Ratio: 4.7 ratio — ABNORMAL HIGH (ref 0.0–4.4)
Cholesterol, Total: 214 mg/dL — ABNORMAL HIGH (ref 100–199)
HDL: 46 mg/dL (ref >39)
LDL Chol Calc (NIH): 138 mg/dL — ABNORMAL HIGH (ref 0–99)
Triglycerides: 169 mg/dL — ABNORMAL HIGH (ref 0–149)
VLDL Cholesterol Cal: 30 mg/dL (ref 5–40)

## 2024-04-30 LAB — HEMOGLOBIN A1C
Est. average glucose Bld gHb Est-mCnc: 114 mg/dL
Hgb A1c MFr Bld: 5.6 % (ref 4.8–5.6)

## 2024-04-30 LAB — VITAMIN D 25 HYDROXY (VIT D DEFICIENCY, FRACTURES): Vit D, 25-Hydroxy: 24.2 ng/mL — ABNORMAL LOW (ref 30.0–100.0)

## 2024-04-30 MED ORDER — EZETIMIBE 10 MG PO TABS: 10.0000 mg | ORAL_TABLET | Freq: Every day | ORAL | 5 refills | Status: AC

## 2024-05-04 LAB — URINE CULTURE, REFLEX

## 2024-05-04 LAB — UA/M W/RFLX CULTURE, ROUTINE
Bilirubin, UA: NEGATIVE
Glucose, UA: NEGATIVE
Ketones, UA: NEGATIVE
Nitrite, UA: POSITIVE — AB
Protein,UA: NEGATIVE
RBC, UA: NEGATIVE
Specific Gravity, UA: 1.01 (ref 1.005–1.030)
Urobilinogen, Ur: 0.2 mg/dL (ref 0.2–1.0)
pH, UA: 6 (ref 5.0–7.5)

## 2024-05-04 LAB — MICROSCOPIC EXAMINATION
Casts: NONE SEEN /LPF
RBC, Urine: NONE SEEN /HPF (ref 0–2)

## 2024-05-04 NOTE — Telephone Encounter (Signed)
 Patient picked up forms.

## 2024-05-05 MED ORDER — NITROFURANTOIN MONOHYD MACRO 100 MG PO CAPS: 100.0000 mg | ORAL_CAPSULE | Freq: Two times a day (BID) | ORAL | 0 refills | Status: AC

## 2024-05-18 ENCOUNTER — Other Ambulatory Visit: Payer: Self-pay

## 2024-05-18 ENCOUNTER — Encounter (HOSPITAL_COMMUNITY)
Admission: RE | Admit: 2024-05-18 | Discharge: 2024-05-18 | Disposition: A | Discharge status: Home / Self Care | Source: Ambulatory Visit | Attending: Gastroenterology | Admitting: Gastroenterology

## 2024-05-18 ENCOUNTER — Encounter (HOSPITAL_COMMUNITY): Payer: Self-pay

## 2024-05-18 NOTE — Pre-Procedure Instructions (Signed)
Attempted pre-op phone call. Left message for her to call us back. ?

## 2024-05-20 ENCOUNTER — Ambulatory Visit (HOSPITAL_COMMUNITY)
Admission: RE | Admit: 2024-05-20 | Discharge: 2024-05-20 | Disposition: A | Discharge status: Home / Self Care | Attending: Gastroenterology | Admitting: Gastroenterology

## 2024-05-20 ENCOUNTER — Encounter (HOSPITAL_COMMUNITY): Payer: Self-pay | Admitting: Gastroenterology

## 2024-05-20 ENCOUNTER — Other Ambulatory Visit: Payer: Self-pay

## 2024-05-20 ENCOUNTER — Ambulatory Visit (HOSPITAL_COMMUNITY): Admitting: Anesthesiology

## 2024-05-20 ENCOUNTER — Encounter (HOSPITAL_COMMUNITY)
Admission: RE | Disposition: A | Discharge status: Home / Self Care | Payer: Self-pay | Source: Home / Self Care | Attending: Gastroenterology

## 2024-05-20 ENCOUNTER — Ambulatory Visit (HOSPITAL_BASED_OUTPATIENT_CLINIC_OR_DEPARTMENT_OTHER): Admitting: Anesthesiology

## 2024-05-20 DIAGNOSIS — K648 Other hemorrhoids: Secondary | ICD-10-CM | POA: Insufficient documentation

## 2024-05-20 DIAGNOSIS — I10 Essential (primary) hypertension: Secondary | ICD-10-CM | POA: Insufficient documentation

## 2024-05-20 DIAGNOSIS — Z1211 Encounter for screening for malignant neoplasm of colon: Secondary | ICD-10-CM

## 2024-05-20 DIAGNOSIS — Z860100 Personal history of colon polyps, unspecified: Secondary | ICD-10-CM | POA: Insufficient documentation

## 2024-05-20 DIAGNOSIS — K5730 Diverticulosis of large intestine without perforation or abscess without bleeding: Secondary | ICD-10-CM | POA: Diagnosis not present

## 2024-05-20 DIAGNOSIS — Z860101 Personal history of adenomatous and serrated colon polyps: Secondary | ICD-10-CM

## 2024-05-20 HISTORY — PX: COLONOSCOPY: SHX5424

## 2024-05-20 LAB — HM COLONOSCOPY

## 2024-05-20 SURGERY — COLONOSCOPY
Anesthesia: General

## 2024-05-20 MED ORDER — LIDOCAINE 2% (20 MG/ML) 5 ML SYRINGE
INTRAMUSCULAR | Status: DC | PRN
  Administered 2024-05-20: 80 mg via INTRAVENOUS

## 2024-05-20 MED ORDER — PROPOFOL 500 MG/50ML IV EMUL
INTRAVENOUS | Status: DC | PRN
  Administered 2024-05-20: 125 ug/kg/min via INTRAVENOUS

## 2024-05-20 MED ORDER — STERILE WATER FOR IRRIGATION IR SOLN
Status: DC | PRN
  Administered 2024-05-20: 120 mL

## 2024-05-20 MED ORDER — LACTATED RINGERS IV SOLN: INTRAVENOUS | Status: DC | PRN

## 2024-05-20 MED ORDER — PROPOFOL 10 MG/ML IV BOLUS
INTRAVENOUS | Status: DC | PRN
  Administered 2024-05-20: 50 mg via INTRAVENOUS
  Administered 2024-05-20: 6 mg via INTRAVENOUS

## 2024-05-20 NOTE — H&P (Signed)
 Primary Care Physician:  Antonetta Rollene BRAVO, MD Primary Gastroenterologist:  Dr. Cinderella  Pre-Procedure History & Physical: HPI:  Yvonne Brewer is a 78 y.o. female is here for a for surveillance colonoscopy. She has history of colonic adenomas.   Patient denies any family history of colorectal cancer.  No melena or hematochezia.  No abdominal pain or unintentional weight loss.  No change in bowel habits.  Overall feels well from a GI standpoint.  2018  - Diverticulosis in the sigmoid colon and in the descending colon. - The examination was otherwise normal. - Internal hemorrhoids. - No specimens collected.  Past Medical History:  Diagnosis Date   Essential hypertension 2015   Female bladder prolapse    History of cystitis    History of skin cancer    Hyperlipidemia    Obesity     Past Surgical History:  Procedure Laterality Date   CATARACT EXTRACTION  2012   bilateral    COLONOSCOPY  11/06/2011   Procedure: COLONOSCOPY;  Surgeon: Claudis RAYMOND Rivet, MD;  [facility]: [facility];  Service: Endoscopy;  Laterality: N/A;  7:30   COLONOSCOPY N/A 05/21/2017   Procedure: COLONOSCOPY;  Surgeon: Rivet Claudis RAYMOND, MD;  Location: [facility];  Service: Endoscopy;  Laterality: N/A;  730-moved to 9/26 @10 :30am per [facility]   EYE SURGERY  2011   bilateral cataract with implants   HAMMER TOE SURGERY Left 01/2016   Left bunionectomy     Squamous cell ca rt ant chest 1989     YAG LASER APPLICATION Left 10/24/2015   Procedure: YAG LASER APPLICATION;  Surgeon: Oneil Platts, MD;  Location: [facility];  Service: Ophthalmology;  Laterality: Left;   YAG LASER APPLICATION Right 11/07/2015   Procedure: YAG LASER APPLICATION;  Surgeon: Oneil Platts, MD;  Location: [facility];  Service: Ophthalmology;  Laterality: Right;    Prior to Admission medications   Medication Sig Start Date End Date Taking? Authorizing Provider  omeprazole  (PRILOSEC) 20 MG capsule Take 20 mg by mouth daily.   Yes [provider]   triamterene -hydrochlorothiazide (MAXZIDE-25) 37.5-25 MG tablet Take 1 tablet by mouth as needed.   Yes [provider]  UNABLE TO FIND Med Name: Compression Hose 14-19 mmHg 03/12/24  Yes Antonetta Rollene BRAVO, MD  amLODipine  (NORVASC ) 2.5 MG tablet TAKE ONE TABLET (2.5MG  TOTAL) BY MOUTH DAILY 04/14/24   Antonetta Rollene BRAVO, MD  amLODipine  (NORVASC ) 5 MG tablet TAKE ONE TABLET (5MG  TOTAL) BY MOUTH DAILY 04/14/24   Antonetta Rollene BRAVO, MD  ezetimibe  (ZETIA ) 10 MG tablet Take 1 tablet (10 mg total) by mouth daily. 04/30/24   Antonetta Rollene BRAVO, MD  HYDROcodone -acetaminophen  (NORCO/VICODIN) 5-325 MG tablet One tablet every four hours for pain. Patient not taking: Reported on 03/19/2024 07/09/23   Brenna Lin, MD  Na Sulfate-K Sulfate-Mg Sulfate concentrate (SUPREP) 17.5-3.13-1.6 GM/177ML SOLN Take 1 kit (354 mLs total) by mouth as directed. 04/07/24   Mekesha Solomon, Deatrice FALCON, MD  nitrofurantoin , macrocrystal-monohydrate, (MACROBID ) 100 MG capsule Take 1 capsule (100 mg total) by mouth 2 (two) times daily. 05/05/24   Antonetta Rollene BRAVO, MD    Allergies as of 04/07/2024 - Review Complete 03/19/2024  Allergen Reaction Noted   Ace inhibitors Cough 06/02/2014   Penicillins Hives 12/31/2010   Statins Other (See Comments) 01/08/2022   Sulfonamide derivatives Hives     Family History  Problem Relation Age of Onset   Stroke Mother    Hypertension Mother    Heart disease Father  Cancer Maternal Grandmother    Anesthesia problems Neg Hx    Hypotension Neg Hx    Malignant hyperthermia Neg Hx    Pseudochol deficiency Neg Hx     Social History   Socioeconomic History   Marital status: Married    Spouse name: Lang    Number of children: 2   Years of education: 16   Highest education level: Master's degree (e.g., MA, MS, MEng, MEd, MSW, MBA)  Occupational History   Not on file  Tobacco Use   Smoking status: Never   Smokeless tobacco: Never  Vaping Use   Vaping status: Never Used   Substance and Sexual Activity   Alcohol use: Yes    Comment: glass of red wine most nights or scotch and water    Drug use: No   Sexual activity: Not Currently    Birth control/protection: Post-menopausal  Other Topics Concern   Not on file  Social History Narrative   Lives alone with  husband    Social Drivers of Health   Financial Resource Strain: Low Risk  (11/21/2022)   Overall Financial Resource Strain (CARDIA)    Difficulty of Paying Living Expenses: Not hard at all  Food Insecurity: No Food Insecurity (11/21/2022)   Hunger Vital Sign    Worried About Running Out of Food in the Last Year: Never true    Ran Out of Food in the Last Year: Never true  Transportation Needs: No Transportation Needs (11/21/2022)   PRAPARE - Administrator, Civil Service (Medical): No    Lack of Transportation (Non-Medical): No  Physical Activity: Sufficiently Active (11/21/2022)   Exercise Vital Sign    Days of Exercise per Week: 5 days    Minutes of Exercise per Session: 60 min  Stress: No Stress Concern Present (11/21/2022)   [employer] of Occupational Health - Occupational Stress Questionnaire    Feeling of Stress : Not at all  Social Connections: Socially Integrated (11/21/2022)   Social Connection and Isolation Panel    Frequency of Communication with Friends and Family: Twice a week    Frequency of Social Gatherings with Friends and Family: Twice a week    Attends Religious Services: More than 4 times per year    Active Member of Clubs or Organizations: Yes    Attends Banker Meetings: More than 4 times per year    Marital Status: Married  Catering manager Violence: Not At Risk (11/21/2022)   Humiliation, Afraid, Rape, and Kick questionnaire    Fear of Current or Ex-Partner: No    Emotionally Abused: No    Physically Abused: No    Sexually Abused: No    Review of Systems: See HPI, otherwise negative ROS  Physical Exam: Vital signs in last 24  hours: Temp:  [98.4 F (36.9 C)] 98.4 F (36.9 C) (09/25 0658) Pulse Rate:  [99] 99 (09/25 0658) Resp:  [16] 16 (09/25 0658) BP: (139-152)/(66-78) 139/78 (09/25 0700) SpO2:  [95 %] 95 % (09/25 0658) Weight:  [81.6 kg] 81.6 kg (09/25 0658)   General:   Alert,  Well-developed, well-nourished, pleasant and cooperative in NAD Head:  Normocephalic and atraumatic. Eyes:  Sclera clear, no icterus.   Conjunctiva pink. Ears:  Normal auditory acuity. Nose:  No deformity, discharge,  or lesions. Msk:  Symmetrical without gross deformities. Normal posture. Extremities:  Without clubbing or edema. Neurologic:  Alert and  oriented x4;  grossly normal neurologically. Skin:  Intact without significant lesions or rashes. Psych:  Alert and cooperative. Normal mood and affect.  Impression/Plan: Yvonne Brewer is a 78 y.o. female is here for a for surveillance colonoscopy  The risks of the procedure including infection, bleed, or perforation as well as benefits, limitations, alternatives and imponderables have been reviewed with the patient. Questions have been answered. All parties agreeable.

## 2024-05-20 NOTE — Op Note (Signed)
 [facility] Patient Name: Yvonne Brewer Procedure Date: 05/20/2024 7:07 AM MRN: [id number] Date of Birth: [birth year] Attending MD: Deatrice Dine , MD, [id number] CSN: [id number] Age: 78 Admit Type: Outpatient Procedure:                Colonoscopy Indications:              High risk colon cancer surveillance: Personal                            history of colonic polyps Providers:                Deatrice Dine, MD, Madelin Hunter, RN, [place] Wilson,                            Technician Referring MD:              Medicines:                Monitored Anesthesia Care Complications:            No immediate complications. Estimated Blood Loss:     Estimated blood loss: none. Procedure:                Pre-Anesthesia Assessment:                           - Prior to the procedure, a History and Physical                            was performed, and patient medications and                            allergies were reviewed. The patient's tolerance of                            previous anesthesia was also reviewed. The risks                            and benefits of the procedure and the sedation                            options and risks were discussed with the patient.                            All questions were answered, and informed consent                            was obtained. Prior Anticoagulants: The patient has                            taken no anticoagulant or antiplatelet agents                            except for aspirin. ASA Grade Assessment: II - A  patient with mild systemic disease. After reviewing                            the risks and benefits, the patient was deemed in                            satisfactory condition to undergo the procedure.                           After obtaining informed consent, the colonoscope                            was passed under direct vision. Throughout the                            procedure, the  patient's blood pressure, pulse, and                            oxygen saturations were monitored continuously. The                            [id number] ([id number]) Colon was introduced through                            the anus and advanced to the the cecum, identified                            by appendiceal orifice and ileocecal valve. The                            ileocecal valve, appendiceal orifice, and rectum                            were photographed. Scope In: 8:02:33 AM Scope Out: 8:15:17 AM Scope Withdrawal Time: 0 hours 10 minutes 33 seconds  Total Procedure Duration: 0 hours 12 minutes 44 seconds  Findings:      The perianal and digital rectal examinations were normal.      Scattered medium-mouthed diverticula were found in the entire colon.      Non-bleeding internal hemorrhoids were found during retroflexion. The       hemorrhoids were small. Impression:               - Diverticulosis in the entire examined colon.                           - Non-bleeding internal hemorrhoids.                           - No specimens collected. Moderate Sedation:      Per Anesthesia Care Recommendation:           - Patient has a contact number available for                            emergencies. The signs and symptoms of potential  delayed complications were discussed with the                            patient. Return to normal activities tomorrow.                            Written discharge instructions were provided to the                            patient.                           - Resume previous diet.                           - Continue present medications.                           - Await pathology results.                           - No repeat colonoscopy due to current age (71                            years or older).                           - Return to primary care physician as previously                            scheduled. Procedure  Code(s):        --- Professional ---                           [phone], Colonoscopy, flexible; diagnostic, including                            collection of specimen(s) by brushing or washing,                            when performed (separate procedure) Diagnosis Code(s):        --- Professional ---                           Z86.010, Personal history of colonic polyps                           K64.8, Other hemorrhoids                           K57.30, Diverticulosis of large intestine without                            perforation or abscess without bleeding CPT copyright 2022 American Medical Association. All rights reserved. The codes documented in this report are preliminary and upon coder review may  be revised to meet current compliance requirements. Evelene Roussin, MD Deatrice Dine, MD  05/20/2024 8:20:22 AM This report has been signed electronically. Number of Addenda: 0

## 2024-05-20 NOTE — Transfer of Care (Signed)
 Immediate Anesthesia Transfer of Care Note  Patient: Yvonne Brewer  Procedure(s) Performed: COLONOSCOPY  Patient Location: PACU  Anesthesia Type:MAC  Level of Consciousness: awake and alert   Airway & Oxygen Therapy: Patient Spontanous Breathing and Patient connected to nasal cannula oxygen  Post-op Assessment: Report given to RN and Post -op Vital signs reviewed and stable  Post vital signs: Reviewed and stable  Last Vitals:  Vitals Value Taken Time  BP    Temp    Pulse    Resp    SpO2      Last Pain:  Vitals:   05/20/24 0751  TempSrc:   PainSc: 0-No pain         Complications: No notable events documented.

## 2024-05-20 NOTE — Anesthesia Preprocedure Evaluation (Signed)
 Anesthesia Evaluation  Patient identified by MRN, date of birth, ID band Patient awake    Reviewed: Allergy & Precautions, H&P , NPO status , Patient's Chart, lab work & pertinent test results, reviewed documented beta blocker date and time   Airway Mallampati: II  TM Distance: >3 FB Neck ROM: full    Dental no notable dental hx.    Pulmonary neg pulmonary ROS   Pulmonary exam normal breath sounds clear to auscultation       Cardiovascular Exercise Tolerance: Good hypertension,  Rhythm:regular Rate:Normal     Neuro/Psych  Neuromuscular disease  negative psych ROS   GI/Hepatic negative GI ROS, Neg liver ROS,,,  Endo/Other  negative endocrine ROS    Renal/GU negative Renal ROS  negative genitourinary   Musculoskeletal   Abdominal   Peds  Hematology negative hematology ROS (+)   Anesthesia Other Findings   Reproductive/Obstetrics negative OB ROS                              Anesthesia Physical Anesthesia Plan  ASA: 2  Anesthesia Plan: General   Post-op Pain Management:    Induction:   PONV Risk Score and Plan: Propofol  infusion  Airway Management Planned:   Additional Equipment:   Intra-op Plan:   Post-operative Plan:   Informed Consent: I have reviewed the patients History and Physical, chart, labs and discussed the procedure including the risks, benefits and alternatives for the proposed anesthesia with the patient or authorized representative who has indicated his/her understanding and acceptance.     Dental Advisory Given  Plan Discussed with: CRNA  Anesthesia Plan Comments:         Anesthesia Quick Evaluation

## 2024-05-20 NOTE — Discharge Instructions (Addendum)

## 2024-05-20 NOTE — Anesthesia Postprocedure Evaluation (Signed)
 Anesthesia Post Note  Patient: Yvonne Brewer  Procedure(s) Performed: COLONOSCOPY  Patient location during evaluation: Phase II Anesthesia Type: General Level of consciousness: awake Pain management: pain level controlled Vital Signs Assessment: post-procedure vital signs reviewed and stable Respiratory status: spontaneous breathing and respiratory function stable Cardiovascular status: blood pressure returned to baseline and stable Postop Assessment: no headache and no apparent nausea or vomiting Anesthetic complications: no Comments: Late entry   No notable events documented.   Last Vitals:  Vitals:   05/20/24 0821 05/20/24 0835  BP: (!) 139/108 (!) 117/56  Pulse: 75   Resp: (!) 21   Temp: 36.6 C   SpO2: 100%     Last Pain:  Vitals:   05/20/24 0821  TempSrc: Axillary  PainSc: 0-No pain                 [name]

## 2024-05-21 ENCOUNTER — Encounter (HOSPITAL_COMMUNITY): Payer: Self-pay | Admitting: Gastroenterology

## 2024-05-21 ENCOUNTER — Encounter (INDEPENDENT_AMBULATORY_CARE_PROVIDER_SITE_OTHER): Payer: Self-pay | Admitting: *Deleted

## 2024-05-25 ENCOUNTER — Ambulatory Visit (INDEPENDENT_AMBULATORY_CARE_PROVIDER_SITE_OTHER)

## 2024-05-25 DIAGNOSIS — Z23 Encounter for immunization: Secondary | ICD-10-CM

## 2024-05-25 NOTE — Progress Notes (Signed)
 Patient is in office today for a nurse visit for Immunization. Patient Injection was given in the  Left deltoid. Patient tolerated injection well.

## 2024-06-11 ENCOUNTER — Telehealth: Payer: Self-pay | Admitting: Family Medicine

## 2024-06-11 ENCOUNTER — Other Ambulatory Visit: Payer: Self-pay

## 2024-06-11 MED ORDER — AMLODIPINE BESYLATE 5 MG PO TABS: 5.0000 mg | ORAL_TABLET | Freq: Every day | ORAL | 2 refills | Status: DC

## 2024-06-11 MED ORDER — AMLODIPINE BESYLATE 2.5 MG PO TABS: 2.5000 mg | ORAL_TABLET | Freq: Every day | ORAL | 2 refills | Status: DC

## 2024-06-11 NOTE — Telephone Encounter (Signed)
 Prescription Request  06/11/2024  LOV: 03/19/2024  What is the name of the medication or equipment? amLODipine  (NORVASC ) 2.5 MG tablet [[id number]   amLODipine  (NORVASC ) 5 MG tablet [[id number]    Have you contacted your pharmacy to request a refill? Yes   Which pharmacy would you like this sent to?  [facility] - [place], [place] - [address] [place] [address] Phone: [phone] Fax: [phone]    Patient notified that their request is being sent to the clinical staff for review and that they should receive a response within 2 business days.   Please advise at walked into office

## 2024-06-11 NOTE — Telephone Encounter (Signed)
 Sent!

## 2024-06-22 ENCOUNTER — Ambulatory Visit

## 2024-06-22 VITALS — Ht 63.0 in | Wt 170.0 lb

## 2024-06-22 DIAGNOSIS — Z0000 Encounter for general adult medical examination without abnormal findings: Secondary | ICD-10-CM

## 2024-06-22 DIAGNOSIS — Z780 Asymptomatic menopausal state: Secondary | ICD-10-CM

## 2024-06-22 NOTE — Progress Notes (Signed)
 Please attest and cosign this visit due to patients primary care provider not being in the office at the time the visit was completed.  Subjective:   Yvonne Brewer is a 78 y.o. who presents for a Medicare Wellness preventive visit.  As a reminder, Annual Wellness Visits don't include a physical exam, and some assessments may be limited, especially if this visit is performed virtually. We may recommend an in-person follow-up visit with your provider if needed.  Visit Complete: Virtual I connected with  Yvonne Brewer on 06/22/24 by a audio enabled telemedicine application and verified that I am speaking with the correct person using two identifiers.  Patient Location: Home  Provider Location: Office/Clinic  I discussed the limitations of evaluation and management by telemedicine. The patient expressed understanding and agreed to proceed.  Vital Signs: Because this visit was a virtual/telehealth visit, some criteria may be missing or patient reported. Any vitals not documented were not able to be obtained and vitals that have been documented are patient reported.  VideoDeclined- This patient declined Librarian, academic. Therefore the visit was completed with audio only.  Persons Participating in Visit: Patient.  AWV Questionnaire: No: Patient Medicare AWV questionnaire was not completed prior to this visit.        Objective:    Today's Vitals   06/22/24 1024  Weight: 170 lb (77.1 kg)  Height: 5' 3 (1.6 m)  PainSc: 0-No pain   Body mass index is 30.11 kg/m.     06/22/2024   10:22 AM 05/20/2024    6:46 AM 05/18/2024   10:40 AM 08/13/2023   12:14 PM 11/21/2022    1:24 PM 11/19/2021   11:36 AM 11/19/2021   11:35 AM  Advanced Directives  Does Patient Have a Medical Advance Directive? No No No Yes No Yes No  Type of Advance Directive      Healthcare Power of Attorney   Does patient want to make changes to medical advance directive?    No -  Patient declined  No - Patient declined   Copy of Healthcare Power of Attorney in Chart?      No - copy requested   Would patient like information on creating a medical advance directive? No - Patient declined No - Patient declined No - Patient declined  No - Patient declined No - Patient declined No - Patient declined    Current Medications (verified) Outpatient Encounter Medications as of 06/22/2024  Medication Sig   amLODipine  (NORVASC ) 2.5 MG tablet Take 1 tablet (2.5 mg total) by mouth daily.   amLODipine  (NORVASC ) 5 MG tablet Take 1 tablet (5 mg total) by mouth daily.   ezetimibe  (ZETIA ) 10 MG tablet Take 1 tablet (10 mg total) by mouth daily. (Patient not taking: Reported on 06/22/2024)   HYDROcodone -acetaminophen  (NORCO/VICODIN) 5-325 MG tablet One tablet every four hours for pain. (Patient not taking: Reported on 06/22/2024)   nitrofurantoin , macrocrystal-monohydrate, (MACROBID ) 100 MG capsule Take 1 capsule (100 mg total) by mouth 2 (two) times daily. (Patient not taking: Reported on 06/22/2024)   omeprazole  (PRILOSEC) 20 MG capsule Take 20 mg by mouth daily. (Patient not taking: Reported on 06/22/2024)   triamterene -hydrochlorothiazide (MAXZIDE-25) 37.5-25 MG tablet Take 1 tablet by mouth as needed. (Patient not taking: Reported on 06/22/2024)   UNABLE TO FIND Med Name: Compression Hose 14-19 mmHg (Patient not taking: Reported on 06/22/2024)   No facility-administered encounter medications on file as of 06/22/2024.    Allergies (verified) Ace inhibitors,  Penicillins, Statins, and Sulfonamide derivatives   History: Past Medical History:  Diagnosis Date   Essential hypertension 2015   Female bladder prolapse    History of cystitis    History of skin cancer    Hyperlipidemia    Obesity    Past Surgical History:  Procedure Laterality Date   CATARACT EXTRACTION  2012   bilateral    COLONOSCOPY  11/06/2011   Procedure: COLONOSCOPY;  Surgeon: Claudis RAYMOND Rivet, MD;  [facility];  Service: Endoscopy;  Laterality: N/A;  7:30   COLONOSCOPY N/A 05/21/2017   Procedure: COLONOSCOPY;  Surgeon: Rivet Claudis RAYMOND, MD;  [facility]: [facility];  Service: Endoscopy;  Laterality: N/A;  730-moved to 9/26 @10 :30am per Ann   COLONOSCOPY N/A 05/20/2024   Procedure: COLONOSCOPY;  Surgeon: Cinderella Deatrice FALCON, MD;  Location: [facility];  Service: Endoscopy;  Laterality: N/A;  830am, asa 1-2   EYE SURGERY  2011   bilateral cataract with implants   HAMMER TOE SURGERY Left 01/2016   Left bunionectomy     Squamous cell ca rt ant chest 1989     YAG LASER APPLICATION Left 10/24/2015   Procedure: YAG LASER APPLICATION;  Surgeon: Oneil Platts, MD;  Location: [facility];  Service: Ophthalmology;  Laterality: Left;   YAG LASER APPLICATION Right 11/07/2015   Procedure: YAG LASER APPLICATION;  Surgeon: Oneil Platts, MD;  Location: [facility];  Service: Ophthalmology;  Laterality: Right;   Family History  Problem Relation Age of Onset   Stroke Mother    Hypertension Mother    Heart disease Father    Cancer Maternal Grandmother    Anesthesia problems Neg Hx    Hypotension Neg Hx    Malignant hyperthermia Neg Hx    Pseudochol deficiency Neg Hx    Social History   Socioeconomic History   Marital status: Married    Spouse name: Lang    Number of children: 2   Years of education: 16   Highest education level: Master's degree (e.g., MA, MS, MEng, MEd, MSW, MBA)  Occupational History   Not on file  Tobacco Use   Smoking status: Never   Smokeless tobacco: Never  Vaping Use   Vaping status: Never Used  Substance and Sexual Activity   Alcohol use: Yes    Comment: glass of red wine most nights or scotch and water    Drug use: No   Sexual activity: Not Currently    Birth control/protection: Post-menopausal  Other Topics Concern   Not on file  Social History Narrative   Lives alone with  husband    Social Drivers of Health   Financial Resource Strain: Low Risk  (06/22/2024)    Overall Financial Resource Strain (CARDIA)    Difficulty of Paying Living Expenses: Not hard at all  Food Insecurity: No Food Insecurity (06/22/2024)   Hunger Vital Sign    Worried About Running Out of Food in the Last Year: Never true    Ran Out of Food in the Last Year: Never true  Transportation Needs: No Transportation Needs (06/22/2024)   PRAPARE - Administrator, Civil Service (Medical): No    Lack of Transportation (Non-Medical): No  Physical Activity: Sufficiently Active (06/22/2024)   Exercise Vital Sign    Days of Exercise per Week: 7 days    Minutes of Exercise per Session: 30 min  Stress: No Stress Concern Present (06/22/2024)   [employer] of Occupational Health - Occupational Stress Questionnaire  Feeling of Stress: Not at all  Social Connections: Socially Integrated (06/22/2024)   Social Connection and Isolation Panel    Frequency of Communication with Friends and Family: Twice a week    Frequency of Social Gatherings with Friends and Family: Twice a week    Attends Religious Services: More than 4 times per year    Active Member of [employer] or Organizations: Yes    Attends Engineer, Structural: More than 4 times per year    Marital Status: Married    Tobacco Counseling Counseling given: Yes    Clinical Intake:  Pre-visit preparation completed: Yes  Pain : No/denies pain Pain Score: 0-No pain     BMI - recorded: 30.11 Nutritional Status: BMI > 30  Obese Nutritional Risks: None Diabetes: No  Lab Results  Component Value Date   HGBA1C 5.6 04/29/2024   HGBA1C 5.7 (H) 06/25/2023   HGBA1C 5.8 (H) 10/03/2022     How often do you need to have someone help you when you read instructions, pamphlets, or other written materials from your doctor or pharmacy?: 1 - Never  Interpreter Needed?: No  Information entered by :: Shalea Tomczak W CMA (AAMA)   Activities of Daily Living     05/18/2024   10:44 AM 05/18/2024   10:40 AM  In your  present state of health, do you have any difficulty performing the following activities:  Hearing?  0  Vision?  0  Difficulty concentrating or making decisions?  0  Doing errands, shopping? 0     Patient Care Team: Antonetta Rollene BRAVO, MD as PCP - General (Family Medicine) Shona Rush, MD (Dermatology) Roz Anes, MD as Consulting Physician (Ophthalmology) Kit Rush, MD as Consulting Physician (Orthopedic Surgery)  I have updated your Care Teams any recent Medical Services you may have received from other providers in the past year.     Assessment:   This is a routine wellness examination for [facility].  Hearing/Vision screen Hearing Screening - Comments:: Patient denies any hearing difficulties.   Vision Screening - Comments:: Patient wears reading glasses only. Up to date with yearly exams.  Anes Kawasaki w/ [facility]    Goals Addressed               This Visit's Progress     I'd like to lose 10 lbs (pt-stated)          Depression Screen     06/22/2024   10:37 AM 03/19/2024    9:43 AM 06/25/2023    8:28 AM 03/20/2023    9:56 AM 03/10/2023    2:59 PM 11/28/2022   10:11 AM 11/21/2022    1:23 PM  PHQ 2/9 Scores  PHQ - 2 Score 0 0 0 0 0 0 1  PHQ- 9 Score 0     0      Fall Risk     06/22/2024   10:30 AM 03/19/2024    9:43 AM 06/25/2023    8:27 AM 03/20/2023    9:55 AM 03/10/2023    2:59 PM  Fall Risk   Falls in the past year? 1 1 0 0 0  Number falls in past yr: 0 0 0 0 0  Injury with Fall? 1 1 0 0 0  Risk for fall due to : No Fall Risks;Orthopedic patient;History of fall(s)  No Fall Risks No Fall Risks   Follow up Falls evaluation completed;Education provided;Falls prevention discussed Falls evaluation completed Falls evaluation completed Falls evaluation completed  MEDICARE RISK AT HOME:  Medicare Risk at Home Any stairs in or around the home?: Yes If so, are there any without handrails?: No Home free of loose throw rugs in  walkways, pet beds, electrical cords, etc?: Yes Adequate lighting in your home to reduce risk of falls?: Yes Life alert?: No Use of a cane, walker or w/c?: No Grab bars in the bathroom?: Yes Shower chair or bench in shower?: Yes Elevated toilet seat or a handicapped toilet?: No  TIMED UP AND GO:  Was the test performed?  No  Cognitive Function: 6CIT completed    07/29/2016    9:44 AM  MMSE - Mini Mental State Exam  Orientation to time 5   Orientation to Place 5   Registration 3   Attention/ Calculation 5   Recall 3   Language- name 2 objects 2   Language- repeat 1  Language- follow 3 step command 3   Language- read & follow direction 1   Write a sentence 1   Copy design 1   Total score 30      Data saved with a previous flowsheet row definition        06/22/2024   10:31 AM 11/21/2022    1:25 PM 11/19/2021   11:43 AM 08/15/2020   10:56 AM 08/12/2019    8:52 AM  6CIT Screen  What Year? 0 points  0 points 0 points 0 points  What month? 0 points  0 points 0 points 0 points  What time? 0 points 0 points 0 points 0 points 0 points  Count back from 20 0 points 0 points 0 points 0 points 0 points  Months in reverse 0 points 0 points 0 points 0 points 0 points  Repeat phrase 0 points 0 points 2 points 0 points 0 points  Total Score 0 points  2 points 0 points 0 points    Immunizations Immunization History  Administered Date(s) Administered    sv, Bivalent, Protein Subunit Rsvpref,pf (Abrysvo) 08/30/2022   Fluad Quad(high Dose 65+) 04/19/2019, 06/01/2020   Fluad Trivalent(High Dose 65+) 05/26/2023   Hep A / Hep B 03/09/2018, 03/09/2018   Hepatitis A 12/29/2002, 09/20/2003   INFLUENZA, HIGH DOSE SEASONAL PF 05/02/2021, 05/25/2024   Influenza Split 05/23/2014   Influenza Whole 10/13/2008, 06/06/2009   Influenza-Unspecified 05/16/2017, 05/26/2018, 06/08/2022   Moderna Covid-19 Fall Seasonal Vaccine 17yrs & older 05/08/2023   Moderna Covid-19 Vaccine Bivalent Booster  75yrs & up 05/21/2022   PFIZER(Purple Top)SARS-COV-2 Vaccination 09/16/2019, 10/07/2019, 05/23/2020   Pneumococcal Conjugate-13 09/06/2014   Pneumococcal Polysaccharide-23 12/31/2010   Td 12/15/2002   Tdap 07/29/2012   Typhoid Live 03/09/2018, 03/09/2018   Zoster Recombinant(Shingrix) 07/09/2019, 07/09/2019, 11/18/2019   Zoster, Live 09/01/2006    Screening Tests Health Maintenance  Topic Date Due   DEXA SCAN  04/16/2020   DTaP/Tdap/Td (3 - Td or Tdap) 07/29/2022   COVID-19 Vaccine (6 - 2025-26 season) 04/26/2024   Mammogram  10/29/2024   Medicare Annual Wellness (AWV)  06/22/2025   Colonoscopy  05/21/2031   Pneumococcal Vaccine: 50+ Years  Completed   Influenza Vaccine  Completed   Hepatitis C Screening  Completed   Zoster Vaccines- Shingrix  Completed   Meningococcal B Vaccine  Aged Out   Hepatitis B Vaccines 19-59 Average Risk  Discontinued    Health Maintenance Health Maintenance Due  Topic Date Due   DEXA SCAN  04/16/2020   DTaP/Tdap/Td (3 - Td or Tdap) 07/29/2022   COVID-19 Vaccine (6 - 2025-26  season) 04/26/2024   Health Maintenance Items Addressed: DEXA ordered  Additional Screening:  Vision Screening: Recommended annual ophthalmology exams for early detection of glaucoma and other disorders of the eye. Would you like a referral to an eye doctor? No    Dental Screening: Recommended annual dental exams for proper oral hygiene  Community Resource Referral / Chronic Care Management: CRR required this visit?  No   CCM required this visit?  No   Plan:    I have personally reviewed and noted the following in the patient's chart:   Medical and social history Use of alcohol, tobacco or illicit drugs  Current medications and supplements including opioid prescriptions. Patient is not currently taking opioid prescriptions. Functional ability and status Nutritional status Physical activity Advanced directives List of other physicians Hospitalizations,  surgeries, and ER visits in previous 12 months Vitals Screenings to include cognitive, depression, and falls Referrals and appointments  In addition, I have reviewed and discussed with patient certain preventive protocols, quality metrics, and best practice recommendations. A written personalized care plan for preventive services as well as general preventive health recommendations were provided to patient.   Aziah Kaiser, CMA   06/22/2024   After Visit Summary: (MyChart) Due to this being a telephonic visit, the after visit summary with patients personalized plan was offered to patient via MyChart   Notes: Nothing significant to report at this time.

## 2024-06-22 NOTE — Patient Instructions (Addendum)
 [name],  Thank you for taking the time for your Medicare Wellness Visit. I appreciate your continued commitment to your health goals. Please review the care plan we discussed, and feel free to reach out if I can assist you further.  Medicare recommends these wellness visits once per year to help you and your care team stay ahead of potential health issues. These visits are designed to focus on prevention, allowing your provider to concentrate on managing your acute and chronic conditions during your regular appointments.  Please note that Annual Wellness Visits do not include a physical exam. Some assessments may be limited, especially if the visit was conducted virtually. If needed, we may recommend a separate in-person follow-up with your provider.  Wishing you excellent health and many blessings in the year to come!  -Jodie Leiner, CMA  Ongoing Care Seeing your primary care provider every 3 to 6 months helps us  monitor your health and provide consistent, personalized care.   Recommended Screenings:  Osteoporosis Screening An order was placed for you to have your Osteoporosis Screening. Call the number below to schedule that [facility]  [phone]   Health Maintenance  Topic Date Due   DEXA scan (bone density measurement)  04/16/2020   DTaP/Tdap/Td vaccine (3 - Td or Tdap) 07/29/2022   COVID-19 Vaccine (6 - 2025-26 season) 04/26/2024   Breast Cancer Screening  10/29/2024   Medicare Annual Wellness Visit  06/22/2025   Colon Cancer Screening  05/21/2031   Pneumococcal Vaccine for age over 38  Completed   Flu Shot  Completed   Hepatitis C Screening  Completed   Zoster (Shingles) Vaccine  Completed   Meningitis B Vaccine  Aged Out   Hepatitis B Vaccine  Discontinued       06/22/2024   10:22 AM  Advanced Directives  Does Patient Have a Medical Advance Directive? No  Would patient like information on creating a medical advance directive? No - Patient declined   Advance Care  Planning is important because it: Ensures you receive medical care that aligns with your values, goals, and preferences. Provides guidance to your family and loved ones, reducing the emotional burden of decision-making during critical moments.  Vision: Annual vision screenings are recommended for early detection of glaucoma, cataracts, and diabetic retinopathy. These exams can also reveal signs of chronic conditions such as diabetes and high blood pressure.  Dental: Annual dental screenings help detect early signs of oral cancer, gum disease, and other conditions linked to overall health, including heart disease and diabetes.  Please see the attached documents for additional preventive care recommendations.

## 2024-06-28 ENCOUNTER — Encounter: Payer: Self-pay | Admitting: Radiology

## 2024-07-02 ENCOUNTER — Other Ambulatory Visit (HOSPITAL_COMMUNITY)

## 2024-07-05 ENCOUNTER — Ambulatory Visit: Payer: Self-pay | Admitting: Internal Medicine

## 2024-07-05 ENCOUNTER — Ambulatory Visit (HOSPITAL_COMMUNITY)
Admission: RE | Admit: 2024-07-05 | Discharge: 2024-07-05 | Disposition: A | Discharge status: Home / Self Care | Source: Ambulatory Visit | Attending: Internal Medicine | Admitting: Internal Medicine

## 2024-07-05 DIAGNOSIS — Z780 Asymptomatic menopausal state: Secondary | ICD-10-CM | POA: Insufficient documentation

## 2024-09-21 ENCOUNTER — Telehealth: Payer: Self-pay

## 2024-09-21 ENCOUNTER — Ambulatory Visit: Admitting: Family Medicine

## 2024-09-21 ENCOUNTER — Other Ambulatory Visit: Payer: Self-pay

## 2024-09-21 MED ORDER — AMLODIPINE BESYLATE 5 MG PO TABS: 5.0000 mg | ORAL_TABLET | Freq: Every day | ORAL | 2 refills | Status: DC

## 2024-09-21 MED ORDER — AMLODIPINE BESYLATE 2.5 MG PO TABS: 2.5000 mg | ORAL_TABLET | Freq: Every day | ORAL | 2 refills | Status: DC

## 2024-09-21 NOTE — Telephone Encounter (Signed)
 Copied from CRM #[id number]. Topic: Clinical - Medication Refill >> Sep 21, 2024  9:37 AM Kevelyn M wrote: Medication: amLODipine  (NORVASC ) 2.5 MG tablet, amLODipine  (NORVASC ) 5 MG tablet  Patient is requesting more refills, because it says it expires in June 11, 2025. Patient is in the process of finding a new doctor since she's moved out of the area.   Has the patient contacted their pharmacy? Yes (Agent: If no, request that the patient contact the pharmacy for the refill. If patient does not wish to contact the pharmacy document the reason why and proceed with request.) (Agent: If yes, when and what did the pharmacy advise?)  This is the patient's preferred pharmacy:  [facility] - [place], [place] - [address] [place] [address] Phone: [phone] Fax: [phone]  Is this the correct pharmacy for this prescription? Yes If no, delete pharmacy and type the correct one.   Has the prescription been filled recently? No  Is the patient out of the medication? No  Has the patient been seen for an appointment in the last year OR does the patient have an upcoming appointment? Yes  Can we respond through MyChart? No  Agent: Please be advised that Rx refills may take up to 3 business days. We ask that you follow-up with your pharmacy.

## 2024-09-21 NOTE — Telephone Encounter (Signed)
 Sent!

## 2024-09-23 ENCOUNTER — Other Ambulatory Visit: Payer: Self-pay | Admitting: Family Medicine

## 2024-09-24 ENCOUNTER — Telehealth: Payer: Self-pay

## 2024-09-24 MED ORDER — AMLODIPINE BESYLATE 2.5 MG PO TABS: 2.5000 mg | ORAL_TABLET | Freq: Every day | ORAL | 2 refills | Status: AC

## 2024-09-24 MED ORDER — AMLODIPINE BESYLATE 5 MG PO TABS: 5.0000 mg | ORAL_TABLET | Freq: Every day | ORAL | 2 refills | Status: AC

## 2024-09-24 NOTE — Telephone Encounter (Signed)
 Copied from CRM #[id number]. Topic: Clinical - Prescription Issue >> Sep 23, 2024  5:24 PM Rexford HERO wrote: Reason for CRM: Patient is calling because she needs her amLODipine  (NORVASC ) 2.5 MG tablet, and amLODipine  (NORVASC ) 5 MG tablet sent to [facility] [place] [facility] because she is out town. She needs it because she is all out of this medication.   [facility] [address] [place], [address]  Phone number # [phone] Fax # [phone]

## 2024-09-24 NOTE — Telephone Encounter (Signed)
Amlodipine is sent.

## 2024-09-24 NOTE — Addendum Note (Signed)
 Addended by: ANTONETTA ROLLENE BRAVO on: 09/24/2024 12:36 PM   Modules accepted: Orders
